# Patient Record
Sex: Male | Born: 1955
Health system: Southern US, Community
[De-identification: ages and names within clinical notes are randomized; demographics above are authoritative.]

## PROBLEM LIST (undated history)

## (undated) DIAGNOSIS — K635 Polyp of colon: Secondary | ICD-10-CM

## (undated) DIAGNOSIS — I10 Essential (primary) hypertension: Secondary | ICD-10-CM

## (undated) DIAGNOSIS — C20 Malignant neoplasm of rectum: Secondary | ICD-10-CM

## (undated) DIAGNOSIS — K219 Gastro-esophageal reflux disease without esophagitis: Secondary | ICD-10-CM

## (undated) DIAGNOSIS — E119 Type 2 diabetes mellitus without complications: Secondary | ICD-10-CM

## (undated) HISTORY — DX: Polyp of colon: K63.5

## (undated) HISTORY — DX: Malignant neoplasm of rectum: C20

## (undated) HISTORY — PX: OTHER SURGICAL HISTORY: SHX169

---

## 2001-10-13 ENCOUNTER — Emergency Department (HOSPITAL_COMMUNITY): Admission: EM | Admit: 2001-10-13 | Discharge: 2001-10-13 | Payer: Self-pay | Admitting: *Deleted

## 2004-11-30 ENCOUNTER — Emergency Department: Payer: Self-pay | Admitting: Emergency Medicine

## 2008-06-13 ENCOUNTER — Emergency Department: Payer: Self-pay | Admitting: Unknown Physician Specialty

## 2009-10-26 ENCOUNTER — Emergency Department: Payer: Self-pay | Admitting: Emergency Medicine

## 2010-03-02 ENCOUNTER — Emergency Department: Payer: Self-pay | Admitting: Emergency Medicine

## 2010-08-25 ENCOUNTER — Emergency Department: Payer: Self-pay | Admitting: Emergency Medicine

## 2011-02-09 ENCOUNTER — Emergency Department: Payer: Self-pay | Admitting: Unknown Physician Specialty

## 2011-10-10 ENCOUNTER — Emergency Department: Payer: Self-pay | Admitting: Emergency Medicine

## 2011-10-10 LAB — CBC
HCT: 46.9 % (ref 40.0–52.0)
MCH: 27.6 pg (ref 26.0–34.0)
MCHC: 31.9 g/dL — ABNORMAL LOW (ref 32.0–36.0)
MCV: 87 fL (ref 80–100)
Platelet: 159 10*3/uL (ref 150–440)
RDW: 14.3 % (ref 11.5–14.5)
WBC: 7 10*3/uL (ref 3.8–10.6)

## 2011-10-10 LAB — BASIC METABOLIC PANEL
Anion Gap: 5 — ABNORMAL LOW (ref 7–16)
Calcium, Total: 9.2 mg/dL (ref 8.5–10.1)
Co2: 35 mmol/L — ABNORMAL HIGH (ref 21–32)
EGFR (African American): >60
EGFR (Non-African Amer.): 55 — ABNORMAL LOW
Glucose: 106 mg/dL — ABNORMAL HIGH (ref 65–99)
Osmolality: 289 (ref 275–301)
Potassium: 4.3 mmol/L (ref 3.5–5.1)

## 2011-10-10 LAB — TROPONIN I: Troponin-I: <0.02 ng/mL

## 2014-09-20 ENCOUNTER — Encounter: Payer: Self-pay | Admitting: *Deleted

## 2014-09-21 ENCOUNTER — Encounter
Admission: RE | Disposition: A | Discharge status: Home / Self Care | Payer: Self-pay | Source: Ambulatory Visit | Attending: Gastroenterology

## 2014-09-21 ENCOUNTER — Ambulatory Visit
Admission: RE | Admit: 2014-09-21 | Discharge: 2014-09-21 | Disposition: A | Discharge status: Home / Self Care | Payer: PRIVATE HEALTH INSURANCE | Source: Ambulatory Visit | Attending: Gastroenterology | Admitting: Gastroenterology

## 2014-09-21 ENCOUNTER — Ambulatory Visit: Payer: PRIVATE HEALTH INSURANCE | Admitting: Anesthesiology

## 2014-09-21 ENCOUNTER — Encounter: Payer: Self-pay | Admitting: *Deleted

## 2014-09-21 DIAGNOSIS — Z1211 Encounter for screening for malignant neoplasm of colon: Secondary | ICD-10-CM | POA: Diagnosis not present

## 2014-09-21 DIAGNOSIS — B9681 Helicobacter pylori [H. pylori] as the cause of diseases classified elsewhere: Secondary | ICD-10-CM | POA: Diagnosis not present

## 2014-09-21 DIAGNOSIS — D123 Benign neoplasm of transverse colon: Secondary | ICD-10-CM | POA: Insufficient documentation

## 2014-09-21 DIAGNOSIS — I10 Essential (primary) hypertension: Secondary | ICD-10-CM | POA: Diagnosis not present

## 2014-09-21 DIAGNOSIS — K219 Gastro-esophageal reflux disease without esophagitis: Secondary | ICD-10-CM | POA: Diagnosis not present

## 2014-09-21 DIAGNOSIS — F17210 Nicotine dependence, cigarettes, uncomplicated: Secondary | ICD-10-CM | POA: Insufficient documentation

## 2014-09-21 DIAGNOSIS — K2950 Unspecified chronic gastritis without bleeding: Secondary | ICD-10-CM | POA: Diagnosis not present

## 2014-09-21 DIAGNOSIS — R1013 Epigastric pain: Secondary | ICD-10-CM | POA: Diagnosis present

## 2014-09-21 HISTORY — PX: ESOPHAGOGASTRODUODENOSCOPY: SHX5428

## 2014-09-21 HISTORY — PX: COLONOSCOPY WITH PROPOFOL: SHX5780

## 2014-09-21 HISTORY — DX: Essential (primary) hypertension: I10

## 2014-09-21 HISTORY — DX: Gastro-esophageal reflux disease without esophagitis: K21.9

## 2014-09-21 SURGERY — COLONOSCOPY WITH PROPOFOL
Anesthesia: General

## 2014-09-21 MED ORDER — PROPOFOL INFUSION 10 MG/ML OPTIME
INTRAVENOUS | Status: DC | PRN
  Administered 2014-09-21: 140 ug/kg/min via INTRAVENOUS

## 2014-09-21 MED ORDER — PROPOFOL 10 MG/ML IV BOLUS
INTRAVENOUS | Status: DC | PRN
  Administered 2014-09-21: 30 mg via INTRAVENOUS
  Administered 2014-09-21: 70 mg via INTRAVENOUS

## 2014-09-21 MED ORDER — MIDAZOLAM HCL 5 MG/5ML IJ SOLN
INTRAMUSCULAR | Status: DC | PRN
  Administered 2014-09-21: 1 mg via INTRAVENOUS

## 2014-09-21 MED ORDER — SODIUM CHLORIDE 0.9 % IV SOLN
INTRAVENOUS | Status: DC
  Administered 2014-09-21: 11:00:00 via INTRAVENOUS
  Administered 2014-09-21: 1000 mL via INTRAVENOUS

## 2014-09-21 MED ORDER — FENTANYL CITRATE (PF) 100 MCG/2ML IJ SOLN
INTRAMUSCULAR | Status: DC | PRN
  Administered 2014-09-21: 50 ug via INTRAVENOUS

## 2014-09-21 MED ORDER — LIDOCAINE HCL (CARDIAC) 20 MG/ML IV SOLN
INTRAVENOUS | Status: DC | PRN
  Administered 2014-09-21: 100 mg via INTRAVENOUS

## 2014-09-21 MED ORDER — SODIUM CHLORIDE 0.9 % IV SOLN: INTRAVENOUS | Status: DC

## 2014-09-21 NOTE — Anesthesia Postprocedure Evaluation (Signed)
  Anesthesia Post-op Note  Patient: Anthony Bell  Procedure(s) Performed: Procedure(s): COLONOSCOPY WITH PROPOFOL (N/A) ESOPHAGOGASTRODUODENOSCOPY (EGD) (N/A)  Anesthesia type:General  Patient location: PACU  Post pain: Pain level controlled  Post assessment: Post-op Vital signs reviewed, Patient's Cardiovascular Status Stable, Respiratory Function Stable, Patent Airway and No signs of Nausea or vomiting  Post vital signs: Reviewed and stable  Last Vitals:  Filed Vitals:   09/21/14 1237  BP: 105/63  Pulse: 59  Temp: 35.6 C  Resp: 13    Level of consciousness: awake, alert  and patient cooperative  Complications: No apparent anesthesia complications

## 2014-09-21 NOTE — Op Note (Signed)
[facility] Gastroenterology Patient Name: Anthony Bell Procedure Date: 09/21/2014 11:21 AM MRN: [id number] Account #: [id number] Date of Birth: Oct 26, 1955 Admit Type: Outpatient Age: 59 Room: [facility] ENDO ROOM 3 Gender: Male Note Status: Finalized Procedure:         Colonoscopy Indications:       Screening for colorectal malignant neoplasm, This is the                     patient's first colonoscopy, Incidental - Rectal bleeding Providers:         Lollie Sails, MD Referring MD:      Mikeal Hawthorne. Brynda Greathouse, MD (Referring MD) Medicines:         Monitored Anesthesia Care Complications:     No immediate complications. Procedure:         Pre-Anesthesia Assessment:                    - ASA Grade Assessment: II - A patient with mild systemic                     disease.                    After obtaining informed consent, the colonoscope was                     passed under direct vision. Throughout the procedure, the                     patient's blood pressure, pulse, and oxygen saturations                     were monitored continuously. The Colonoscope was                     introduced through the anus and advanced to the the cecum,                     identified by appendiceal orifice and ileocecal valve. The                     colonoscopy was performed with moderate difficulty due to                     a tortuous colon. Successful completion of the procedure                     was aided by changing the patient to a prone position and                     using manual pressure. The patient tolerated the procedure                     well. The quality of the bowel preparation was good except                     the ascending colon was fair. Findings:      A polypoid partially obstructing medium-sized mass was found in the       rectum. The mass was non-circumferential. The mass measured three cm in       length. Biopsies were taken with a cold forceps for  histology.      A 2 mm polyp was found at the hepatic flexure. The polyp  was flat. The       polyp was removed with a cold biopsy forceps. Resection and retrieval       were complete.      Non-bleeding internal hemorrhoids were found during retroflexion. The       hemorrhoids were small.      The exam was otherwise without abnormality. Impression:        - Rule out malignancy, partially obstructing tumor in the                     rectum. Biopsied. Biopsied.                    - One 2 mm polyp at the hepatic flexure. Resected and                     retrieved.                    - Non-bleeding internal hemorrhoids. Recommendation:    - Await pathology results. Procedure Code(s): --- Professional ---                    [phone], Colonoscopy, flexible; with biopsy, single or                     multiple Diagnosis Code(s): --- Professional ---                    V76.51, Special screening for malignant neoplasms of colon                    239.0, Neoplasm of unspecified nature of digestive system                    211.3, Benign neoplasm of colon                    455.0, Internal hemorrhoids without mention of complication CPT copyright 2014 American Medical Association. All rights reserved. The codes documented in this report are preliminary and upon coder review may  be revised to meet current compliance requirements. Lollie Sails, MD 09/21/2014 12:34:00 PM This report has been signed electronically. Number of Addenda: 0 Note Initiated On: 09/21/2014 11:21 AM Scope Withdrawal Time: 0 hours 16 minutes 11 seconds  Total Procedure Duration: 0 hours 35 minutes 39 seconds       [facility]

## 2014-09-21 NOTE — Transfer of Care (Signed)
Immediate Anesthesia Transfer of Care Note  Patient: Anthony Bell  Procedure(s) Performed: Procedure(s): COLONOSCOPY WITH PROPOFOL (N/A) ESOPHAGOGASTRODUODENOSCOPY (EGD) (N/A)  Patient Location: PACU  Anesthesia Type:General  Level of Consciousness: sedated  Airway & Oxygen Therapy: Patient Spontanous Breathing and Patient connected to nasal cannula oxygen  Post-op Assessment: Report given to RN and Post -op Vital signs reviewed and stable  Post vital signs:   Last Vitals:  Filed Vitals:   09/21/14 0953  BP: 137/69  Pulse: 65  Temp: 36.1 C  Resp: 16    Complications: No apparent anesthesia complications

## 2014-09-21 NOTE — Anesthesia Preprocedure Evaluation (Addendum)
Anesthesia Evaluation  Patient identified by MRN, date of birth, ID band Patient awake    Reviewed: Allergy & Precautions, NPO status , Patient's Chart, lab work & pertinent test results  Airway Mallampati: II       Dental  (+) Poor Dentition   Pulmonary Current Smoker,  breath sounds clear to auscultation  Pulmonary exam normal       Cardiovascular hypertension, Normal cardiovascular exam    Neuro/Psych negative neurological ROS  negative psych ROS   GI/Hepatic Neg liver ROS, GERD-  Medicated and Controlled,  Endo/Other  negative endocrine ROS  Renal/GU negative Renal ROS  negative genitourinary   Musculoskeletal negative musculoskeletal ROS (+)   Abdominal Normal abdominal exam  (+)   Peds negative pediatric ROS (+)  Hematology negative hematology ROS (+)   Anesthesia Other Findings   Reproductive/Obstetrics                            Anesthesia Physical Anesthesia Plan  ASA: II  Anesthesia Plan: General   Post-op Pain Management:    Induction: Intravenous  Airway Management Planned: Nasal Cannula  Additional Equipment:   Intra-op Plan:   Post-operative Plan:   Informed Consent: I have reviewed the patients History and Physical, chart, labs and discussed the procedure including the risks, benefits and alternatives for the proposed anesthesia with the patient or authorized representative who has indicated his/her understanding and acceptance.   Dental advisory given  Plan Discussed with: CRNA and Surgeon  Anesthesia Plan Comments:         Anesthesia Quick Evaluation

## 2014-09-21 NOTE — Op Note (Signed)
[facility] Gastroenterology Patient Name: Anthony Bell Procedure Date: 09/21/2014 11:21 AM MRN: [id number] Account #: [id number] Date of Birth: August 24, 1955 Admit Type: Outpatient Age: 59 Room: [facility] ENDO ROOM 3 Gender: Male Note Status: Finalized Procedure:         Upper GI endoscopy Indications:       Epigastric abdominal pain Providers:         Lollie Sails, MD Referring MD:      Mikeal Hawthorne. Brynda Greathouse, MD (Referring MD) Medicines:         Monitored Anesthesia Care Complications:     No immediate complications. Procedure:         Pre-Anesthesia Assessment:                    - ASA Grade Assessment: II - A patient with mild systemic                     disease.                    After obtaining informed consent, the endoscope was passed                     under direct vision. Throughout the procedure, the                     patient's blood pressure, pulse, and oxygen saturations                     were monitored continuously. The Olympus GIF-160 endoscope                     (S#. [id number]) was introduced through the mouth, and                     advanced to the third part of duodenum. The upper GI                     endoscopy was accomplished without difficulty. The patient                     tolerated the procedure well. Findings:      The Z-line was variable. Biopsies were taken with a cold forceps for       histology.      Diffuse moderate inflammation characterized by congestion (edema) and       erythema was found in the gastric body. Biopsies were taken with a cold       forceps for histology. Biopsies were taken with a cold forceps for       Helicobacter pylori testing.      Scattered moderate inflammation characterized by congestion (edema),       erosions and erythema was found at the incisura, in the gastric antrum       and in the prepyloric region of the stomach. Biopsies were taken with a       cold forceps for histology. Biopsies were taken  with a cold forceps for       Helicobacter pylori testing.      The cardia and gastric fundus were otherwise normal on retroflexion.      The examined duodenum was normal.      Atypical pigmentation in the opening of the larynx, covering one       arytenoid. Impression:        -  Z-line variable. Biopsied.                    - Gastritis. Biopsied.                    - Gastritis. Biopsied.                    - Normal examined duodenum. Recommendation:    - Await pathology results.                    - Use Protonix (pantoprazole) 40 mg PO daily.                    - Return to GI clinic in 6 weeks.                    - Refer to an ENT specialist at appointment to be                     scheduled. Procedure Code(s): --- Professional ---                    [phone], Esophagogastroduodenoscopy, flexible, transoral;                     with biopsy, single or multiple Diagnosis Code(s): --- Professional ---                    530.89, Other specified disorders of esophagus                    535.50, Unspecified gastritis and gastroduodenitis,                     without mention of hemorrhage                    789.06, Abdominal pain, epigastric CPT copyright 2014 American Medical Association. All rights reserved. The codes documented in this report are preliminary and upon coder review may  be revised to meet current compliance requirements. Lollie Sails, MD 09/21/2014 11:48:32 AM This report has been signed electronically. Number of Addenda: 0 Note Initiated On: 09/21/2014 11:21 AM      [facility]

## 2014-09-21 NOTE — Anesthesia Procedure Notes (Signed)
Date/Time: 09/21/2014 11:40 AM Performed by: Allean Found Pre-anesthesia Checklist: Patient identified, Emergency Drugs available, Suction available, Patient being monitored and Timeout performed Oxygen Delivery Method: Nasal cannula

## 2014-09-21 NOTE — H&P (Signed)
Outpatient short stay form Pre-procedure 09/21/2014 10:33 AM Lollie Sails MD  Primary Physician: Dr. Nicky Pugh  Reason for visit:  EGD and colonoscopy  History of present illness:  Patient is a 59 year old male who is presenting today for a screening colonoscopy and an EGD in regards to epigastric pain. He states he was started on some omeprazole which she has been taking and this seems to be of some benefit. There is no nausea or vomiting. He has seen some rectal bleeding intermittently. He has never had a colonoscopy before. He tolerated his prep well. He does not take any blood thinner or aspirin.    Current facility-administered medications:  .  0.9 %  sodium chloride infusion, , Intravenous, Continuous, Lollie Sails, MD, Last Rate: 50 mL/hr at 09/21/14 1012, 1,000 mL at 09/21/14 1012 .  0.9 %  sodium chloride infusion, , Intravenous, Continuous, Lollie Sails, MD  Prescriptions prior to admission  Medication Sig Dispense Refill Last Dose  . lisinopril-hydrochlorothiazide (PRINZIDE,ZESTORETIC) 10-12.5 MG per tablet Take 1 tablet by mouth daily.     . sucralfate (CARAFATE) 1 G tablet Take 1 g by mouth 4 (four) times daily -  with meals and at bedtime.        No Known Allergies   Past Medical History  Diagnosis Date  . Hypertension   . GERD (gastroesophageal reflux disease)     Review of systems:      Physical Exam    Heart and lungs: Regular rate and rhythm without rub or gallop lungs are bilaterally clear    HEENT: Norm cephalic atraumatic eyes are anicteric    Other:     Pertinant exam for procedure: Slight protuberant bowel sounds are positive normoactive nontender nondistended area    Planned proceedures: EGD and colonoscopy with indicated procedures I have discussed the risks benefits and complications of procedures to include not limited to bleeding, infection, perforation and the risk of sedation and the patient wishes to proceed.    Lollie Sails, MD Gastroenterology 09/21/2014  10:33 AM

## 2014-09-22 ENCOUNTER — Encounter: Payer: Self-pay | Admitting: Gastroenterology

## 2014-09-22 LAB — SURGICAL PATHOLOGY

## 2014-10-18 ENCOUNTER — Telehealth: Payer: Self-pay

## 2014-10-18 NOTE — Telephone Encounter (Signed)
  Oncology Nurse Navigator Documentation  Referral date to RadOnc/MedOnc: 10/18/14 (10/18/14 1400) Navigator Encounter Type: Telephone (10/18/14 1400)               Spoke with Anthony Bell on the Phone. EUS scheduled for 7/28. Instructed on prep as to be ordered by Dr Durenda Age office. Provided number to call to find out arrival time. Denies any anticoagulant use. Instructed to take any heart or blood pressure medication as well as any seizure or parkinsons medication at 0600 with a sip of water. Instructed on no food after midnight. Can have clear liquids in the am with nothing 5 hours before procedure, only if afternoon procedure otherwise nothing to eat or drink after midnight. Instructed must have a driver for this procedure.These instructions were also mailed to home address after verification. Readback performed

## 2014-10-19 NOTE — Progress Notes (Signed)
  Oncology Nurse Navigator Documentation                    Per Dr Mont Dutton, rectal polyp is to large for EUS. Patient will likely need ESD procedure performed at [facility]. EUS to be cancelled at [facility] for 7/28. Endo notified. Pt notified that EUS cancelled and that [facility] will be contacting him to arrange appt for procedure there. Rhonda at Dr Durenda Age office notified.

## 2014-10-21 ENCOUNTER — Encounter: Admission: RE | Payer: Self-pay | Source: Ambulatory Visit

## 2014-10-21 ENCOUNTER — Ambulatory Visit
Admission: RE | Admit: 2014-10-21 | Payer: PRIVATE HEALTH INSURANCE | Source: Ambulatory Visit | Admitting: Internal Medicine

## 2014-10-21 SURGERY — ULTRASOUND, LOWER GI TRACT, ENDOSCOPIC
Anesthesia: General

## 2014-11-08 DIAGNOSIS — D128 Benign neoplasm of rectum: Secondary | ICD-10-CM | POA: Insufficient documentation

## 2014-12-07 DIAGNOSIS — K219 Gastro-esophageal reflux disease without esophagitis: Secondary | ICD-10-CM | POA: Insufficient documentation

## 2014-12-07 DIAGNOSIS — Z720 Tobacco use: Secondary | ICD-10-CM | POA: Insufficient documentation

## 2014-12-07 DIAGNOSIS — C20 Malignant neoplasm of rectum: Secondary | ICD-10-CM | POA: Insufficient documentation

## 2014-12-07 DIAGNOSIS — I10 Essential (primary) hypertension: Secondary | ICD-10-CM | POA: Insufficient documentation

## 2014-12-07 DIAGNOSIS — Z85048 Personal history of other malignant neoplasm of rectum, rectosigmoid junction, and anus: Secondary | ICD-10-CM | POA: Insufficient documentation

## 2015-06-01 ENCOUNTER — Emergency Department: Payer: PRIVATE HEALTH INSURANCE

## 2015-06-01 ENCOUNTER — Emergency Department
Admission: EM | Admit: 2015-06-01 | Discharge: 2015-06-01 | Disposition: A | Discharge status: Home / Self Care | Payer: PRIVATE HEALTH INSURANCE | Attending: Emergency Medicine | Admitting: Emergency Medicine

## 2015-06-01 ENCOUNTER — Encounter: Payer: Self-pay | Admitting: Emergency Medicine

## 2015-06-01 DIAGNOSIS — R55 Syncope and collapse: Secondary | ICD-10-CM

## 2015-06-01 DIAGNOSIS — I10 Essential (primary) hypertension: Secondary | ICD-10-CM | POA: Diagnosis not present

## 2015-06-01 DIAGNOSIS — F17210 Nicotine dependence, cigarettes, uncomplicated: Secondary | ICD-10-CM | POA: Insufficient documentation

## 2015-06-01 DIAGNOSIS — Z79899 Other long term (current) drug therapy: Secondary | ICD-10-CM | POA: Diagnosis not present

## 2015-06-01 LAB — BASIC METABOLIC PANEL
ANION GAP: 6 (ref 5–15)
BUN: 18 mg/dL (ref 6–20)
CO2: 33 mmol/L — AB (ref 22–32)
Calcium: 8.7 mg/dL — ABNORMAL LOW (ref 8.9–10.3)
Chloride: 101 mmol/L (ref 101–111)
Creatinine, Ser: 1.49 mg/dL — ABNORMAL HIGH (ref 0.61–1.24)
GFR calc Af Amer: 58 mL/min — ABNORMAL LOW (ref >60)
GFR calc non Af Amer: 50 mL/min — ABNORMAL LOW (ref >60)
GLUCOSE: 111 mg/dL — AB (ref 65–99)
POTASSIUM: 3.9 mmol/L (ref 3.5–5.1)
Sodium: 140 mmol/L (ref 135–145)

## 2015-06-01 LAB — CBC
HCT: 45.7 % (ref 40.0–52.0)
HEMOGLOBIN: 15.2 g/dL (ref 13.0–18.0)
MCH: 27.2 pg (ref 26.0–34.0)
MCHC: 33.2 g/dL (ref 32.0–36.0)
MCV: 81.7 fL (ref 80.0–100.0)
Platelets: 142 10*3/uL — ABNORMAL LOW (ref 150–440)
RBC: 5.59 MIL/uL (ref 4.40–5.90)
RDW: 15.5 % — ABNORMAL HIGH (ref 11.5–14.5)
WBC: 3.7 10*3/uL — ABNORMAL LOW (ref 3.8–10.6)

## 2015-06-01 LAB — URINALYSIS COMPLETE WITH MICROSCOPIC (ARMC ONLY)
Bacteria, UA: NONE SEEN
Bilirubin Urine: NEGATIVE
Glucose, UA: NEGATIVE mg/dL
Ketones, ur: NEGATIVE mg/dL
Leukocytes, UA: NEGATIVE
Nitrite: NEGATIVE
Protein, ur: NEGATIVE mg/dL
Specific Gravity, Urine: 1.02 (ref 1.005–1.030)
WBC, UA: NONE SEEN WBC/hpf (ref 0–5)
pH: 6 (ref 5.0–8.0)

## 2015-06-01 LAB — HEPATIC FUNCTION PANEL
ALT: 26 U/L (ref 17–63)
AST: 30 U/L (ref 15–41)
Albumin: 3.6 g/dL (ref 3.5–5.0)
Alkaline Phosphatase: 55 U/L (ref 38–126)
Bilirubin, Direct: 0.1 mg/dL (ref 0.1–0.5)
Indirect Bilirubin: 0.1 mg/dL — ABNORMAL LOW (ref 0.3–0.9)
Total Bilirubin: 0.2 mg/dL — ABNORMAL LOW (ref 0.3–1.2)
Total Protein: 7.2 g/dL (ref 6.5–8.1)

## 2015-06-01 LAB — CK: CK TOTAL: 316 U/L (ref 49–397)

## 2015-06-01 LAB — TROPONIN I
Troponin I: <0.03 ng/mL
Troponin I: <0.03 ng/mL

## 2015-06-01 NOTE — Discharge Instructions (Signed)
Syncope °Syncope is a medical term for fainting or passing out. This means you lose consciousness and drop to the ground. People are generally unconscious for less than 5 minutes. You may have some muscle twitches for up to 15 seconds before waking up and returning to normal. Syncope occurs more often in older adults, but it can happen to anyone. While most causes of syncope are not dangerous, syncope can be a sign of a serious medical problem. It is important to seek medical care.  °CAUSES  °Syncope is caused by a sudden drop in blood flow to the brain. The specific cause is often not determined. Factors that can bring on syncope include: °· Taking medicines that lower blood pressure. °· Sudden changes in posture, such as standing up quickly. °· Taking more medicine than prescribed. °· Standing in one place for too long. °· Seizure disorders. °· Dehydration and excessive exposure to heat. °· Low blood sugar (hypoglycemia). °· Straining to have a bowel movement. °· Heart disease, irregular heartbeat, or other circulatory problems. °· Fear, emotional distress, seeing blood, or severe pain. °SYMPTOMS  °Right before fainting, you may: °· Feel dizzy or light-headed. °· Feel nauseous. °· See all white or all black in your field of vision. °· Have cold, clammy skin. °DIAGNOSIS  °Your health care provider will ask about your symptoms, perform a physical exam, and perform an electrocardiogram (ECG) to record the electrical activity of your heart. Your health care provider may also perform other heart or blood tests to determine the cause of your syncope which may include: °· Transthoracic echocardiogram (TTE). During echocardiography, sound waves are used to evaluate how blood flows through your heart. °· Transesophageal echocardiogram (TEE). °· Cardiac monitoring. This allows your health care provider to monitor your heart rate and rhythm in real time. °· Holter monitor. This is a portable device that records your  heartbeat and can help diagnose heart arrhythmias. It allows your health care provider to track your heart activity for several days, if needed. °· Stress tests by exercise or by giving medicine that makes the heart beat faster. °TREATMENT  °In most cases, no treatment is needed. Depending on the cause of your syncope, your health care provider may recommend changing or stopping some of your medicines. °HOME CARE INSTRUCTIONS °· Have someone stay with you until you feel stable. °· Do not drive, use machinery, or play sports until your health care provider says it is okay. °· Keep all follow-up appointments as directed by your health care provider. °· Lie down right away if you start feeling like you might faint. Breathe deeply and steadily. Wait until all the symptoms have passed. °· Drink enough fluids to keep your urine clear or pale yellow. °· If you are taking blood pressure or heart medicine, get up slowly and take several minutes to sit and then stand. This can reduce dizziness. °SEEK IMMEDIATE MEDICAL CARE IF:  °· You have a severe headache. °· You have unusual pain in the chest, abdomen, or back. °· You are bleeding from your mouth or rectum, or you have black or tarry stool. °· You have an irregular or very fast heartbeat. °· You have pain with breathing. °· You have repeated fainting or seizure-like jerking during an episode. °· You faint when sitting or lying down. °· You have confusion. °· You have trouble walking. °· You have severe weakness. °· You have vision problems. °If you fainted, call your local emergency services (911 in [place] Do not drive   yourself to the hospital.    This information is not intended to replace advice given to you by your health care provider. Make sure you discuss any questions you have with your health care provider.   Document Released: 03/12/2005 Document Revised: 07/27/2014 Document Reviewed: 05/11/2011 Elsevier Interactive Patient Education [employer].   Please rest and take it easy today. Call Dr. Argie Ramming and the cardiologist tomorrow. He should be up to get you in the office within a day or 2 for further evaluation. Please return here if you feel worse at all including fever nausea vomiting or lightheadedness or if you pass out again.

## 2015-06-01 NOTE — ED Notes (Signed)
Spoke with Kristian Covey, supervisor, for clarity. Anthony Bell stated that there is no need to file for workers compensation; pt was not injured at work only became dizzy.

## 2015-06-01 NOTE — ED Provider Notes (Signed)
[facility] Emergency Department Provider Note  ____________________________________________  Time seen: Approximately 10:34 AM  I have reviewed the triage vital signs and the nursing notes.   HISTORY  Chief Complaint Loss of Consciousness    HPI Anthony Bell is a 60 y.o. male  patient has a history of surgery for rectal cancer and is getting checked every 3 months. He reports beginning feeling bad and having low back pain in both hips hurting a little over a week ago. Then he had some chest pain and the cold with a little bit of coughing and coughed up blood once. Then he developed a headache the top of his head . Today patient was at work started to feel hot and sweaty. His head on the table and woke up on the floor. He reports he is feeling okay now except his hips and legs are hurting and achy. Moving and does not make them worse. He has no abdominal pain and no low back pain and dysuria just his hips and legs ache. He has not been running a fever. He is not coughing anymore.  Past Medical History  Diagnosis Date  . Hypertension   . GERD (gastroesophageal reflux disease)     There are no active problems to display for this patient.   Past Surgical History  Procedure Laterality Date  . Lymphnode removed    . Colonoscopy with propofol N/A 09/21/2014    Procedure: COLONOSCOPY WITH PROPOFOL;  Surgeon: Lollie Sails, MD;  Location: [facility] ENDOSCOPY;  Service: Endoscopy;  Laterality: N/A;  . Esophagogastroduodenoscopy N/A 09/21/2014    Procedure: ESOPHAGOGASTRODUODENOSCOPY (EGD);  Surgeon: Lollie Sails, MD;  Location: [facility] ENDOSCOPY;  Service: Endoscopy;  Laterality: N/A;    Current Outpatient Rx  Name  Route  Sig  Dispense  Refill  . esomeprazole (NEXIUM) 20 MG capsule   Oral   Take 20 mg by mouth daily.          Marland Kitchen lisinopril-hydrochlorothiazide (PRINZIDE,ZESTORETIC) 10-12.5 MG per tablet   Oral   Take 1 tablet by mouth daily.         .  naproxen sodium (ANAPROX) 220 MG tablet   Oral   Take 220-440 mg by mouth 3 (three) times daily as needed (for pain).            Allergies Review of patient's allergies indicates no known allergies.  No family history on file.  Social History Social History  Substance Use Topics  . Smoking status: Current Every Day Smoker -- 0.50 packs/day    Types: Cigarettes  . Smokeless tobacco: Never Used  . Alcohol Use: No    Review of Systems Constitutional: No fever/chills Eyes: No visual changes. ENT: No sore throat. Cardiovascular: Denies chest pain. Respiratory: Denies shortness of breath. Gastrointestinal: No abdominal pain.  No nausea, no vomiting.  No diarrhea.  No constipation. Genitourinary: Negative for dysuria. Musculoskeletal: Negative for back pain. Skin: Negative for rash. Neurological: Negative for headaches, focal weakness or numbness.  10-point ROS otherwise negative.  ____________________________________________   PHYSICAL EXAM:  VITAL SIGNS: ED Triage Vitals  Enc Vitals Group     BP 06/01/15 0955 128/66 mmHg     Pulse Rate 06/01/15 0955 78     Resp 06/01/15 0955 18     Temp 06/01/15 0955 98.6 F (37 C)     Temp Source 06/01/15 0955 Oral     SpO2 06/01/15 0955 99 %     Weight 06/01/15 0955 195 lb (88.451 kg)  Height 06/01/15 0955 5\' 8"  (1.727 m)     Head Cir --      Peak Flow --      Pain Score 06/01/15 0955 8     Pain Loc --      Pain Edu? --      Excl. in [place]? --     Constitutional: Alert and oriented. Well appearing and in no acute distress. Eyes: Conjunctivae are normal. PERRL. EOMI. Head: Atraumatic. Nose: No congestion/rhinnorhea. Mouth/Throat: Mucous membranes are moist.  Oropharynx non-erythematous. Neck: No stridor.  Cardiovascular: Normal rate, regular rhythm. Grossly normal heart sounds.  Good peripheral circulation. Respiratory: Normal respiratory effort.  No retractions. Lungs CTAB. Gastrointestinal: Soft and nontender. No  distention. No abdominal bruits. No CVA tenderness. Musculoskeletal: No lower extremity tenderness nor edema.  No joint effusions. Neurologic:  Normal speech and language. No gross focal neurologic deficits are appreciated. No gait instability. Skin:  Skin is warm, dry and intact. No rash noted. Psychiatric: Mood and affect are normal. Speech and behavior are normal.  ____________________________________________   LABS (all labs ordered are listed, but only abnormal results are displayed)  Labs Reviewed  BASIC METABOLIC PANEL - Abnormal; Notable for the following:    CO2 33 (*)    Glucose, Bld 111 (*)    Creatinine, Ser 1.49 (*)    Calcium 8.7 (*)    GFR calc non Af Amer 50 (*)    GFR calc Af Amer 58 (*)    All other components within normal limits  CBC - Abnormal; Notable for the following:    WBC 3.7 (*)    RDW 15.5 (*)    Platelets 142 (*)    All other components within normal limits  URINALYSIS COMPLETEWITH MICROSCOPIC ([facility] ONLY) - Abnormal; Notable for the following:    Color, Urine YELLOW (*)    APPearance CLEAR (*)    Hgb urine dipstick 1+ (*)    Squamous Epithelial / LPF 0-5 (*)    All other components within normal limits  HEPATIC FUNCTION PANEL - Abnormal; Notable for the following:    Total Bilirubin 0.2 (*)    Indirect Bilirubin 0.1 (*)    All other components within normal limits  TROPONIN I  CK  TROPONIN I   ____________________________________________  [facility] read and interpreted by me shows normal sinus rhythm rate of 72 normal axis patient has some T-wave inversion in lead 3 slight ST-T irregularity in lead 2 and some T-wave inversion also in lead 456. These are present in previous EKGs. ____________________________________________  RADIOLOGY  Chest x-ray read by radiology as negative Pelvis x-ray read by radiology as only mild arthritis. CT of the head read as no acute disease by  radiology  ____________________________________________   PROCEDURES    ____________________________________________   INITIAL IMPRESSION / ASSESSMENT AND PLAN / ED COURSE  Pertinent labs & imaging results that were available during my care of the patient were reviewed by me and considered in my medical decision making (see chart for details).   ____________________________________________   FINAL CLINICAL IMPRESSION(S) / ED DIAGNOSES  Final diagnoses:  Syncope, unspecified syncope type      Nena Polio, MD 06/03/15 0040

## 2015-06-01 NOTE — ED Notes (Signed)
Pt was at work this am, states he was at his desk, put his head down because he started feeling hot, next thing he remembers was waking up on the floor. States pain to top of head, however, that began Saturday. Denies hitting his head that he knows of. Also states his legs have been hurting him bilaterally for a few weeks now. Here via EMS. They stated that the pt said he did not pass out when they arrived.

## 2016-08-18 ENCOUNTER — Encounter: Payer: Self-pay | Admitting: Emergency Medicine

## 2016-08-18 ENCOUNTER — Emergency Department
Admission: EM | Admit: 2016-08-18 | Discharge: 2016-08-18 | Disposition: A | Discharge status: Home / Self Care | Payer: No Typology Code available for payment source | Attending: Emergency Medicine | Admitting: Emergency Medicine

## 2016-08-18 DIAGNOSIS — Z79899 Other long term (current) drug therapy: Secondary | ICD-10-CM | POA: Diagnosis not present

## 2016-08-18 DIAGNOSIS — R739 Hyperglycemia, unspecified: Secondary | ICD-10-CM | POA: Diagnosis present

## 2016-08-18 DIAGNOSIS — I10 Essential (primary) hypertension: Secondary | ICD-10-CM | POA: Diagnosis not present

## 2016-08-18 DIAGNOSIS — F17210 Nicotine dependence, cigarettes, uncomplicated: Secondary | ICD-10-CM | POA: Insufficient documentation

## 2016-08-18 DIAGNOSIS — E119 Type 2 diabetes mellitus without complications: Secondary | ICD-10-CM | POA: Diagnosis not present

## 2016-08-18 DIAGNOSIS — E860 Dehydration: Secondary | ICD-10-CM | POA: Diagnosis not present

## 2016-08-18 LAB — URINALYSIS, COMPLETE (UACMP) WITH MICROSCOPIC
BACTERIA UA: NONE SEEN
BILIRUBIN URINE: NEGATIVE
Glucose, UA: >=500 mg/dL — AB
Ketones, ur: NEGATIVE mg/dL
Leukocytes, UA: NEGATIVE
NITRITE: NEGATIVE
PH: 6 (ref 5.0–8.0)
Protein, ur: NEGATIVE mg/dL
SPECIFIC GRAVITY, URINE: 1.025 (ref 1.005–1.030)
Squamous Epithelial / LPF: NONE SEEN

## 2016-08-18 LAB — CBC
HEMATOCRIT: 55.4 % — AB (ref 40.0–52.0)
Hemoglobin: 18.5 g/dL — ABNORMAL HIGH (ref 13.0–18.0)
MCH: 26.9 pg (ref 26.0–34.0)
MCHC: 33.4 g/dL (ref 32.0–36.0)
MCV: 80.7 fL (ref 80.0–100.0)
Platelets: 158 10*3/uL (ref 150–440)
RBC: 6.87 MIL/uL — ABNORMAL HIGH (ref 4.40–5.90)
RDW: 14.5 % (ref 11.5–14.5)
WBC: 8.7 10*3/uL (ref 3.8–10.6)

## 2016-08-18 LAB — BASIC METABOLIC PANEL
Anion gap: 12 (ref 5–15)
BUN: 18 mg/dL (ref 6–20)
CALCIUM: 10 mg/dL (ref 8.9–10.3)
CO2: 26 mmol/L (ref 22–32)
Chloride: 90 mmol/L — ABNORMAL LOW (ref 101–111)
Creatinine, Ser: 1.46 mg/dL — ABNORMAL HIGH (ref 0.61–1.24)
GFR calc Af Amer: 59 mL/min — ABNORMAL LOW (ref >60)
GFR, EST NON AFRICAN AMERICAN: 51 mL/min — AB (ref >60)
GLUCOSE: 563 mg/dL — AB (ref 65–99)
Potassium: 3.8 mmol/L (ref 3.5–5.1)
Sodium: 128 mmol/L — ABNORMAL LOW (ref 135–145)

## 2016-08-18 LAB — GLUCOSE, CAPILLARY
GLUCOSE-CAPILLARY: 576 mg/dL — AB (ref 65–99)
GLUCOSE-CAPILLARY: 431 mg/dL — AB (ref 65–99)

## 2016-08-18 LAB — BETA-HYDROXYBUTYRIC ACID: BETA-HYDROXYBUTYRIC ACID: 0.32 mmol/L — AB (ref 0.05–0.27)

## 2016-08-18 MED ORDER — METFORMIN HCL 500 MG PO TABS
500.0000 mg | ORAL_TABLET | Freq: Once | ORAL | Status: AC
  Administered 2016-08-18: 500 mg via ORAL
  Filled 2016-08-18: qty 1

## 2016-08-18 MED ORDER — SODIUM CHLORIDE 0.9 % IV BOLUS (SEPSIS)
1000.0000 mL | Freq: Once | INTRAVENOUS | Status: AC
  Administered 2016-08-18: 1000 mL via INTRAVENOUS

## 2016-08-18 MED ORDER — METFORMIN HCL 500 MG PO TABS: 500.0000 mg | ORAL_TABLET | Freq: Two times a day (BID) | ORAL | 0 refills | Status: DC

## 2016-08-18 NOTE — ED Triage Notes (Signed)
Pt ambulatory to triage with steady gait. Pt reports feeling dizzy with headache this afternoon, checked his blood sugar and reading at home was 554. Pt denies HX of DM. Pt taken to [facility].

## 2016-08-18 NOTE — ED Provider Notes (Signed)
[facility] Emergency Department Provider Note  ____________________________________________   First MD Initiated Contact with Patient 08/18/16 2030     (approximate)  I have reviewed the triage vital signs and the nursing notes.   HISTORY  Chief Complaint Hyperglycemia   HPI Robby Bulkley is a 61 y.o. male who self presents to the emergency department for hyperglycemia. He says that for the last 2 or 3 weeks he has had polyuria and polydipsia and is felt generally unwell. Today he called his mother who has diabetes mellitus because he wanted his blood sugar to be checked and when he got over there he noted it was 543 came to the emergency department. He has a remote past medical history of colon polyps, currently has hypertension and dyslipidemia. He last had blood drawn by a physician one year ago. His symptoms are progressive, constant, nothing seems to make them better or worse. He is getting up to urinate roughly 10 times at night now.   Past Medical History:  Diagnosis Date  . GERD (gastroesophageal reflux disease)   . Hypertension     There are no active problems to display for this patient.   Past Surgical History:  Procedure Laterality Date  . COLONOSCOPY WITH PROPOFOL N/A 09/21/2014   Procedure: COLONOSCOPY WITH PROPOFOL;  Surgeon: Lollie Sails, MD;  Location: [facility] ENDOSCOPY;  Service: Endoscopy;  Laterality: N/A;  . ESOPHAGOGASTRODUODENOSCOPY N/A 09/21/2014   Procedure: ESOPHAGOGASTRODUODENOSCOPY (EGD);  Surgeon: Lollie Sails, MD;  Location: [facility] ENDOSCOPY;  Service: Endoscopy;  Laterality: N/A;  . lymphnode removed      Prior to Admission medications   Medication Sig Start Date End Date Taking? Authorizing Provider  esomeprazole (NEXIUM) 20 MG capsule Take 20 mg by mouth daily.     [provider]  lisinopril-hydrochlorothiazide (PRINZIDE,ZESTORETIC) 10-12.5 MG per tablet Take 1 tablet by mouth daily.    [provider]  naproxen sodium (ANAPROX) 220 MG tablet Take 220-440 mg by mouth 3 (three) times daily as needed (for pain).     [provider]    Allergies Patient has no known allergies.  History reviewed. No pertinent family history.  Social History Social History  Substance Use Topics  . Smoking status: Current Every Day Smoker    Packs/day: 0.50    Types: Cigarettes  . Smokeless tobacco: Never Used  . Alcohol use No    Review of Systems Constitutional: No fever/chills Eyes: No visual changes. ENT: No sore throat. Cardiovascular: Denies chest pain. Respiratory: Denies shortness of breath. Gastrointestinal: No abdominal pain.  No nausea, no vomiting.  No diarrhea.  No constipation. Genitourinary: Negative for dysuria. Musculoskeletal: Negative for back pain. Skin: Negative for rash. Neurological: Negative for headaches, focal weakness or numbness.   ____________________________________________   PHYSICAL EXAM:  VITAL SIGNS: ED Triage Vitals [08/18/16 2024]  Enc Vitals Group     BP (!) 192/90     Pulse Rate 90     Resp 16     Temp 98.4 F (36.9 C)     Temp Source Oral     SpO2 99 %     Weight 195 lb (88.5 kg)     Height      Head Circumference      Peak Flow      Pain Score      Pain Loc      Pain Edu?      Excl. in [place]?     Constitutional: Alert and oriented x  4 well appearing nontoxic no diaphoresis speaks in full, clear sentences Eyes: PERRL EOMI. Head: Atraumatic. Nose: No congestion/rhinnorhea. Mouth/Throat: No trismus Neck: No stridor.   Cardiovascular: Normal rate, regular rhythm. Grossly normal heart sounds.  Good peripheral circulation. Respiratory: Normal respiratory effort.  No retractions. Lungs CTAB and moving good air Gastrointestinal: Soft nondistended nontender Musculoskeletal: No lower extremity edema   Neurologic:  Normal speech and language. No gross focal neurologic deficits are appreciated. Skin:  Skin is warm, dry  and intact. No rash noted. Psychiatric: Mood and affect are normal. Speech and behavior are normal.    ____________________________________________   DIFFERENTIAL  Diabetes mellitus, hyperglycemia, HHS, DKA, dehydration ____________________________________________   LABS (all labs ordered are listed, but only abnormal results are displayed)  Labs Reviewed  GLUCOSE, CAPILLARY - Abnormal; Notable for the following:       Result Value   Glucose-Capillary >600 (*)    All other components within normal limits  GLUCOSE, CAPILLARY - Abnormal; Notable for the following:    Glucose-Capillary 576 (*)    All other components within normal limits  BASIC METABOLIC PANEL  CBC  URINALYSIS, COMPLETE (UACMP) WITH MICROSCOPIC  BLOOD GAS, VENOUS  BETA-HYDROXYBUTYRIC ACID  CBG MONITORING, ED    Elevated blood glucose with normal anion gap no ketones no protein in his urine all suggestive of uncomplicated hyperglycemia __________________________________________  EKG   ____________________________________________  RADIOLOGY   ____________________________________________   PROCEDURES  Procedure(s) performed: no  Procedures  Critical Care performed: no  Observation: no ____________________________________________   INITIAL IMPRESSION / ASSESSMENT AND PLAN / ED COURSE  Pertinent labs & imaging results that were available during my care of the patient were reviewed by me and considered in my medical decision making (see chart for details).  The patient arrives hemodynamically stable and very well-appearing. He has no ketones on his breath. He has no clue small breaths. He likely has new onset diabetes mellitus type 2. I will give him 2 L of fluid to begin as well as check broad labs including a VBG and a beta hydroxybutyrate.     The patient has no signs of diabetic ketoacidosis and no signs of hyper osmolar state. He is well-appearing and feels improved after 2 L of fluid. I  had a lengthy discussion with the patient and his wife at bedside that he likely has a diagnosis of diabetes mellitus type 2 and he said that he has follow-up with his primary care physician this coming Tuesday. I will add on a hemoglobin A1c today so his primary care physician has axis to that and begin him on low-dose metformin. Lexapro modifications discussed. He is discharged home in improved condition. ____________________________________________   FINAL CLINICAL IMPRESSION(S) / ED DIAGNOSES  Final diagnoses:  Dehydration  Type 2 diabetes mellitus without complication, without long-term current use of insulin (HCC)      NEW MEDICATIONS STARTED DURING THIS VISIT:  New Prescriptions   No medications on file     Note:  This document was prepared using Dragon voice recognition software and may include unintentional dictation errors.     Darel Hong, MD 08/19/16 1426

## 2016-08-18 NOTE — Discharge Instructions (Signed)
Please follow-up with your primary care physician on Tuesday as scheduled. Please take your metformin twice a day. It is normal free to feel nauseated or maybe even a little bit of stomach pain for the first few days while you are taking metformin. This will go away. Return to the emergency department for any concerns.  It was a pleasure to take care of you today, and thank you for coming to our emergency department.  If you have any questions or concerns before leaving please ask the nurse to grab me and I'm more than happy to go through your aftercare instructions again.  If you were prescribed any opioid pain medication today such as Norco, Vicodin, Percocet, morphine, hydrocodone, or oxycodone please make sure you do not drive when you are taking this medication as it can alter your ability to drive safely.  If you have any concerns once you are home that you are not improving or are in fact getting worse before you can make it to your follow-up appointment, please do not hesitate to call 911 and come back for further evaluation.  Darel Hong MD  Results for orders placed or performed during the hospital encounter of 24/58/09  Basic metabolic panel  Result Value Ref Range   Sodium 128 (L) 135 - 145 mmol/L   Potassium 3.8 3.5 - 5.1 mmol/L   Chloride 90 (L) 101 - 111 mmol/L   CO2 26 22 - 32 mmol/L   Glucose, Bld 563 (HH) 65 - 99 mg/dL   BUN 18 6 - 20 mg/dL   Creatinine, Ser 1.46 (H) 0.61 - 1.24 mg/dL   Calcium 10.0 8.9 - 10.3 mg/dL   GFR calc non Af Amer 51 (L) >60 mL/min   GFR calc Af Amer 59 (L) >60 mL/min   Anion gap 12 5 - 15  CBC  Result Value Ref Range   WBC 8.7 3.8 - 10.6 K/uL   RBC 6.87 (H) 4.40 - 5.90 MIL/uL   Hemoglobin 18.5 (H) 13.0 - 18.0 g/dL   HCT 55.4 (H) 40.0 - 52.0 %   MCV 80.7 80.0 - 100.0 fL   MCH 26.9 26.0 - 34.0 pg   MCHC 33.4 32.0 - 36.0 g/dL   RDW 14.5 11.5 - 14.5 %   Platelets 158 150 - 440 K/uL  Urinalysis, Complete w Microscopic  Result Value Ref  Range   Color, Urine STRAW (A) YELLOW   APPearance CLEAR (A) CLEAR   Specific Gravity, Urine 1.025 1.005 - 1.030   pH 6.0 5.0 - 8.0   Glucose, UA >=500 (A) NEGATIVE mg/dL   Hgb urine dipstick SMALL (A) NEGATIVE   Bilirubin Urine NEGATIVE NEGATIVE   Ketones, ur NEGATIVE NEGATIVE mg/dL   Protein, ur NEGATIVE NEGATIVE mg/dL   Nitrite NEGATIVE NEGATIVE   Leukocytes, UA NEGATIVE NEGATIVE   RBC / HPF 0-5 0 - 5 RBC/hpf   WBC, UA 0-5 0 - 5 WBC/hpf   Bacteria, UA NONE SEEN NONE SEEN   Squamous Epithelial / LPF NONE SEEN NONE SEEN  Glucose, capillary  Result Value Ref Range   Glucose-Capillary >600 (HH) 65 - 99 mg/dL  Glucose, capillary  Result Value Ref Range   Glucose-Capillary 576 (HH) 65 - 99 mg/dL   Comment 1 Repeat Test   Blood gas, venous  Result Value Ref Range   pH, Ven 7.42 7.250 - 7.430   pCO2, Ven 46 44.0 - 60.0 mmHg   pO2, Ven PENDING 32.0 - 45.0 mmHg   Bicarbonate 29.8 (  H) 20.0 - 28.0 mmol/L   Acid-Base Excess 4.5 (H) 0.0 - 2.0 mmol/L   O2 Saturation PENDING %   Patient temperature 37.0    Collection site LINE    Sample type VENOUS   Beta-hydroxybutyric acid  Result Value Ref Range   Beta-Hydroxybutyric Acid 0.32 (H) 0.05 - 0.27 mmol/L  Glucose, capillary  Result Value Ref Range   Glucose-Capillary 431 (H) 65 - 99 mg/dL

## 2016-08-20 LAB — BLOOD GAS, VENOUS
Acid-Base Excess: 4.5 mmol/L — ABNORMAL HIGH (ref 0.0–2.0)
Bicarbonate: 29.8 mmol/L — ABNORMAL HIGH (ref 20.0–28.0)
PCO2 VEN: 46 mmHg (ref 44.0–60.0)
PH VEN: 7.42 (ref 7.250–7.430)
Patient temperature: 37

## 2016-08-21 LAB — HEMOGLOBIN A1C
HEMOGLOBIN A1C: 13.2 % — AB (ref 4.8–5.6)
MEAN PLASMA GLUCOSE: 332 mg/dL

## 2016-08-21 LAB — GLUCOSE, CAPILLARY: Glucose-Capillary: >600 mg/dL (ref 65–99)

## 2016-08-22 ENCOUNTER — Telehealth: Payer: Self-pay | Admitting: Emergency Medicine

## 2016-08-22 NOTE — Telephone Encounter (Signed)
Called patient to ask about follow up plans and inform of a1c.  He says he did see dr Brynda Greathouse yesterday in follow up.

## 2017-03-23 IMAGING — CT CT HEAD W/O CM
1 series · 16 of 30 positions shown, 20 images · non-contrast
Comparison: None.

CLINICAL DATA: Syncope at work today.  Headache for days.

EXAM:
CT HEAD WITHOUT CONTRAST
TECHNIQUE: Contiguous axial images were obtained from the base of the skull
through the vertex without intravenous contrast.

[Series 2: head wo · axial · 0.42mm/px · z∈[-29,+102]mm · 16 of 30 slices shown, 20 images]
[im 2/30  brain]
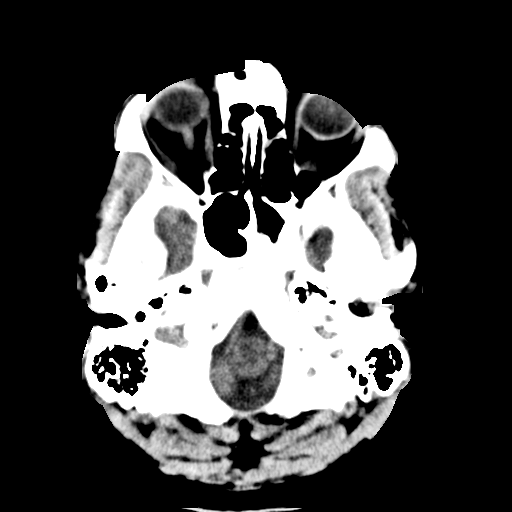
[im 2/30  bone]
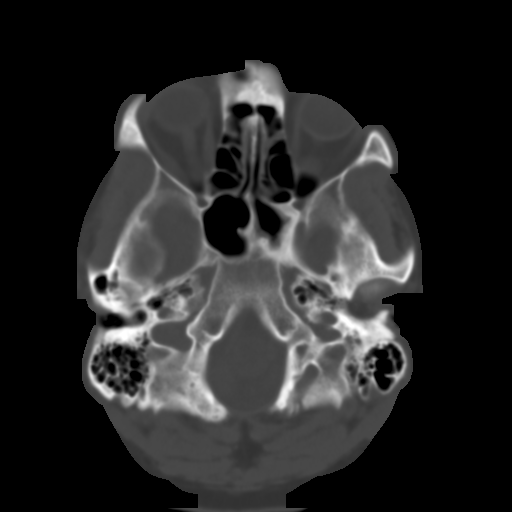
[im 4/30  brain]
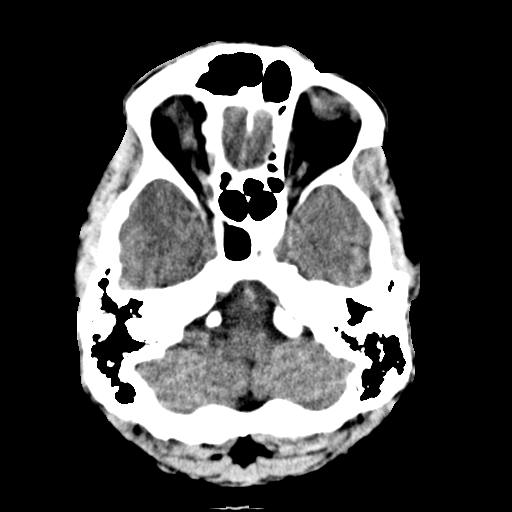
[im 6/30  brain]
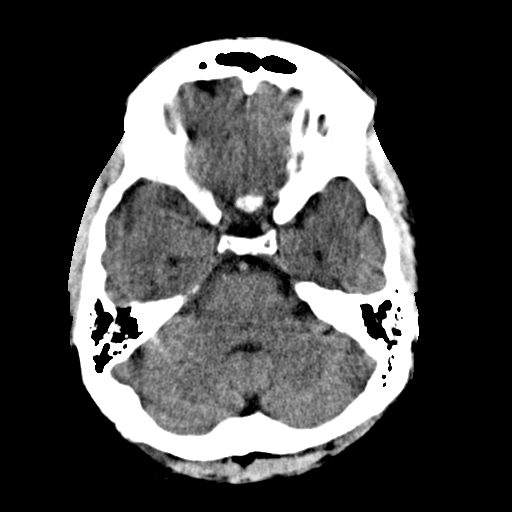
[im 8/30  brain]
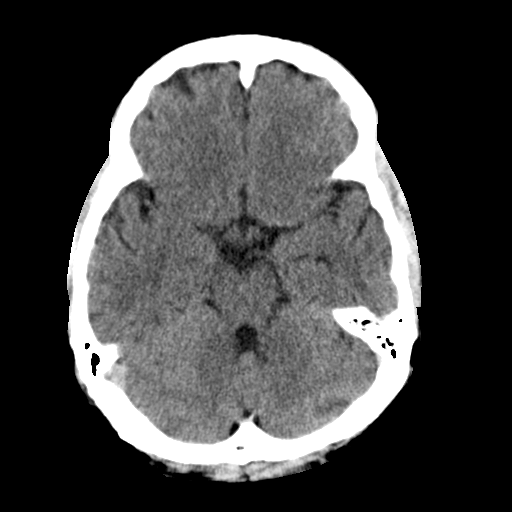
[im 9/30  brain]
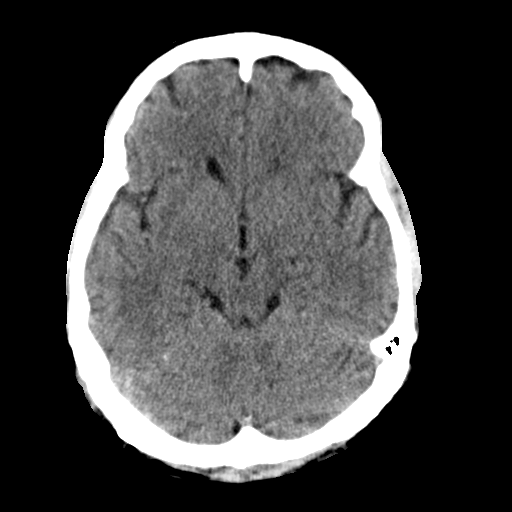
[im 9/30  bone]
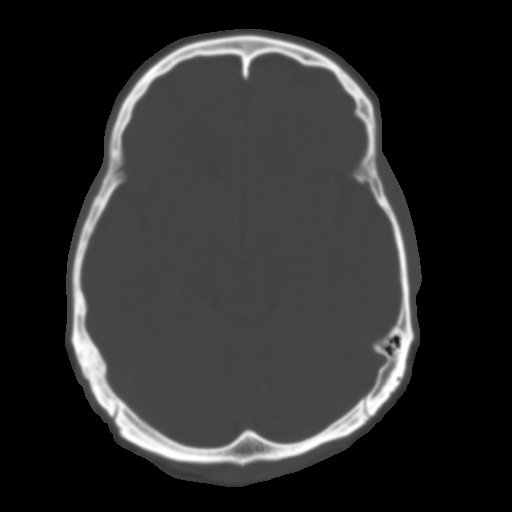
[im 11/30  brain]
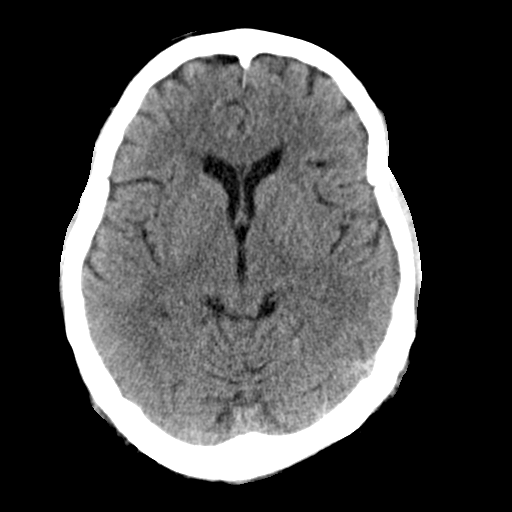
[im 13/30  brain]
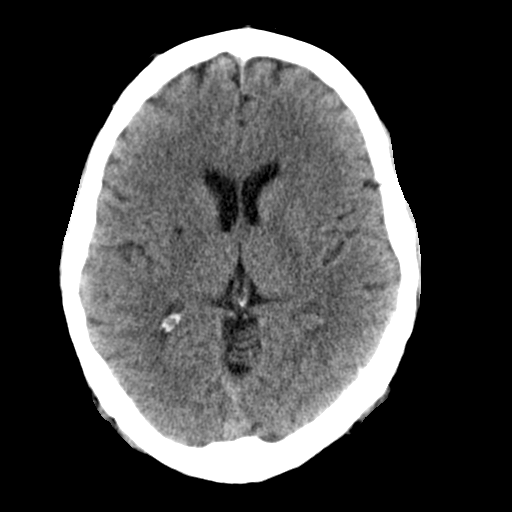
[im 15/30  brain]
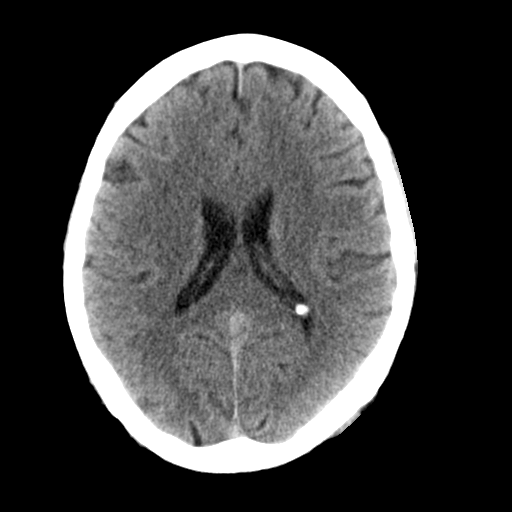
[im 16/30  brain]
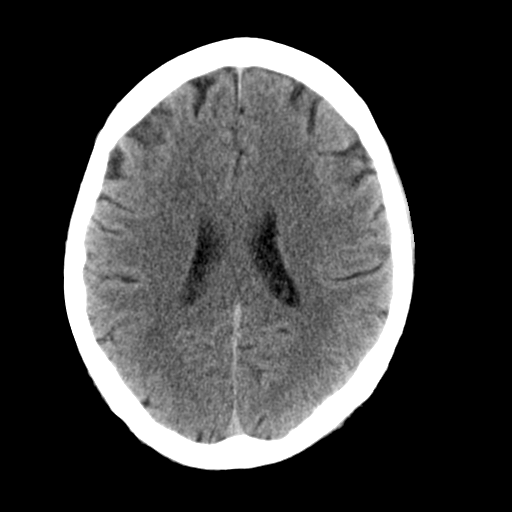
[im 16/30  bone]
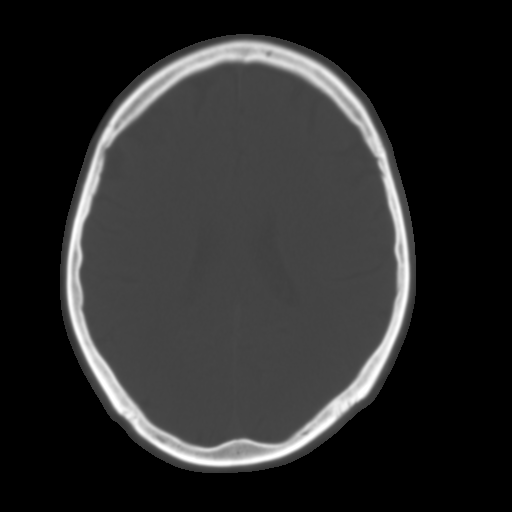
[im 18/30  brain]
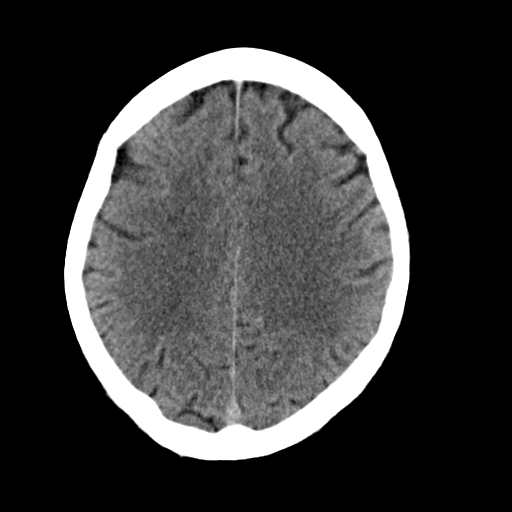
[im 20/30  brain]
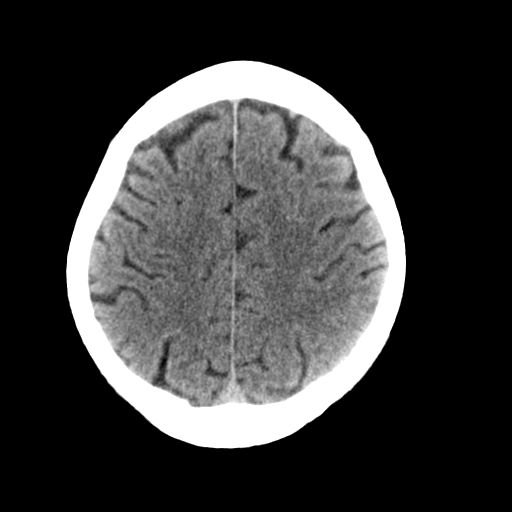
[im 22/30  brain]
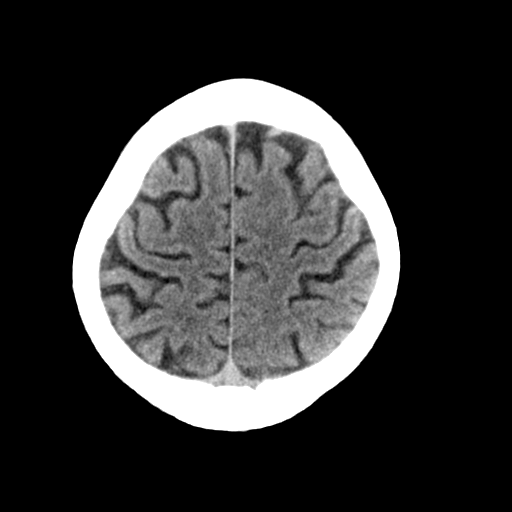
[im 23/30  brain]
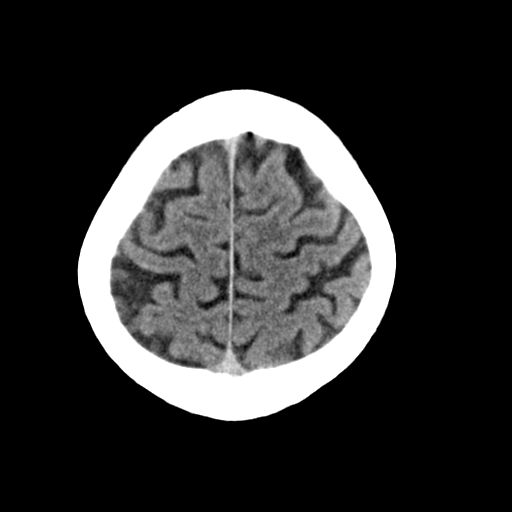
[im 23/30  bone]
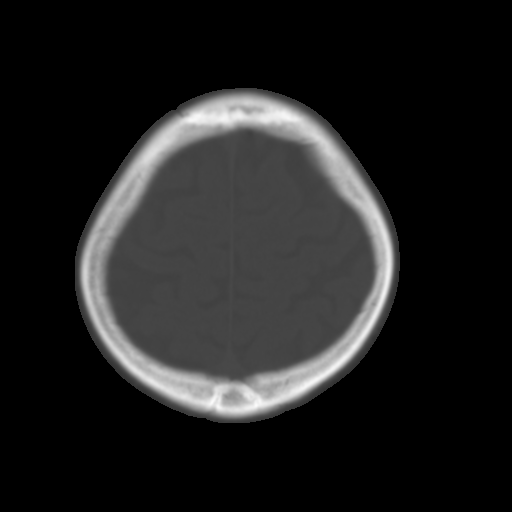
[im 25/30  brain]
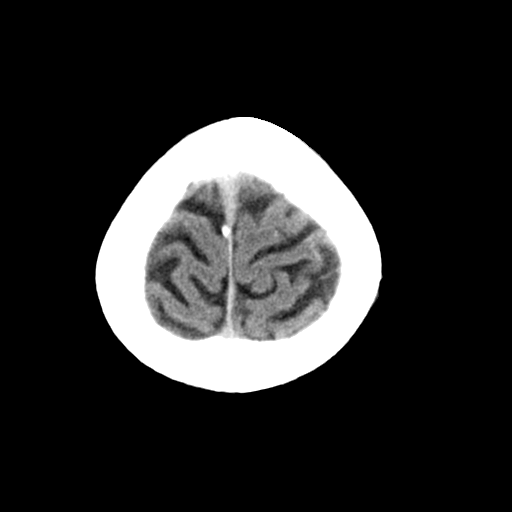
[im 27/30  brain]
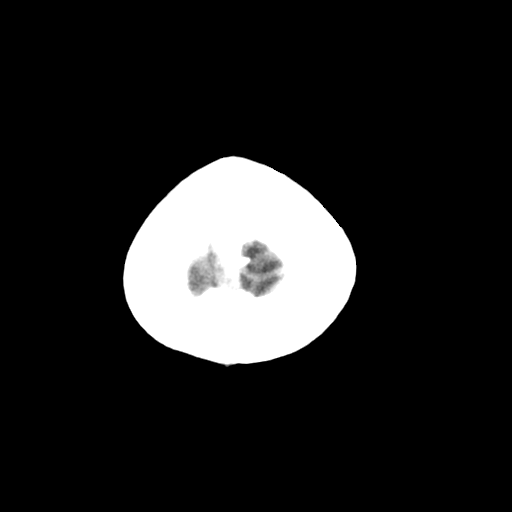
[im 29/30  brain]
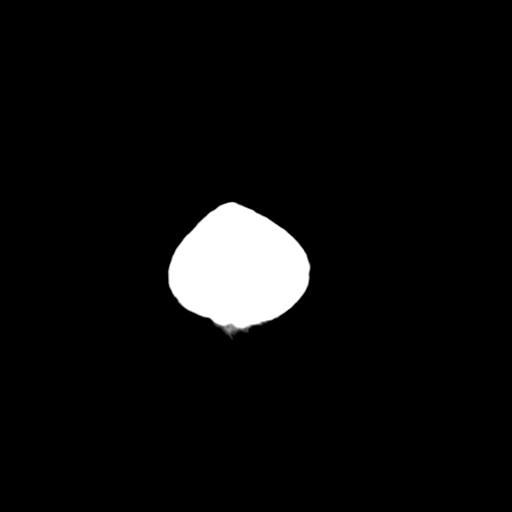

[16 of 30 positions shown; findings below may reference images not displayed]

FINDINGS: No acute intracranial abnormality. Specifically, no hemorrhage,
hydrocephalus, mass lesion, acute infarction, or significant
intracranial injury. No acute calvarial abnormality. Visualized
paranasal sinuses and mastoids clear. Orbital soft tissues
unremarkable.
IMPRESSION: No acute intracranial abnormality.

## 2017-03-23 IMAGING — DX DG CHEST 1V PORT
1 series · 1 of 1 positions shown · non-contrast
Comparison: 10/10/2011

CLINICAL DATA: Syncope

EXAM:
PORTABLE CHEST 1 VIEW

[chest ap]
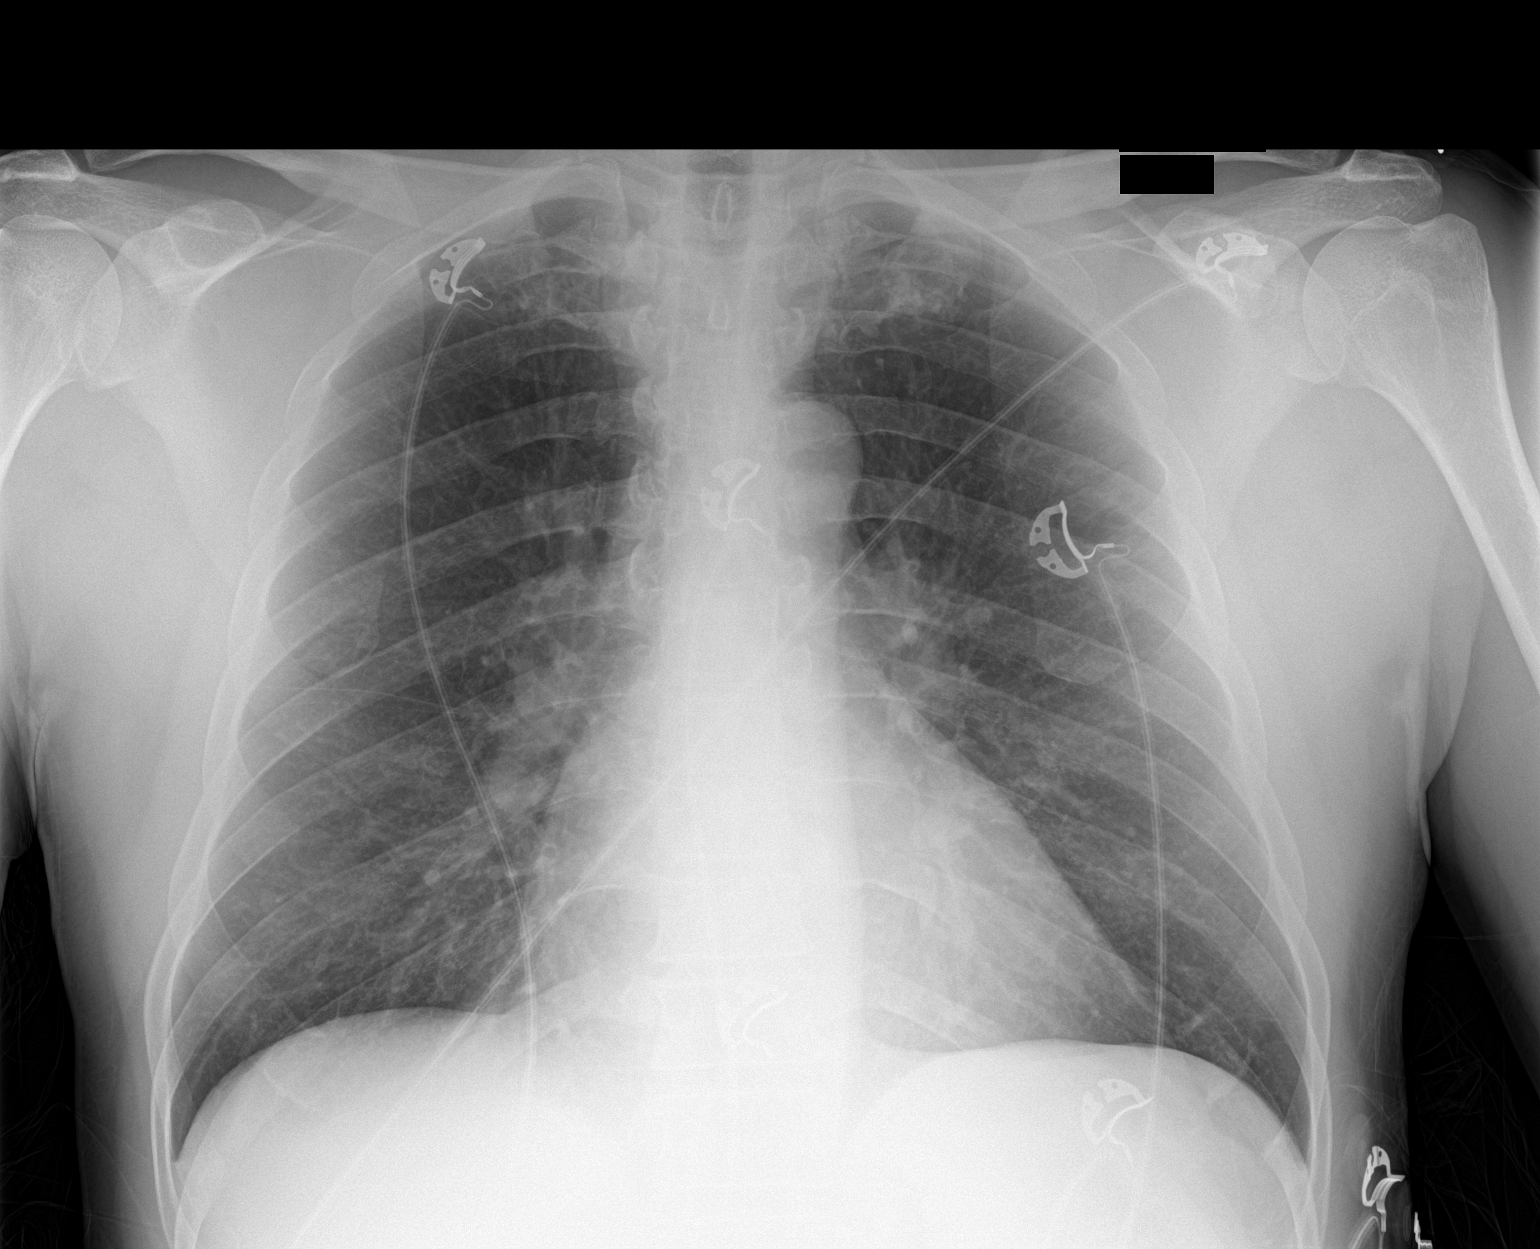

[1 of 1 positions shown; findings below may reference images not displayed]

FINDINGS: The heart size and mediastinal contours are within normal limits.
Both lungs are clear. The visualized skeletal structures are
unremarkable.
IMPRESSION: No active disease.

## 2017-04-23 ENCOUNTER — Ambulatory Visit (INDEPENDENT_AMBULATORY_CARE_PROVIDER_SITE_OTHER): Payer: No Typology Code available for payment source | Admitting: Nurse Practitioner

## 2017-04-23 ENCOUNTER — Other Ambulatory Visit: Payer: Self-pay

## 2017-04-23 ENCOUNTER — Encounter: Payer: Self-pay | Admitting: Nurse Practitioner

## 2017-04-23 VITALS — BP 150/67 | HR 69 | Temp 98.2°F | Ht 68.0 in | Wt 202.4 lb

## 2017-04-23 DIAGNOSIS — E119 Type 2 diabetes mellitus without complications: Secondary | ICD-10-CM | POA: Diagnosis not present

## 2017-04-23 DIAGNOSIS — I10 Essential (primary) hypertension: Secondary | ICD-10-CM

## 2017-04-23 LAB — POCT GLYCOSYLATED HEMOGLOBIN (HGB A1C): Hemoglobin A1C: 7.2

## 2017-04-23 MED ORDER — SITAGLIPTIN PHOSPHATE 100 MG PO TABS: 100.0000 mg | ORAL_TABLET | Freq: Every day | ORAL | 3 refills | Status: DC

## 2017-04-23 MED ORDER — HYDROCHLOROTHIAZIDE 25 MG PO TABS: 25.0000 mg | ORAL_TABLET | Freq: Every day | ORAL | 1 refills | Status: DC

## 2017-04-23 MED ORDER — METFORMIN HCL 500 MG PO TABS: 500.0000 mg | ORAL_TABLET | Freq: Two times a day (BID) | ORAL | 0 refills | Status: DC

## 2017-04-23 MED ORDER — LISINOPRIL 10 MG PO TABS: 10.0000 mg | ORAL_TABLET | Freq: Every day | ORAL | 1 refills | Status: DC

## 2017-04-23 NOTE — Progress Notes (Signed)
Subjective:    Patient ID: Anthony Bell, male    DOB: September 10, 1955, 62 y.o.   MRN: [id number]  Anthony Bell is a 62 y.o. male presenting on 04/23/2017 for Establish Care (diabetes, hypertension)   HPI Establish Care New Provider Pt last seen by PCP Dr Brynda Greathouse about 1 year ago.  Obtain records from Dr. Brynda Greathouse.   Diabetes Pt presents today for follow up of Type 2 diabetes mellitus. He is not checking CBG at home - Current diabetic medications include: metformin and glimepiride - He is not currently symptomatic.  - He denies polydipsia, polyphagia, polyuria, headaches, diaphoresis, shakiness, chills, pain, numbness or tingling in extremities and changes in vision.   - Clinical course has been unchanged. - He  reports no regular exercise routine. - His diet is low in salt, moderate in fat, and moderate in carbohydrates. - Weight trend: fluctuating a bit  PREVENTION: Eye exam current (within one year): no Foot exam current (within one year): no  Lipid/ASCVD risk reduction - on statin: no Kidney protection - on ace or arb: no Recent Labs    08/18/16 2035 04/23/17 1636  HGBA1C 13.2* 7.2%    Hypertension - He is not checking BP at home or outside of clinic.    - Current medications: hydrochlorothiazide 25 mg once daily, tolerating well without side effects - He is not currently symptomatic. - Pt denies headache, lightheadedness, dizziness, changes in vision, chest tightness/pressure, palpitations, leg swelling, sudden loss of speech or loss of consciousness.   Past Medical History:  Diagnosis Date  . Colon polyps   . GERD (gastroesophageal reflux disease)   . Hypertension   . Rectal cancer ([place])    [facility]   Past Surgical History:  Procedure Laterality Date  . COLONOSCOPY WITH PROPOFOL N/A 09/21/2014   Procedure: COLONOSCOPY WITH PROPOFOL;  Surgeon: Lollie Sails, MD;  Location: [facility] ENDOSCOPY;  Service: Endoscopy;  Laterality: N/A;  . ESOPHAGOGASTRODUODENOSCOPY N/A  09/21/2014   Procedure: ESOPHAGOGASTRODUODENOSCOPY (EGD);  Surgeon: Lollie Sails, MD;  Location: [facility] ENDOSCOPY;  Service: Endoscopy;  Laterality: N/A;  . lymphnode removed     Social History   Socioeconomic History  . Marital status: Married    Spouse name: Not on file  . Number of children: Not on file  . Years of education: Not on file  . Highest education level: Not on file  Social Needs  . Financial resource strain: Not on file  . Food insecurity - worry: Not on file  . Food insecurity - inability: Not on file  . Transportation needs - medical: Not on file  . Transportation needs - non-medical: Not on file  Occupational History  . Not on file  Tobacco Use  . Smoking status: Current Every Day Smoker    Packs/day: 1.00    Years: 40.00    Pack years: 40.00    Types: Cigarettes  . Smokeless tobacco: Never Used  Substance and Sexual Activity  . Alcohol use: No  . Drug use: No  . Sexual activity: Not on file  Other Topics Concern  . Not on file  Social History Narrative  . Not on file   Family History  Problem Relation Age of Onset  . Diabetes Mother   . Cancer Father   . Diabetes Father   . Diabetes Sister   . Heart attack Neg Hx   . Stroke Neg Hx    Current Outpatient Medications on File Prior to Visit  Medication Sig  . aspirin EC  81 MG tablet Take 81 mg by mouth.    No current facility-administered medications on file prior to visit.     Review of Systems  Constitutional: Negative.   HENT: Negative.   Eyes: Negative.   Respiratory: Negative.   Cardiovascular: Negative.   Gastrointestinal: Negative.   Endocrine: Negative.   Genitourinary: Negative.   Musculoskeletal: Positive for arthralgias.  Skin: Negative.   Allergic/Immunologic: Negative.   Neurological: Negative.   Hematological: Negative.   Psychiatric/Behavioral: Negative.    Per HPI unless specifically indicated above     Objective:    BP (!) 150/67 (BP Location: Right Arm,  Patient Position: Sitting, Cuff Size: Normal)   Pulse 69   Temp 98.2 F (36.8 C) (Oral)   Ht 5\' 8"  (1.727 m)   Wt 202 lb 6.4 oz (91.8 kg)   BMI 30.77 kg/m   Wt Readings from Last 3 Encounters:  04/23/17 202 lb 6.4 oz (91.8 kg)  08/18/16 195 lb (88.5 kg)  06/01/15 195 lb (88.5 kg)    Physical Exam  General - overweight, well-appearing, NAD HEENT - Normocephalic, atraumatic Neck - supple, non-tender, no LAD, no thyromegaly, no carotid bruit Heart - RRR, no murmurs heard Lungs - Clear throughout all lobes, no wheezing, crackles, or rhonchi. Normal work of breathing. Extremeties - non-tender, no edema, cap refill < 2 seconds, peripheral pulses intact +2 bilaterally Skin - warm, dry Neuro - awake, alert, oriented x3, normal gait Psych - Normal mood and affect, normal behavior   Results for orders placed or performed in visit on 04/23/17  POCT glycosylated hemoglobin (Hb A1C)  Result Value Ref Range   Hemoglobin A1C 7.2%       Assessment & Plan:   Problem List Items Addressed This Visit    None    Visit Diagnoses    Type 2 diabetes mellitus without complication, without long-term current use of insulin ([place])    -  Primary ControlledDM with A1c 7.2%.  Would like to be improved to goal, but is acceptable at < 8%. Goal A1c < 7.0%. - No known complications or hypoglycemia. - Complications - none  Plan:  1. Continue current therapy - metformin.  - STOP glimepiride - Start sitagliptin 100 mg once daily. 2. Encourage improved lifestyle: - low carb/low glycemic diet(handout provided) - Increase physical activity to 30 minutes most days of the week.  Explained that increased physical activity increases body's use of sugar for energy. 3. Check fasting am CBG.  Bring log to next visit for review 4. Continue ASA - START ACEi  Lisinopril 10 mg once daily ARB,  - Consider statin per guidelines.  Pt declines starting today.  Will assess lipid panel fasting. 5. DM Foot exam due, but  deferred today. Advised to schedule DM ophtho exam, send record 6. Follow-up 3 months.   Relevant Medications   aspirin EC 81 MG tablet   sitaGLIPtin (JANUVIA) 100 MG tablet   lisinopril (PRINIVIL,ZESTRIL) 10 MG tablet   metFORMIN (GLUCOPHAGE) 500 MG tablet   Other Relevant Orders   POCT glycosylated hemoglobin (Hb A1C) (Completed)   Essential hypertension     Uncontrolled hypertension.  BP above goal of < 130/80.  Pt is not currently working on lifestyle modifications.  Taking medications tolerating well without side effects. No currently identified complications.  Plan: 1. Continue taking HCTZ 25 mg once daily. - START lisinopril 10 mg once daily.   2. Obtain labs CMP, Lipid in 2 weeks.  BMP today.  3. Encouraged  heart healthy diet and increasing exercise to 30 minutes most days of the week. 4. Check BP 1-2 x per week at home, keep log, and bring to clinic at next appointment. 5. Follow up 3  months.     Relevant Medications   aspirin EC 81 MG tablet   lisinopril (PRINIVIL,ZESTRIL) 10 MG tablet   hydrochlorothiazide (HYDRODIURIL) 25 MG tablet      Meds ordered this encounter  Medications  . DISCONTD: metFORMIN (GLUCOPHAGE) 500 MG tablet    Sig: Take 1 tablet (500 mg total) by mouth 2 (two) times daily with a meal.    Dispense:  60 tablet    Refill:  0    Order Specific Question:   Supervising Provider    Answer:   [name] [[id number]]  . sitaGLIPtin (JANUVIA) 100 MG tablet    Sig: Take 1 tablet (100 mg total) by mouth daily.    Dispense:  30 tablet    Refill:  3    Order Specific Question:   Supervising Provider    Answer:   [name] [[id number]]  . lisinopril (PRINIVIL,ZESTRIL) 10 MG tablet    Sig: Take 1 tablet (10 mg total) by mouth daily.    Dispense:  90 tablet    Refill:  1    Order Specific Question:   Supervising Provider    Answer:   [name] [[id number]]  . hydrochlorothiazide (HYDRODIURIL) 25 MG tablet    Sig: Take 1 tablet  (25 mg total) by mouth daily.    Dispense:  90 tablet    Refill:  1    Order Specific Question:   Supervising Provider    Answer:   [name] [[id number]]  . metFORMIN (GLUCOPHAGE) 500 MG tablet    Sig: Take 1 tablet (500 mg total) by mouth 2 (two) times daily with a meal.    Dispense:  60 tablet    Refill:  0    Order Specific Question:   Supervising Provider    Answer:   [name] [[id number]]      Follow up plan: Return in about 3 months (around 07/22/2017) for Blood pressure, diabetes.  [name], DNP, AGPCNP-BC Adult Gerontology Primary Care Nurse Practitioner [facility] 05/19/2017, 3:00 PM

## 2017-04-23 NOTE — Patient Instructions (Addendum)
Anthony Bell, Thank you for coming in to clinic today.  1. You will be due for labs in 2 weeks after starting lisinopril. You will be due for Anthony Bell.  This means you should eat no food or drink after midnight.  Drink only water or coffee without cream/sugar on the morning of your lab visit. - Please go ahead and schedule a "Lab Only" visit in the morning at the clinic for lab draw in 2 weeks. - Your results will be available about 2-3 days after blood draw.  If you have set up a MyChart account, you can can log in to MyChart online to view your results and a brief explanation. Also, we can discuss your results together at your next office visit if you would like.   2. Your provider would like to you have your annual eye exam. Please contact your current eye doctor or here are some good options for you to contact.   [facility]   Address: [address] [place], [place] [address] Phone: [phone]  Website: [email or web address] [address], [place], [place] [address] Phone: [phone] [email or web address]  [facility]  Address: [place], [place], [place] [address] Phone: [phone]   [facility] [address] [place], [place] [address] Phone: [phone]  [facility] Address: [place], [place], [place] [address]  Phone: [phone]   Please schedule a follow-up appointment with [name], AGNP. Return in about 3 months (around 07/22/2017) for Blood pressure, diabetes.  If you have any other questions or concerns, please feel free to call the clinic or send a message through [place]. You may also schedule an earlier appointment if necessary.  You will receive a survey after today's visit either digitally by e-mail or paper by [employer]. Your experiences and feedback matter to [place].  Please respond so we know how we are doing as we provide care for you.   [name], DNP, AGNP-BC Adult  Gerontology Nurse Practitioner [place]

## 2017-05-19 ENCOUNTER — Encounter: Payer: Self-pay | Admitting: Nurse Practitioner

## 2017-05-21 ENCOUNTER — Other Ambulatory Visit: Payer: Self-pay | Admitting: Nurse Practitioner

## 2017-05-21 DIAGNOSIS — E119 Type 2 diabetes mellitus without complications: Secondary | ICD-10-CM

## 2017-07-17 ENCOUNTER — Other Ambulatory Visit: Payer: Self-pay | Admitting: Nurse Practitioner

## 2017-07-17 DIAGNOSIS — E119 Type 2 diabetes mellitus without complications: Secondary | ICD-10-CM

## 2017-07-25 ENCOUNTER — Ambulatory Visit: Payer: No Typology Code available for payment source | Admitting: Nurse Practitioner

## 2017-08-02 ENCOUNTER — Other Ambulatory Visit: Payer: Self-pay

## 2017-08-02 ENCOUNTER — Encounter: Payer: Self-pay | Admitting: Nurse Practitioner

## 2017-08-02 ENCOUNTER — Ambulatory Visit (INDEPENDENT_AMBULATORY_CARE_PROVIDER_SITE_OTHER): Payer: No Typology Code available for payment source | Admitting: Nurse Practitioner

## 2017-08-02 VITALS — BP 155/61 | HR 71 | Temp 97.7°F | Ht 68.0 in | Wt 205.2 lb

## 2017-08-02 DIAGNOSIS — E119 Type 2 diabetes mellitus without complications: Secondary | ICD-10-CM

## 2017-08-02 DIAGNOSIS — Z23 Encounter for immunization: Secondary | ICD-10-CM

## 2017-08-02 DIAGNOSIS — Z8701 Personal history of pneumonia (recurrent): Secondary | ICD-10-CM | POA: Diagnosis not present

## 2017-08-02 DIAGNOSIS — Z9189 Other specified personal risk factors, not elsewhere classified: Secondary | ICD-10-CM

## 2017-08-02 DIAGNOSIS — I10 Essential (primary) hypertension: Secondary | ICD-10-CM | POA: Diagnosis not present

## 2017-08-02 LAB — POCT GLYCOSYLATED HEMOGLOBIN (HGB A1C): Hemoglobin A1C: 7.4

## 2017-08-02 MED ORDER — METFORMIN HCL 500 MG PO TABS: 500.0000 mg | ORAL_TABLET | Freq: Two times a day (BID) | ORAL | 1 refills | Status: DC

## 2017-08-02 MED ORDER — HYDROCHLOROTHIAZIDE 25 MG PO TABS: 25.0000 mg | ORAL_TABLET | Freq: Every day | ORAL | 1 refills | Status: DC

## 2017-08-02 MED ORDER — SITAGLIPTIN PHOSPHATE 100 MG PO TABS: 100.0000 mg | ORAL_TABLET | Freq: Every day | ORAL | 1 refills | Status: DC

## 2017-08-02 MED ORDER — LISINOPRIL 10 MG PO TABS: 10.0000 mg | ORAL_TABLET | Freq: Every day | ORAL | 1 refills | Status: DC

## 2017-08-02 NOTE — Progress Notes (Signed)
Anthony Bell  

## 2017-08-02 NOTE — Progress Notes (Signed)
Subjective:    Patient ID: Anthony Bell, male    DOB: Mar 06, 1956, 62 y.o.   MRN: [id number]  Anthony Bell is a 62 y.o. male presenting on 08/02/2017 for Hypertension   HPI Hypertension - He is checking BP at home or outside of clinic.   Readings occasionally 130, but usually 150-160 with work checks - Current medications: lisinopril and hydrochlorothiazide, tolerating well without side effects except fatigue with dose increase from last visit.  Pt has returned to prior BP med doses. - He is not currently symptomatic. - Pt denies headache, lightheadedness, dizziness, changes in vision, chest tightness/pressure, palpitations, leg swelling, sudden loss of speech or loss of consciousness. - He  reports no regular exercise routine outside of work. - His diet is moderate in salt, moderate in fat, and moderate in carbohydrates.    Diabetes Pt presents today for follow up of Type 2 diabetes mellitus. He is not checking CBG at home - Current diabetic medications include: metformin and Januvia - He is not currently symptomatic.  - He denies polydipsia, polyphagia, polyuria, headaches, diaphoresis, shakiness, chills, pain, numbness or tingling in extremities and changes in vision.   - Clinical course has been stable. - Weight trend: increasing steadily  PREVENTION: Eye exam current (within one year): no Foot exam current (within one year): no  Lipid/ASCVD risk reduction - on statin: no - pt declines Kidney protection - on ace or arb: yes  Recent Labs    04/23/17 1636 08/02/17 1630  HGBA1C 7.2% 7.4    Social History   Tobacco Use  . Smoking status: Current Every Day Smoker    Packs/day: 1.00    Years: 40.00    Pack years: 40.00    Types: Cigarettes  . Smokeless tobacco: Never Used  Substance Use Topics  . Alcohol use: No  . Drug use: No    Review of Systems Per HPI unless specifically indicated above     Objective:    BP (!) 155/61 (BP Location: Right Arm, Patient  Position: Sitting, Cuff Size: Normal)   Pulse 71   Temp 97.7 F (36.5 C) (Oral)   Ht 5\' 8"  (1.727 m)   Wt 205 lb 3.2 oz (93.1 kg)   BMI 31.20 kg/m   Wt Readings from Last 3 Encounters:  08/02/17 205 lb 3.2 oz (93.1 kg)  04/23/17 202 lb 6.4 oz (91.8 kg)  08/18/16 195 lb (88.5 kg)    Physical Exam  Constitutional: He is oriented to person, place, and time. He appears well-developed and well-nourished. No distress.  HENT:  Head: Normocephalic and atraumatic.  Neck: Normal range of motion. Neck supple. Carotid bruit is not present.  Cardiovascular: Normal rate, regular rhythm, S1 normal, S2 normal, normal heart sounds and intact distal pulses.  Pulmonary/Chest: Effort normal and breath sounds normal. No respiratory distress.  Musculoskeletal: He exhibits no edema (pedal).  Neurological: He is alert and oriented to person, place, and time.  Skin: Skin is warm and dry.  Psychiatric: He has a normal mood and affect. His behavior is normal.  Vitals reviewed.    Diabetic Foot Exam - Simple   Simple Foot Form Diabetic Foot exam was performed with the following findings:  Yes 08/02/2017  9:40 AM  Visual Inspection No deformities, no ulcerations, no other skin breakdown bilaterally:  Yes Sensation Testing Intact to touch and monofilament testing bilaterally:  Yes Pulse Check Posterior Tibialis and Dorsalis pulse intact bilaterally:  Yes Comments      Results for orders  placed or performed in visit on 08/02/17  POCT glycosylated hemoglobin (Hb A1C)  Result Value Ref Range   Hemoglobin A1C 7.4       Assessment & Plan:   Problem List Items Addressed This Visit      Cardiovascular and Mediastinum   Essential hypertension    Uncontrolled hypertension.  BP goal < 130/80.  Pt is continuing to work on lifestyle modifications.  Taking medications tolerating well without side effects. No currently identified complications.  Last kidney function WNL.  Due again for recheck.  Plan: 1.  Continue taking lisinopril and HCTZ.  Advised pt to continue with dose increase as recommended.  Is likely to feel fatigued with a normal BP for several weeks.  Encouraged that this will improve slowly 2. Obtain labs  3. Encouraged heart healthy diet and increasing exercise to 30 minutes most days of the week. 4. Check BP 1-2 x per week at home, keep log, and bring to clinic at next appointment. 5. Follow up 3 months.        Relevant Medications   hydrochlorothiazide (HYDRODIURIL) 25 MG tablet   lisinopril (PRINIVIL,ZESTRIL) 10 MG tablet     Endocrine   Type 2 diabetes mellitus without complication, without long-term current use of insulin (H[facility]) - Primary    Improving control and is near goal for T2DM with A1c 7.4% stable from 7.2% 3 months ago.  Goal A1c < 7.0%. - No known complications or hypoglycemia.  Plan:  1. Continue current therapy: metformin and Januvia. 2. Encourage improved lifestyle: - low carb/low glycemic diet handout provided - Increase physical activity to 30 minutes most days of the week.  Explained that increased physical activity increases body's use of sugar for energy. 3. Check fasting am CBG.  Bring log to next visit for review 4. Continue ACEi 5. DM Foot exam done today with normal findings.   and Advised to schedule DM ophtho exam, send record. 6. Follow-up 3 months.       Relevant Medications   sitaGLIPtin (JANUVIA) 100 MG tablet   metFORMIN (GLUCOPHAGE) 500 MG tablet   lisinopril (PRINIVIL,ZESTRIL) 10 MG tablet   Other Relevant Orders   POCT glycosylated hemoglobin (Hb A1C) (Completed)   COMPLETE METABOLIC PANEL WITH GFR   Lipid panel   Pneumococcal polysaccharide vaccine 23-valent greater than or equal to 2yo subcutaneous/IM (Completed)    Other Visit Diagnoses    History of pneumonia as indication for 23-polyvalent pneumococcal polysaccharide vaccine       Relevant Orders   Pneumococcal polysaccharide vaccine 23-valent greater than or equal to 2yo  subcutaneous/IM (Completed)      Vaccine indicated for adult with T2DM.  Pt agrees to receive today.  Meds ordered this encounter  Medications  . sitaGLIPtin (JANUVIA) 100 MG tablet    Sig: Take 1 tablet (100 mg total) by mouth daily.    Dispense:  90 tablet    Refill:  1    Order Specific Question:   Supervising Provider    Answer:   [name] [[id number]]  . metFORMIN (GLUCOPHAGE) 500 MG tablet    Sig: Take 1 tablet (500 mg total) by mouth 2 (two) times daily with a meal.    Dispense:  180 tablet    Refill:  1    Order Specific Question:   Supervising Provider    Answer:   [name] [[id number]]  . hydrochlorothiazide (HYDRODIURIL) 25 MG tablet    Sig: Take 1 tablet (25 mg total) by  mouth daily.    Dispense:  90 tablet    Refill:  1    Order Specific Question:   Supervising Provider    Answer:   [name] [[id number]]  . lisinopril (PRINIVIL,ZESTRIL) 10 MG tablet    Sig: Take 1 tablet (10 mg total) by mouth daily.    Dispense:  90 tablet    Refill:  1    Order Specific Question:   Supervising Provider    Answer:   [name] [[id number]]    Follow up plan: Return in about 3 months (around 11/02/2017) for diabetes, hypertension.  [name], DNP, AGPCNP-BC Adult Gerontology Primary Care Nurse Practitioner [facility] 08/23/2017, 1:50 PM

## 2017-08-02 NOTE — Patient Instructions (Addendum)
Anthony Bell,   Thank you for coming in to clinic today.  1. Advice on Protecting Your Feet  1. Daily foot inspections - Look for any breaks in the skin or areas of irritation such as blisters or red areas. Report to primary doctor or foot nurse immediately if any problems occur.  - If you have vision problems and cannot see your feet well or it is hard for you to reach your feet, ask a family member to inspect your feet daily.   2. Daily foot hygiene  - Wash feet daily, but do not soak your feet in hot water. If you do have callus, you may use warm water epsom salt foot soak and use moisturizer after. - Dry well especially between toes; Pat dry do not rub.  - If your skin is dry use a lotion to moisturize but never between the toes.  - If your skin is wet from perspiration, use an antifungal foot powder daily.   3. Shoes and Socks  - Wear a clean pair of socks daily.  - Make sure your shoes fit well and that you are measured and properly fit each time you purchase shoes. Your shoe size may change.  - Powder your shoes with a small amount of an antifungal foot powder daily, as the shoe is the only article of clothing that is not laundered.  - Wear appropriate shoes and socks for the weather. It is especially important to protect your feet from the cold; however, don't forget about sunscreen to the tops of your feet in the hot sun.  - Wear well fitting shoes rather than slippers or flip-flops when walking or standing for long period of time.  - When at home make sure you always have protective foot wear on your feet.    Take both blood pressure medications with full tablets.  You will feel tired for the first 2-4 weeks on these.  Continue working to improve diet.  Eat at least 2 meals or 1 meal and 2 snacks daily.  2. You will be due for FASTING BLOOD WORK.  This means you should eat no food or drink after midnight.  Drink only water or coffee without cream/sugar on the morning of your  lab visit.  - Have your labs drawn at [facility] in the next 7 days.  - Your results will be available about 2-3 days after blood draw.  If you have set up a MyChart account, you can can log in to MyChart online to view your results and a brief explanation. Also, we can discuss your results together at your next office visit if you would like.     Your provider would like to you have your annual eye exam. Please contact your current eye doctor or here are some good options for you to contact.   Please choose an ophthalmologist for your diabetic eye exam.  [facility]   Address: [address] [place], [place] [address] Phone: [phone]  Website: [email or web address] [address], [place], [place] [address] Phone: [phone] [email or web address]  [facility]  Address: [place], [place], [place] [address] Phone: [phone]   [facility] [address] [place], [place] [address] Phone: [phone]  [facility] Address: [place], [place], [place] [address]  Phone: [phone]   Please schedule a follow-up appointment with [name], AGNP. Return in about 3 months (around 11/02/2017) for diabetes,  hypertension.  If you have any other questions or concerns, please feel free to call the clinic or send a message through [place]. You may also schedule an earlier appointment if necessary.  You will receive a survey after today's visit either digitally by e-mail or paper by [employer]. Your experiences and feedback matter to [place].  Please respond so we know how we are doing as we provide care for you.   [name], DNP, AGNP-BC Adult Gerontology Nurse Practitioner [place]

## 2017-08-23 ENCOUNTER — Encounter: Payer: Self-pay | Admitting: Nurse Practitioner

## 2017-08-23 DIAGNOSIS — E119 Type 2 diabetes mellitus without complications: Secondary | ICD-10-CM | POA: Insufficient documentation

## 2017-08-23 NOTE — Assessment & Plan Note (Signed)
Uncontrolled hypertension.  BP goal < 130/80.  Pt is continuing to work on lifestyle modifications.  Taking medications tolerating well without side effects. No currently identified complications.  Last kidney function WNL.  Due again for recheck.  Plan: 1. Continue taking lisinopril and HCTZ.  Advised pt to continue with dose increase as recommended.  Is likely to feel fatigued with a normal BP for several weeks.  Encouraged that this will improve slowly 2. Obtain labs  3. Encouraged heart healthy diet and increasing exercise to 30 minutes most days of the week. 4. Check BP 1-2 x per week at home, keep log, and bring to clinic at next appointment. 5. Follow up 3 months.

## 2017-08-23 NOTE — Assessment & Plan Note (Signed)
Improving control and is near goal for T2DM with A1c 7.4% stable from 7.2% 3 months ago.  Goal A1c < 7.0%. - No known complications or hypoglycemia.  Plan:  1. Continue current therapy: metformin and Januvia. 2. Encourage improved lifestyle: - low carb/low glycemic diet handout provided - Increase physical activity to 30 minutes most days of the week.  Explained that increased physical activity increases body's use of sugar for energy. 3. Check fasting am CBG.  Bring log to next visit for review 4. Continue ACEi 5. DM Foot exam done today with normal findings.   and Advised to schedule DM ophtho exam, send record. 6. Follow-up 3 months.

## 2017-11-04 ENCOUNTER — Ambulatory Visit: Payer: No Typology Code available for payment source

## 2018-02-02 ENCOUNTER — Other Ambulatory Visit: Payer: Self-pay

## 2018-02-02 ENCOUNTER — Emergency Department
Admission: EM | Admit: 2018-02-02 | Discharge: 2018-02-02 | Disposition: A | Discharge status: Home / Self Care | Payer: No Typology Code available for payment source | Attending: Emergency Medicine | Admitting: Emergency Medicine

## 2018-02-02 DIAGNOSIS — Z85048 Personal history of other malignant neoplasm of rectum, rectosigmoid junction, and anus: Secondary | ICD-10-CM | POA: Diagnosis not present

## 2018-02-02 DIAGNOSIS — R739 Hyperglycemia, unspecified: Secondary | ICD-10-CM

## 2018-02-02 DIAGNOSIS — F17210 Nicotine dependence, cigarettes, uncomplicated: Secondary | ICD-10-CM | POA: Diagnosis not present

## 2018-02-02 DIAGNOSIS — Z79899 Other long term (current) drug therapy: Secondary | ICD-10-CM | POA: Diagnosis not present

## 2018-02-02 DIAGNOSIS — E1165 Type 2 diabetes mellitus with hyperglycemia: Secondary | ICD-10-CM | POA: Insufficient documentation

## 2018-02-02 DIAGNOSIS — I10 Essential (primary) hypertension: Secondary | ICD-10-CM | POA: Insufficient documentation

## 2018-02-02 DIAGNOSIS — Z7982 Long term (current) use of aspirin: Secondary | ICD-10-CM | POA: Diagnosis not present

## 2018-02-02 DIAGNOSIS — E119 Type 2 diabetes mellitus without complications: Secondary | ICD-10-CM

## 2018-02-02 HISTORY — DX: Type 2 diabetes mellitus without complications: E11.9

## 2018-02-02 LAB — URINALYSIS, COMPLETE (UACMP) WITH MICROSCOPIC
BILIRUBIN URINE: NEGATIVE
Bacteria, UA: NONE SEEN
KETONES UR: NEGATIVE mg/dL
Leukocytes, UA: NEGATIVE
Nitrite: NEGATIVE
PROTEIN: NEGATIVE mg/dL
SPECIFIC GRAVITY, URINE: 1.029 (ref 1.005–1.030)
Squamous Epithelial / LPF: NONE SEEN (ref 0–5)
pH: 5 (ref 5.0–8.0)

## 2018-02-02 LAB — GLUCOSE, CAPILLARY
Glucose-Capillary: 433 mg/dL — ABNORMAL HIGH (ref 70–99)
Glucose-Capillary: 532 mg/dL (ref 70–99)
Glucose-Capillary: 382 mg/dL — ABNORMAL HIGH (ref 70–99)

## 2018-02-02 LAB — CBC
HCT: 52.3 % — ABNORMAL HIGH (ref 39.0–52.0)
Hemoglobin: 17.5 g/dL — ABNORMAL HIGH (ref 13.0–17.0)
MCH: 27.6 pg (ref 26.0–34.0)
MCHC: 33.5 g/dL (ref 30.0–36.0)
MCV: 82.6 fL (ref 80.0–100.0)
Platelets: 196 10*3/uL (ref 150–400)
RBC: 6.33 MIL/uL — ABNORMAL HIGH (ref 4.22–5.81)
RDW: 13.8 % (ref 11.5–15.5)
WBC: 8 10*3/uL (ref 4.0–10.5)
nRBC: 0 % (ref 0.0–0.2)

## 2018-02-02 LAB — BASIC METABOLIC PANEL
ANION GAP: 13 (ref 5–15)
BUN: 26 mg/dL — ABNORMAL HIGH (ref 6–20)
CALCIUM: 9.5 mg/dL (ref 8.9–10.3)
CO2: 28 mmol/L (ref 22–32)
CREATININE: 1.55 mg/dL — AB (ref 0.61–1.24)
Chloride: 89 mmol/L — ABNORMAL LOW (ref 98–111)
GFR calc Af Amer: 54 mL/min — ABNORMAL LOW (ref >60)
GFR, EST NON AFRICAN AMERICAN: 47 mL/min — AB (ref >60)
GLUCOSE: 767 mg/dL — AB (ref 70–99)
Potassium: 4.5 mmol/L (ref 3.5–5.1)
Sodium: 130 mmol/L — ABNORMAL LOW (ref 135–145)

## 2018-02-02 MED ORDER — METFORMIN HCL 500 MG PO TABS: 1000.0000 mg | ORAL_TABLET | Freq: Two times a day (BID) | ORAL | 1 refills | Status: DC

## 2018-02-02 MED ORDER — INSULIN ASPART 100 UNIT/ML ~~LOC~~ SOLN
10.0000 [IU] | Freq: Once | SUBCUTANEOUS | Status: AC
  Administered 2018-02-02: 10 [IU] via SUBCUTANEOUS
  Filled 2018-02-02: qty 1

## 2018-02-02 MED ORDER — SODIUM CHLORIDE 0.9 % IV SOLN
Freq: Once | INTRAVENOUS | Status: AC
  Administered 2018-02-02: 20:00:00 via INTRAVENOUS

## 2018-02-02 MED ORDER — SODIUM CHLORIDE 0.9 % IV BOLUS
500.0000 mL | Freq: Once | INTRAVENOUS | Status: AC
  Administered 2018-02-02: 500 mL via INTRAVENOUS

## 2018-02-02 MED ORDER — SODIUM CHLORIDE 0.9 % IV BOLUS
1000.0000 mL | Freq: Once | INTRAVENOUS | Status: AC
  Administered 2018-02-02: 1000 mL via INTRAVENOUS

## 2018-02-02 NOTE — ED Provider Notes (Signed)
[facility] Emergency Department Provider Note   ____________________________________________    I have reviewed the triage vital signs and the nursing notes.   HISTORY  Chief Complaint Hyperglycemia   HPI Anthony Bell is a 62 y.o. male who presents with polyuria, polydipsia over the last several days.  He also reports blurry vision.  Patient has a history of diabetes and is on metformin, he reports he has borderline diabetes ".  Does not check his sugar at home.  Denies cough fever chills nausea vomiting.  No chest pain.  Has not taken anything for this.  Reports compliance with his medications  Past Medical History:  Diagnosis Date  . Colon polyps   . Diabetes mellitus without complication ([place])   . GERD (gastroesophageal reflux disease)   . Hypertension   . Rectal cancer [facility])    [facility]    Patient Active Problem List   Diagnosis Date Noted  . Type 2 diabetes mellitus without complication, without long-term current use of insulin ([place]) 08/23/2017  . Gastroesophageal reflux disease 12/07/2014  . Essential hypertension 12/07/2014  . Rectal cancer ([place]) 12/07/2014  . Tobacco abuse 12/07/2014  . Tubular adenoma polyp of rectum 11/08/2014    Past Surgical History:  Procedure Laterality Date  . COLONOSCOPY WITH PROPOFOL N/A 09/21/2014   Procedure: COLONOSCOPY WITH PROPOFOL;  Surgeon: Lollie Sails, MD;  Location: [facility] ENDOSCOPY;  Service: Endoscopy;  Laterality: N/A;  . ESOPHAGOGASTRODUODENOSCOPY N/A 09/21/2014   Procedure: ESOPHAGOGASTRODUODENOSCOPY (EGD);  Surgeon: Lollie Sails, MD;  Location: [facility] ENDOSCOPY;  Service: Endoscopy;  Laterality: N/A;  . lymphnode removed      Prior to Admission medications   Medication Sig Start Date End Date Taking? Authorizing Provider  aspirin EC 81 MG tablet Take 81 mg by mouth.    Yes [provider]  hydrochlorothiazide (HYDRODIURIL) 25 MG tablet Take 1 tablet (25 mg total) by mouth  daily. 08/02/17  Yes Mikey College, NP  lisinopril (PRINIVIL,ZESTRIL) 10 MG tablet Take 1 tablet (10 mg total) by mouth daily. 08/02/17  Yes Mikey College, NP  metFORMIN (GLUCOPHAGE) 500 MG tablet Take 2 tablets (1,000 mg total) by mouth 2 (two) times daily with a meal. 02/02/18 02/02/19  Lavonia Drafts, MD  sitaGLIPtin (JANUVIA) 100 MG tablet Take 1 tablet (100 mg total) by mouth daily. Patient not taking: Reported on 02/02/2018 08/02/17   Mikey College, NP     Allergies Patient has no known allergies.  Family History  Problem Relation Age of Onset  . Diabetes Mother   . Cancer Father   . Diabetes Father   . Diabetes Sister   . Heart attack Neg Hx   . Stroke Neg Hx     Social History Social History   Tobacco Use  . Smoking status: Current Every Day Smoker    Packs/day: 1.00    Years: 40.00    Pack years: 40.00    Types: Cigarettes  . Smokeless tobacco: Never Used  Substance Use Topics  . Alcohol use: No  . Drug use: No    Review of Systems  Constitutional: No fever/chills Eyes: Blurry vision ENT: Polydipsia Cardiovascular: Denies chest pain. Respiratory: Denies shortness of breath. Gastrointestinal: No abdominal pain.  No nausea, no vomiting.   Genitourinary: Polyuria Musculoskeletal: Negative for back pain. Skin: Negative for rash. Neurological: Negative for headaches or weakness   ____________________________________________   PHYSICAL EXAM:  VITAL SIGNS: ED Triage Vitals  Enc Vitals Group  BP 02/02/18 1835 (!) 175/76     Pulse Rate 02/02/18 1835 86     Resp 02/02/18 1835 16     Temp 02/02/18 1835 97.9 F (36.6 C)     Temp Source 02/02/18 1835 Oral     SpO2 02/02/18 1835 94 %     Weight 02/02/18 1836 91.6 kg (202 lb)     Height 02/02/18 1836 1.702 m (5\' 7" )     Head Circumference --      Peak Flow --      Pain Score 02/02/18 1836 0     Pain Loc --      Pain Edu? --      Excl. in [place]? --     Constitutional: Alert and  oriented. No acute distress, sitting in chair on the side of the room Eyes: Conjunctivae are normal.   Nose: No congestion/rhinnorhea. Mouth/Throat: Mucous membranes are moist.    Cardiovascular: Normal rate, regular rhythm. Grossly normal heart sounds.  Good peripheral circulation. Respiratory: Normal respiratory effort.  No retractions. Lungs CTAB. Gastrointestinal: Soft and nontender. No distention.  No CVA tenderness. Genitourinary: deferred Musculoskeletal: No lower extremity tenderness nor edema.  Warm and well perfused Neurologic:  Normal speech and language. No gross focal neurologic deficits are appreciated.  Skin:  Skin is warm, dry and intact. No rash noted. Psychiatric: Mood and affect are normal. Speech and behavior are normal.  ____________________________________________   LABS (all labs ordered are listed, but only abnormal results are displayed)  Labs Reviewed  GLUCOSE, CAPILLARY - Abnormal; Notable for the following components:      Result Value   Glucose-Capillary >600 (*)    All other components within normal limits  BASIC METABOLIC PANEL - Abnormal; Notable for the following components:   Sodium 130 (*)    Chloride 89 (*)    Glucose, Bld 767 (*)    BUN 26 (*)    Creatinine, Ser 1.55 (*)    GFR calc non Af Amer 47 (*)    GFR calc Af Amer 54 (*)    All other components within normal limits  CBC - Abnormal; Notable for the following components:   RBC 6.33 (*)    Hemoglobin 17.5 (*)    HCT 52.3 (*)    All other components within normal limits  URINALYSIS, COMPLETE (UACMP) WITH MICROSCOPIC - Abnormal; Notable for the following components:   Color, Urine COLORLESS (*)    APPearance CLEAR (*)    Glucose, UA >=500 (*)    Hgb urine dipstick SMALL (*)    All other components within normal limits  GLUCOSE, CAPILLARY - Abnormal; Notable for the following components:   Glucose-Capillary 433 (*)    All other components within normal limits  GLUCOSE, CAPILLARY -  Abnormal; Notable for the following components:   Glucose-Capillary 532 (*)    All other components within normal limits  GLUCOSE, CAPILLARY - Abnormal; Notable for the following components:   Glucose-Capillary 382 (*)    All other components within normal limits  CBG MONITORING, ED  CBG MONITORING, ED   ____________________________________________  EKG  None ____________________________________________  RADIOLOGY   ____________________________________________   PROCEDURES  Procedure(s) performed: No  Procedures   Critical Care performed: No ____________________________________________   INITIAL IMPRESSION / ASSESSMENT AND PLAN / ED COURSE  Pertinent labs & imaging results that were available during my care of the patient were reviewed by me and considered in my medical decision making (see chart for details).  Patient overall  well-appearing in no acute distress, vital signs are unremarkable.  Glucometer reads high.  We will give IV fluids, insulin 10 units subcu while we await labs.  Lab work chemistries glucose of 767.  Anion gap is normal.  Patient's glucose improved to 433 however then increased to 532, he denies eating anything.  Additional insulin given.  Additional fluids given.  ----------------------------------------- 11:09 PM on 02/02/2018 -----------------------------------------  Glucose is improved to 300, patient feels well and is anxious to go home.  Will increase his metformin dose, close follow-up with PCP.    ____________________________________________   FINAL CLINICAL IMPRESSION(S) / ED DIAGNOSES  Final diagnoses:  Hyperglycemia        Note:  This document was prepared using Dragon voice recognition software and may include unintentional dictation errors.    Lavonia Drafts, MD 02/02/18 [phone]

## 2018-02-02 NOTE — ED Notes (Signed)
Patient resting quietly at this time.

## 2018-02-02 NOTE — ED Triage Notes (Addendum)
States "I can't hold my water (states "peeing constantly") and my throat feels dry." states flu shot last week and symptoms began after. States blurred vision. Pt states "borderline DM". Wife states pt takes metformin, is not on insulin. Doesn't check CBG at home.   A&O, ambulatory. Speech clear. Moving all extremities, no weakness noted. CBG "HI"

## 2018-02-02 NOTE — ED Triage Notes (Signed)
First Nurse Note:  C/O 5 day history of dry mouth and increased urination.  Patient is a diabetic and has been taking glucophage as directed.  Patient is AAOx3.  Skin warm and dry.  MAE equally and strong.  Gait steady.  Posture upright and relaxed.

## 2018-02-02 NOTE — ED Notes (Signed)
Patient reports symptoms for the past 5 days with increased urination and thirst.  Patient states that he has been taking his Metformin as prescribed.

## 2018-02-24 ENCOUNTER — Other Ambulatory Visit: Payer: Self-pay | Admitting: Nurse Practitioner

## 2018-02-24 DIAGNOSIS — I10 Essential (primary) hypertension: Secondary | ICD-10-CM

## 2018-08-03 ENCOUNTER — Other Ambulatory Visit: Payer: Self-pay | Admitting: Nurse Practitioner

## 2018-08-03 DIAGNOSIS — I10 Essential (primary) hypertension: Secondary | ICD-10-CM

## 2018-08-04 NOTE — Telephone Encounter (Signed)
Agree. Please notify to schedule apt. For refill HTN HCTZ  Nobie Putnam, DO [facility] 08/04/2018, 11:34 PM

## 2018-08-07 ENCOUNTER — Other Ambulatory Visit: Payer: Self-pay | Admitting: Nurse Practitioner

## 2018-08-07 DIAGNOSIS — I10 Essential (primary) hypertension: Secondary | ICD-10-CM

## 2019-01-10 ENCOUNTER — Other Ambulatory Visit: Payer: Self-pay | Admitting: Family Medicine

## 2019-01-10 DIAGNOSIS — I10 Essential (primary) hypertension: Secondary | ICD-10-CM

## 2019-01-15 ENCOUNTER — Encounter: Payer: Self-pay | Admitting: Nurse Practitioner

## 2019-01-15 ENCOUNTER — Ambulatory Visit (INDEPENDENT_AMBULATORY_CARE_PROVIDER_SITE_OTHER): Payer: No Typology Code available for payment source | Admitting: Nurse Practitioner

## 2019-01-15 ENCOUNTER — Other Ambulatory Visit: Payer: Self-pay

## 2019-01-15 VITALS — BP 178/76 | HR 69 | Temp 98.5°F | Resp 18 | Ht 67.0 in | Wt 204.2 lb

## 2019-01-15 DIAGNOSIS — C20 Malignant neoplasm of rectum: Secondary | ICD-10-CM

## 2019-01-15 DIAGNOSIS — I10 Essential (primary) hypertension: Secondary | ICD-10-CM | POA: Diagnosis not present

## 2019-01-15 DIAGNOSIS — E1165 Type 2 diabetes mellitus with hyperglycemia: Secondary | ICD-10-CM

## 2019-01-15 MED ORDER — JARDIANCE 10 MG PO TABS: 10.0000 mg | ORAL_TABLET | Freq: Every day | ORAL | 1 refills | Status: DC

## 2019-01-15 MED ORDER — LISINOPRIL 20 MG PO TABS: 20.0000 mg | ORAL_TABLET | Freq: Every day | ORAL | 1 refills | Status: DC

## 2019-01-15 MED ORDER — METFORMIN HCL 500 MG PO TABS: 1000.0000 mg | ORAL_TABLET | Freq: Two times a day (BID) | ORAL | 1 refills | Status: DC

## 2019-01-15 MED ORDER — HYDROCHLOROTHIAZIDE 25 MG PO TABS: 25.0000 mg | ORAL_TABLET | Freq: Every day | ORAL | 1 refills | Status: DC

## 2019-01-15 NOTE — Patient Instructions (Addendum)
Anthony Bell,   Thank you for coming in to clinic today.  1. INCREASE lisinopril to 20 mg once daily  2. START Jardiance 10 mg once daily (for blood sugar, but is also a fluid pill and will help blood pressure some).  3. Continue metformin and hydrochlorothiazide without changes.  Please schedule a follow-up appointment. Return in about 4 months (around 05/18/2019) for diabetes, hypertension.  If you have any other questions or concerns, please feel free to call the clinic or send a message through [place]. You may also schedule an earlier appointment if necessary.  You will receive a survey after today's visit either digitally by e-mail or paper by [employer]. Your experiences and feedback matter to [place].  Please respond so we know how we are doing as we provide care for you.  [name], DNP, AGNP-BC Adult Gerontology Nurse Practitioner [facility], [facility]    Managing Your Hypertension Hypertension is commonly called high blood pressure. This is when the force of your blood pressing against the walls of your arteries is too strong. Arteries are blood vessels that carry blood from your heart throughout your body. Hypertension forces the heart to work harder to pump blood, and may cause the arteries to become narrow or stiff. Having untreated or uncontrolled hypertension can cause heart attack, stroke, kidney disease, and other problems. What are blood pressure readings? A blood pressure reading consists of a higher number over a lower number. Ideally, your blood pressure should be below 120/80. The first ("top") number is called the systolic pressure. It is a measure of the pressure in your arteries as your heart beats. The second ("bottom") number is called the diastolic pressure. It is a measure of the pressure in your arteries as the heart relaxes. What does my blood pressure reading mean? Blood pressure is classified into four stages. Based on your blood pressure  reading, your health care provider may use the following stages to determine what type of treatment you need, if any. Systolic pressure and diastolic pressure are measured in a unit called mm Hg. Normal  Systolic pressure: below 123456.  Diastolic pressure: below 80. Elevated  Systolic pressure: [id number].  Diastolic pressure: below 80. Hypertension stage 1  Systolic pressure: 0000000.  Diastolic pressure: [id number]. Hypertension stage 2  Systolic pressure: [id number] or above.  Diastolic pressure: 90 or above. What health risks are associated with hypertension? Managing your hypertension is an important responsibility. Uncontrolled hypertension can lead to:  A heart attack.  A stroke.  A weakened blood vessel (aneurysm).  Heart failure.  Kidney damage.  Eye damage.  Metabolic syndrome.  Memory and concentration problems. What changes can I make to manage my hypertension? Hypertension can be managed by making lifestyle changes and possibly by taking medicines. Your health care provider will help you make a plan to bring your blood pressure within a normal range. Eating and drinking   Eat a diet that is high in fiber and potassium, and low in salt (sodium), added sugar, and fat. An example eating plan is called the DASH (Dietary Approaches to Stop Hypertension) diet. To eat this way: ? Eat plenty of fresh fruits and vegetables. Try to fill half of your plate at each meal with fruits and vegetables. ? Eat whole grains, such as whole wheat pasta, brown rice, or whole grain bread. Fill about one quarter of your plate with whole grains. ? Eat low-fat diary products. ? Avoid fatty cuts of meat, processed or cured meats, and  poultry with skin. Fill about one quarter of your plate with lean proteins such as fish, chicken without skin, beans, eggs, and tofu. ? Avoid premade and processed foods. These tend to be higher in sodium, added sugar, and fat.  Reduce your daily sodium intake. Most  people with hypertension should eat less than 1,500 mg of sodium a day.  Limit alcohol intake to no more than 1 drink a day for nonpregnant women and 2 drinks a day for men. One drink equals 12 oz of beer, 5 oz of wine, or 1 oz of hard liquor. Lifestyle  Work with your health care provider to maintain a healthy body weight, or to lose weight. Ask what an ideal weight is for you.  Get at least 30 minutes of exercise that causes your heart to beat faster (aerobic exercise) most days of the week. Activities may include walking, swimming, or biking.  Include exercise to strengthen your muscles (resistance exercise), such as weight lifting, as part of your weekly exercise routine. Try to do these types of exercises for 30 minutes at least 3 days a week.  Do not use any products that contain nicotine or tobacco, such as cigarettes and e-cigarettes. If you need help quitting, ask your health care provider.  Control any long-term (chronic) conditions you have, such as high cholesterol or diabetes. Monitoring  Monitor your blood pressure at home as told by your health care provider. Your personal target blood pressure may vary depending on your medical conditions, your age, and other factors.  Have your blood pressure checked regularly, as often as told by your health care provider. Working with your health care provider  Review all the medicines you take with your health care provider because there may be side effects or interactions.  Talk with your health care provider about your diet, exercise habits, and other lifestyle factors that may be contributing to hypertension.  Visit your health care provider regularly. Your health care provider can help you create and adjust your plan for managing hypertension. Will I need medicine to control my blood pressure? Your health care provider may prescribe medicine if lifestyle changes are not enough to get your blood pressure under control, and  if:  Your systolic blood pressure is 130 or higher.  Your diastolic blood pressure is 80 or higher. Take medicines only as told by your health care provider. Follow the directions carefully. Blood pressure medicines must be taken as prescribed. The medicine does not work as well when you skip doses. Skipping doses also puts you at risk for problems. Contact a health care provider if:  You think you are having a reaction to medicines you have taken.  You have repeated (recurrent) headaches.  You feel dizzy.  You have swelling in your ankles.  You have trouble with your vision. Get help right away if:  You develop a severe headache or confusion.  You have unusual weakness or numbness, or you feel faint.  You have severe pain in your chest or abdomen.  You vomit repeatedly.  You have trouble breathing. Summary  Hypertension is when the force of blood pumping through your arteries is too strong. If this condition is not controlled, it may put you at risk for serious complications.  Your personal target blood pressure may vary depending on your medical conditions, your age, and other factors. For most people, a normal blood pressure is less than 120/80.  Hypertension is managed by lifestyle changes, medicines, or both. Lifestyle changes include  weight loss, eating a healthy, low-sodium diet, exercising more, and limiting alcohol. This information is not intended to replace advice given to you by your health care provider. Make sure you discuss any questions you have with your health care provider. Document Released: 12/05/2011 Document Revised: 07/04/2018 Document Reviewed: 02/08/2016 Elsevier Patient Education  2020 [employer].

## 2019-01-15 NOTE — Progress Notes (Signed)
Subjective:    Patient ID: Anthony Bell, male    DOB: [birth year], 63 y.o.   MRN: [id number]  Anthony Bell is a 63 y.o. male presenting on 01/15/2019 for Hypertension and Diabetes   HPI Diabetes Pt presents today for follow up of Type 2 diabetes mellitus. He is not checking CBG at home - Current diabetic medications include: metformin - He is not currently symptomatic.  - He denies polydipsia, polyphagia, polyuria, headaches, diaphoresis, shakiness, chills, pain, numbness or tingling in extremities and changes in vision.   - Clinical course has been unchanged. - He  reports no regular exercise routine. - His diet is moderate in salt, moderate in fat, and moderate in carbohydrates. - Weight trend: stable  PREVENTION: Eye exam current (within one year): no Foot exam current (within one year): no Lipid/ASCVD risk reduction - on statin: no - patient refuses Kidney protection - on ace or arb: lisinopril Recent Labs    01/16/19 0000  HGBA1C 7.4*   Rectal Cancer: patient reports good scan at last follow-up visit at [facility].  Repeat scan planned for 1 year.  Social History   Tobacco Use  . Smoking status: Current Every Day Smoker    Packs/day: 1.00    Years: 40.00    Pack years: 40.00    Types: Cigarettes  . Smokeless tobacco: Never Used  Substance Use Topics  . Alcohol use: No  . Drug use: No    Review of Systems Per HPI unless specifically indicated above     Objective:    BP (!) 185/74 (BP Location: Right Arm, Patient Position: Sitting, Cuff Size: Normal)   Pulse 69   Temp 98.5 F (36.9 C) (Oral)   Resp 18   Ht 5\' 7"  (1.702 m)   Wt 204 lb 3.2 oz (92.6 kg)   SpO2 99%   BMI 31.98 kg/m   Wt Readings from Last 3 Encounters:  01/15/19 204 lb 3.2 oz (92.6 kg)  02/02/18 202 lb (91.6 kg)  08/02/17 205 lb 3.2 oz (93.1 kg)    Physical Exam Vitals signs reviewed.  Constitutional:      General: He is not in acute distress.    Appearance: Normal  appearance. He is well-developed. He is obese.  HENT:     Head: Normocephalic and atraumatic.  Neck:     Musculoskeletal: Normal range of motion and neck supple.     Vascular: No carotid bruit.  Cardiovascular:     Rate and Rhythm: Normal rate and regular rhythm.     Pulses: Normal pulses.     Heart sounds: Murmur (2/6 holosystolic) present. No friction rub. No gallop.   Pulmonary:     Effort: Pulmonary effort is normal.     Breath sounds: Normal breath sounds.  Skin:    General: Skin is warm and dry.     Capillary Refill: Capillary refill takes less than 2 seconds.  Neurological:     General: No focal deficit present.     Mental Status: He is alert and oriented to person, place, and time. Mental status is at baseline.  Psychiatric:        Mood and Affect: Mood normal.        Behavior: Behavior normal.        Thought Content: Thought content normal.        Judgment: Judgment normal.    Results for orders placed or performed during the hospital encounter of 02/02/18  Glucose, capillary  Result Value Ref  Range   Glucose-Capillary >600 (HH) 70 - 99 mg/dL  Basic metabolic panel  Result Value Ref Range   Sodium 130 (L) 135 - 145 mmol/L   Potassium 4.5 3.5 - 5.1 mmol/L   Chloride 89 (L) 98 - 111 mmol/L   CO2 28 22 - 32 mmol/L   Glucose, Bld 767 (HH) 70 - 99 mg/dL   BUN 26 (H) 6 - 20 mg/dL   Creatinine, Ser 1.55 (H) 0.61 - 1.24 mg/dL   Calcium 9.5 8.9 - 10.3 mg/dL   GFR calc non Af Amer 47 (L) >60 mL/min   GFR calc Af Amer 54 (L) >60 mL/min   Anion gap 13 5 - 15  CBC  Result Value Ref Range   WBC 8.0 4.0 - 10.5 K/uL   RBC 6.33 (H) 4.22 - 5.81 MIL/uL   Hemoglobin 17.5 (H) 13.0 - 17.0 g/dL   HCT 52.3 (H) 39.0 - 52.0 %   MCV 82.6 80.0 - 100.0 fL   MCH 27.6 26.0 - 34.0 pg   MCHC 33.5 30.0 - 36.0 g/dL   RDW 13.8 11.5 - 15.5 %   Platelets 196 150 - 400 K/uL   nRBC 0.0 0.0 - 0.2 %  Urinalysis, Complete w Microscopic  Result Value Ref Range   Color, Urine COLORLESS (A)  YELLOW   APPearance CLEAR (A) CLEAR   Specific Gravity, Urine 1.029 1.005 - 1.030   pH 5.0 5.0 - 8.0   Glucose, UA >=500 (A) NEGATIVE mg/dL   Hgb urine dipstick SMALL (A) NEGATIVE   Bilirubin Urine NEGATIVE NEGATIVE   Ketones, ur NEGATIVE NEGATIVE mg/dL   Protein, ur NEGATIVE NEGATIVE mg/dL   Nitrite NEGATIVE NEGATIVE   Leukocytes, UA NEGATIVE NEGATIVE   RBC / HPF 0-5 0 - 5 RBC/hpf   WBC, UA 0-5 0 - 5 WBC/hpf   Bacteria, UA NONE SEEN NONE SEEN   Squamous Epithelial / LPF NONE SEEN 0 - 5  Glucose, capillary  Result Value Ref Range   Glucose-Capillary 433 (H) 70 - 99 mg/dL  Glucose, capillary  Result Value Ref Range   Glucose-Capillary 532 (HH) 70 - 99 mg/dL   Comment 1 Document in Chart    Comment 2 Call MD NNP PA CNM   Glucose, capillary  Result Value Ref Range   Glucose-Capillary 382 (H) 70 - 99 mg/dL      Assessment & Plan:   Problem List Items Addressed This Visit      Cardiovascular and Mediastinum   Essential hypertension Uncontrolled hypertension, improved some on recheck today.  BP goal < 130/80.  Pt is not currently working on lifestyle modifications.  Taking medications tolerating well without side effects.   Plan: 1. Continue taking hydrochlorothiazide 25 mg once daily - INCREASE lisinopril to 20 mg once daily - Likely will need 3rd agent, but patient did not want to start today 2. Obtain labs  3. Encouraged heart healthy diet and increasing exercise to 30 minutes most days of the week. 4. Check BP 1-2 x per week at home, keep log, and bring to clinic at next appointment. 5. Follow up 3 months.     Relevant Medications   lisinopril (ZESTRIL) 20 MG tablet   hydrochlorothiazide (HYDRODIURIL) 25 MG tablet     Digestive   Rectal cancer ([place]) Continues following with [facility]. Repeat surveillance scan planned in 1 year.     Endocrine   Type 2 diabetes mellitus with hyperglycemia, without long-term current use of insulin ([place]) -  Primary  Uncontrolled, but near goal DM with A1c > 7% stable from last check and goal A1c < 7.0%. - Complications - hyperglycemia.  Plan:  1. Continue current therapy: metformin 1000 mg bid  - START Jardiance 10 mg daily.  Copay card provided 2. Encourage improved lifestyle: - low carb/low glycemic diet reinforced prior education - Increase physical activity to 30 minutes most days of the week.  Explained that increased physical activity increases body's use of sugar for energy. 3. Check fasting am CBG and bring log to next visit for review 4. Continue ASA and ACEi.  Discussed adding statin - patient declines today 5. Advised to schedule DM ophtho exam, send record. 6. Follow-up 3 months     Relevant Medications   empagliflozin (JARDIANCE) 10 MG TABS tablet   lisinopril (ZESTRIL) 20 MG tablet   metFORMIN (GLUCOPHAGE) 500 MG tablet   Other Relevant Orders   POCT HgB A1C (Completed)      Meds ordered this encounter  Medications  . empagliflozin (JARDIANCE) 10 MG TABS tablet    Sig: Take 10 mg by mouth daily before breakfast.    Dispense:  90 tablet    Refill:  1    Order Specific Question:   Supervising Provider    Answer:   [name] [[id number]]  . lisinopril (ZESTRIL) 20 MG tablet    Sig: Take 1 tablet (20 mg total) by mouth daily.    Dispense:  90 tablet    Refill:  1    Order Specific Question:   Supervising Provider    Answer:   [name] [[id number]]  . hydrochlorothiazide (HYDRODIURIL) 25 MG tablet    Sig: Take 1 tablet (25 mg total) by mouth daily.    Dispense:  90 tablet    Refill:  1    Order Specific Question:   Supervising Provider    Answer:   [name] [[id number]]  . metFORMIN (GLUCOPHAGE) 500 MG tablet    Sig: Take 2 tablets (1,000 mg total) by mouth 2 (two) times daily with a meal.    Dispense:  180 tablet    Refill:  1    Order Specific Question:   Supervising Provider    Answer:   [name] [[id number]]    Follow up plan:  Return in about 4 months (around 05/18/2019) for diabetes, hypertension.  [name], DNP, AGPCNP-BC Adult Gerontology Primary Care Nurse Practitioner [facility] 01/15/2019, 4:10 PM

## 2019-01-16 ENCOUNTER — Encounter: Payer: Self-pay | Admitting: Nurse Practitioner

## 2019-01-16 LAB — POCT GLYCOSYLATED HEMOGLOBIN (HGB A1C): Hemoglobin A1C: 7.4 % — AB (ref 4.0–5.6)

## 2019-04-09 ENCOUNTER — Other Ambulatory Visit: Payer: Self-pay | Admitting: Nurse Practitioner

## 2019-04-09 DIAGNOSIS — E1165 Type 2 diabetes mellitus with hyperglycemia: Secondary | ICD-10-CM

## 2019-06-18 ENCOUNTER — Ambulatory Visit: Payer: No Typology Code available for payment source | Attending: Internal Medicine

## 2019-06-18 DIAGNOSIS — Z23 Encounter for immunization: Secondary | ICD-10-CM

## 2019-06-18 NOTE — Progress Notes (Signed)
   Covid-19 Vaccination Clinic  Name:  Anthony Bell    MRN: [id number] DOB: [birth year]  06/18/2019  [name] was observed post Covid-19 immunization for 15 minutes without incident. He was provided with Vaccine Information Sheet and instruction to access the V-Safe system.   [name] was instructed to call 911 with any severe reactions post vaccine: Marland Kitchen Difficulty breathing  . Swelling of face and throat  . A fast heartbeat  . A bad rash all over body  . Dizziness and weakness   Immunizations Administered    Name Date Dose VIS Date Route   Pfizer COVID-19 Vaccine 06/18/2019  8:41 AM 0.3 mL 03/06/2019 Intramuscular   Manufacturer: [employer]   Lot: [id number]   [place]: [id number]

## 2019-07-05 ENCOUNTER — Other Ambulatory Visit: Payer: Self-pay | Admitting: Family Medicine

## 2019-07-05 DIAGNOSIS — E1165 Type 2 diabetes mellitus with hyperglycemia: Secondary | ICD-10-CM

## 2019-07-07 ENCOUNTER — Other Ambulatory Visit: Payer: Self-pay | Admitting: Family Medicine

## 2019-07-07 ENCOUNTER — Other Ambulatory Visit: Payer: Self-pay | Admitting: Nurse Practitioner

## 2019-07-07 DIAGNOSIS — E1165 Type 2 diabetes mellitus with hyperglycemia: Secondary | ICD-10-CM

## 2019-07-07 DIAGNOSIS — I10 Essential (primary) hypertension: Secondary | ICD-10-CM

## 2019-07-08 NOTE — Telephone Encounter (Signed)
Requested medication (s) are due for refill today -yes  Requested medication (s) are on the active medication list -yes  Future visit scheduled -no  Last refill: 04/23/19  Notes to clinic: Attempted to call patient on both phone lines: temp unavailable and mailbox full- unable to leave message. Patient needs appointment- request forwarded for PCP review  Requested Prescriptions  Pending Prescriptions Disp Refills   hydrochlorothiazide (HYDRODIURIL) 25 MG tablet [Pharmacy Med Name: HYDROCHLOROTHIAZIDE 25 MG TAB] 90 tablet 1    Sig: TAKE 1 TABLET BY MOUTH EVERY DAY      Cardiovascular: Diuretics - Thiazide Failed - 07/07/2019  4:25 PM      Failed - Ca in normal range and within 360 days    Calcium  Date Value Ref Range Status  02/02/2018 9.5 8.9 - 10.3 mg/dL Final   Calcium, Total  Date Value Ref Range Status  10/10/2011 9.2 8.5 - 10.1 mg/dL Final          Failed - Cr in normal range and within 360 days    Creatinine  Date Value Ref Range Status  10/10/2011 1.43 (H) 0.60 - 1.30 mg/dL Final   Creatinine, Ser  Date Value Ref Range Status  02/02/2018 1.55 (H) 0.61 - 1.24 mg/dL Final          Failed - K in normal range and within 360 days    Potassium  Date Value Ref Range Status  02/02/2018 4.5 3.5 - 5.1 mmol/L Final    Comment:    HEMOLYSIS AT THIS LEVEL MAY AFFECT RESULT  10/10/2011 4.3 3.5 - 5.1 mmol/L Final          Failed - Na in normal range and within 360 days    Sodium  Date Value Ref Range Status  02/02/2018 130 (L) 135 - 145 mmol/L Final  10/10/2011 144 136 - 145 mmol/L Final          Failed - Last BP in normal range    BP Readings from Last 1 Encounters:  01/15/19 (!) 178/76          Passed - Valid encounter within last 6 months    Recent Outpatient Visits           5 months ago Type 2 diabetes mellitus with hyperglycemia, without long-term current use of insulin ([place])   [facility] Mikey [employer], NP   1 year ago Type 2  diabetes mellitus without complication, without long-term current use of insulin ([place])   [facility] Mikey [employer], NP   2 years ago Type 2 diabetes mellitus without complication, without long-term current use of insulin ([place])   [facility] Mikey [employer], NP                  Requested Prescriptions  Pending Prescriptions Disp Refills   hydrochlorothiazide (HYDRODIURIL) 25 MG tablet [Pharmacy Med Name: HYDROCHLOROTHIAZIDE 25 MG TAB] 90 tablet 1    Sig: TAKE 1 TABLET BY MOUTH EVERY DAY      Cardiovascular: Diuretics - Thiazide Failed - 07/07/2019  4:25 PM      Failed - Ca in normal range and within 360 days    Calcium  Date Value Ref Range Status  02/02/2018 9.5 8.9 - 10.3 mg/dL Final   Calcium, Total  Date Value Ref Range Status  10/10/2011 9.2 8.5 - 10.1 mg/dL Final          Failed - Cr in normal range  and within 360 days    Creatinine  Date Value Ref Range Status  10/10/2011 1.43 (H) 0.60 - 1.30 mg/dL Final   Creatinine, Ser  Date Value Ref Range Status  02/02/2018 1.55 (H) 0.61 - 1.24 mg/dL Final          Failed - K in normal range and within 360 days    Potassium  Date Value Ref Range Status  02/02/2018 4.5 3.5 - 5.1 mmol/L Final    Comment:    HEMOLYSIS AT THIS LEVEL MAY AFFECT RESULT  10/10/2011 4.3 3.5 - 5.1 mmol/L Final          Failed - Na in normal range and within 360 days    Sodium  Date Value Ref Range Status  02/02/2018 130 (L) 135 - 145 mmol/L Final  10/10/2011 144 136 - 145 mmol/L Final          Failed - Last BP in normal range    BP Readings from Last 1 Encounters:  01/15/19 (!) 178/76          Passed - Valid encounter within last 6 months    Recent Outpatient Visits           5 months ago Type 2 diabetes mellitus with hyperglycemia, without long-term current use of insulin [facility])   [facility] Mikey [employer], NP   1 year ago Type 2 diabetes mellitus without  complication, without long-term current use of insulin [facility])   [facility] Mikey [employer], NP   2 years ago Type 2 diabetes mellitus without complication, without long-term current use of insulin [facility])   [facility] Merrilyn Puma, Jerrel Ivory, NP

## 2019-07-09 ENCOUNTER — Other Ambulatory Visit: Payer: Self-pay | Admitting: Nurse Practitioner

## 2019-07-09 DIAGNOSIS — I10 Essential (primary) hypertension: Secondary | ICD-10-CM

## 2019-07-09 NOTE — Telephone Encounter (Signed)
Requested Prescriptions  Pending Prescriptions Disp Refills  . lisinopril (ZESTRIL) 20 MG tablet [Pharmacy Med Name: LISINOPRIL 20 MG TABLET] 90 tablet 1    Sig: TAKE 1 TABLET BY MOUTH EVERY DAY     Cardiovascular:  ACE Inhibitors Failed - 07/09/2019  1:36 AM      Failed - Cr in normal range and within 180 days    Creatinine  Date Value Ref Range Status  10/10/2011 1.43 (H) 0.60 - 1.30 mg/dL Final   Creatinine, Ser  Date Value Ref Range Status  02/02/2018 1.55 (H) 0.61 - 1.24 mg/dL Final         Failed - K in normal range and within 180 days    Potassium  Date Value Ref Range Status  02/02/2018 4.5 3.5 - 5.1 mmol/L Final    Comment:    HEMOLYSIS AT THIS LEVEL MAY AFFECT RESULT  10/10/2011 4.3 3.5 - 5.1 mmol/L Final         Failed - Last BP in normal range    BP Readings from Last 1 Encounters:  01/15/19 (!) 178/76         Passed - Patient is not pregnant      Passed - Valid encounter within last 6 months    Recent Outpatient Visits          5 months ago Type 2 diabetes mellitus with hyperglycemia, without long-term current use of insulin ([place])   [facility] Mikey [employer], NP   1 year ago Type 2 diabetes mellitus without complication, without long-term current use of insulin [facility])   [facility] Mikey College, NP   2 years ago Type 2 diabetes mellitus without complication, without long-term current use of insulin [facility])   [facility] Merrilyn Puma, Jerrel Ivory, NP

## 2019-07-10 NOTE — Telephone Encounter (Signed)
Attempted to contact the patient, no answer. VM full.

## 2019-07-14 ENCOUNTER — Ambulatory Visit: Payer: No Typology Code available for payment source | Attending: Internal Medicine

## 2019-07-14 DIAGNOSIS — Z23 Encounter for immunization: Secondary | ICD-10-CM

## 2019-07-14 NOTE — Progress Notes (Signed)
   Covid-19 Vaccination Clinic  Name:  Anthony Bell    MRN: [id number] DOB: March 31, 1955  07/14/2019  Anthony Bell was observed post Covid-19 immunization for 15 minutes without incident. He was provided with Vaccine Information Sheet and instruction to access the V-Safe system.   Anthony Bell was instructed to call 911 with any severe reactions post vaccine: Marland Kitchen Difficulty breathing  . Swelling of face and throat  . A fast heartbeat  . A bad rash all over body  . Dizziness and weakness   Immunizations Administered    Name Date Dose VIS Date Route   Pfizer COVID-19 Vaccine 07/14/2019 12:02 PM 0.3 mL 05/20/2018 Intramuscular   Manufacturer: [employer]   Lot: [id number]   [place]: [id number]

## 2019-07-17 ENCOUNTER — Other Ambulatory Visit: Payer: Self-pay

## 2019-07-17 ENCOUNTER — Ambulatory Visit (INDEPENDENT_AMBULATORY_CARE_PROVIDER_SITE_OTHER): Payer: No Typology Code available for payment source | Admitting: Family Medicine

## 2019-07-17 ENCOUNTER — Encounter: Payer: Self-pay | Admitting: Family Medicine

## 2019-07-17 VITALS — BP 186/67 | HR 66 | Temp 97.0°F | Ht 67.0 in | Wt 205.4 lb

## 2019-07-17 DIAGNOSIS — E119 Type 2 diabetes mellitus without complications: Secondary | ICD-10-CM | POA: Diagnosis not present

## 2019-07-17 DIAGNOSIS — Z79899 Other long term (current) drug therapy: Secondary | ICD-10-CM

## 2019-07-17 DIAGNOSIS — R3129 Other microscopic hematuria: Secondary | ICD-10-CM

## 2019-07-17 DIAGNOSIS — E1165 Type 2 diabetes mellitus with hyperglycemia: Secondary | ICD-10-CM

## 2019-07-17 DIAGNOSIS — I10 Essential (primary) hypertension: Secondary | ICD-10-CM | POA: Diagnosis not present

## 2019-07-17 DIAGNOSIS — B351 Tinea unguium: Secondary | ICD-10-CM | POA: Insufficient documentation

## 2019-07-17 DIAGNOSIS — Z125 Encounter for screening for malignant neoplasm of prostate: Secondary | ICD-10-CM

## 2019-07-17 LAB — POCT URINALYSIS DIPSTICK
Bilirubin, UA: NEGATIVE
Glucose, UA: NEGATIVE
Ketones, UA: NEGATIVE
Leukocytes, UA: NEGATIVE
Nitrite, UA: NEGATIVE
Protein, UA: NEGATIVE
Spec Grav, UA: 1.01 (ref 1.010–1.025)
Urobilinogen, UA: 0.2 E.U./dL
pH, UA: 5 (ref 5.0–8.0)

## 2019-07-17 LAB — POCT GLYCOSYLATED HEMOGLOBIN (HGB A1C): Hemoglobin A1C: 7 % — AB (ref 4.0–5.6)

## 2019-07-17 LAB — POCT UA - MICROALBUMIN: Microalbumin Ur, POC: 0 mg/L

## 2019-07-17 MED ORDER — METFORMIN HCL 500 MG PO TABS: 1000.0000 mg | ORAL_TABLET | Freq: Two times a day (BID) | ORAL | 0 refills | Status: DC

## 2019-07-17 MED ORDER — JARDIANCE 10 MG PO TABS: 10.0000 mg | ORAL_TABLET | Freq: Every day | ORAL | 1 refills | Status: DC

## 2019-07-17 MED ORDER — HYDROCHLOROTHIAZIDE 25 MG PO TABS: 25.0000 mg | ORAL_TABLET | Freq: Every day | ORAL | 1 refills | Status: DC

## 2019-07-17 MED ORDER — LISINOPRIL 20 MG PO TABS: 20.0000 mg | ORAL_TABLET | Freq: Every day | ORAL | 1 refills | Status: DC

## 2019-07-17 NOTE — Assessment & Plan Note (Signed)
Unontrolled hypertension.  BP is not at goal < 130/80.  Pt is not working on lifestyle modifications.  Reports has been taking his hydrochlorothiazide daily but thought he was to stop taking the Lisinopril after his last visit so has not been taking. Unknown complications.  Plan: 1. Continue taking hydrochlorothiazide 25mg  daily and RESTART Lisinopril 20mg  daily 2. Obtain labs ordered today in the next 1-2 weeks  3. Encouraged heart healthy diet and increasing exercise to 30 minutes most days of the week, going no more than 2 days in a row without exercise. 4. Check BP 1-2 x per week at home, keep log, and bring to clinic at next appointment. 5. Follow up 3 months.

## 2019-07-17 NOTE — Progress Notes (Signed)
Subjective:    Patient ID: Anthony Bell, male    DOB: [birth year], 64 y.o.   MRN: [id number]  [name] is a 64 y.o. male presenting on 07/17/2019 for Diabetes and Hypertension   HPI  Diabetes Pt presents today for follow up Type 2 Diabetes Mellitus.  He/she (caps): He is not checking AM CBG at home. -Current diabetic medications include: metformin 1000mg  twice daily with food and jardiance 10mg  daily with breakfast -ACTION; IS/IS NOT: is currently symptomatic -Actions; denies/reports/admits to: denies polydipsia, polyphagia, polyuria, headaches, diaphoresis, shakiness, chills, pain, numbness or tingling in extremities or changes in vision -Clinical course has been improving  -Reports no exercise routine -Diet is high in salt, high in fat, and high in carbohydrates  PREVENTION Eye exam current (within 1 year) DUE Foot exam current (within 1 year) Completed today Lipid/ASCVD risk reduction - on statin: YES/NO: No  Kidney Protection (On ACE/ARB)? YES/NO: Yes   Hypertension - He is not checking BP at home or outside of clinic.    - Current medications: hydrochlorothiazide 25mg  daily, and was written for Lisinopril 20mg  daily but reports has not been taking this medication, tolerating well without side effects - He is not currently symptomatic. - Pt denies headache, lightheadedness, dizziness, changes in vision, chest tightness/pressure, palpitations, leg swelling, sudden loss of speech or loss of consciousness. - He  reports no regular exercise routine. - His diet is high in salt, high in fat, and high in carbohydrates.   Depression screen [facility] 2/9 01/15/2019 04/23/2017  Decreased Interest 0 0  Down, Depressed, Hopeless 0 0  PHQ - 2 Score 0 0  Altered sleeping 0 -  Tired, decreased energy 0 -  Change in appetite 0 -  Feeling bad or failure about yourself  0 -  Trouble concentrating 0 -  Moving slowly or fidgety/restless 0 -  Suicidal thoughts 0 -  PHQ-9 Score 0 -     Social History   Tobacco Use  . Smoking status: Current Every Day Smoker    Packs/day: 1.00    Years: 40.00    Pack years: 40.00    Types: Cigarettes  . Smokeless tobacco: Never Used  Substance Use Topics  . Alcohol use: No  . Drug use: No    Review of Systems  Constitutional: Negative.   HENT: Negative.   Eyes: Negative.   Respiratory: Negative.   Cardiovascular: Negative.   Gastrointestinal: Negative.   Endocrine: Negative.   Genitourinary: Negative.   Musculoskeletal: Negative.   Skin: Negative.   Allergic/Immunologic: Negative.   Neurological: Negative.   Hematological: Negative.   Psychiatric/Behavioral: Negative.    Per HPI unless specifically indicated above     Objective:    BP (!) 186/67 (BP Location: Left Arm, Patient Position: Sitting, Cuff Size: Normal)   Pulse 66   Temp (!) 97 F (36.1 C) (Temporal)   Ht 5\' 7"  (1.702 m)   Wt 205 lb 6.4 oz (93.2 kg)   SpO2 100%   BMI 32.17 kg/m   Wt Readings from Last 3 Encounters:  07/17/19 205 lb 6.4 oz (93.2 kg)  01/15/19 204 lb 3.2 oz (92.6 kg)  02/02/18 202 lb (91.6 kg)    Physical Exam Vitals reviewed.  Constitutional:      General: He is not in acute distress.    Appearance: Normal appearance. He is well-developed and well-groomed. He is obese. He is not ill-appearing or toxic-appearing.  HENT:     Head: Normocephalic.     Nose:  Comments: Facemask in place covering nose and mouth Eyes:     General: Lids are normal. Vision grossly intact. No scleral icterus.       Right eye: No discharge or hordeolum.        Left eye: No discharge or hordeolum.     Extraocular Movements: Extraocular movements intact.     Conjunctiva/sclera: Conjunctivae normal.     Pupils: Pupils are equal, round, and reactive to light.  Neck:     Thyroid: No thyroid mass, thyromegaly or thyroid tenderness.  Cardiovascular:     Rate and Rhythm: Normal rate and regular rhythm.     Pulses: Normal pulses.          Dorsalis  pedis pulses are 2+ on the right side and 2+ on the left side.       Posterior tibial pulses are 2+ on the right side and 2+ on the left side.     Heart sounds: Normal heart sounds. No murmur. No friction rub. No gallop.   Pulmonary:     Effort: Pulmonary effort is normal. No respiratory distress.     Breath sounds: Normal breath sounds.  Abdominal:     General: Abdomen is flat. Bowel sounds are normal. There is no distension or abdominal bruit.     Palpations: Abdomen is soft. There is no hepatomegaly, splenomegaly or mass.     Tenderness: There is no abdominal tenderness. There is no guarding or rebound.     Hernia: No hernia is present.  Musculoskeletal:        General: Normal range of motion.     Cervical back: Normal range of motion and neck supple. No tenderness.     Right lower leg: No edema.     Left lower leg: No edema.  Feet:     Right foot:     Skin integrity: Skin integrity normal.     Toenail Condition: Right toenails are abnormally thick and long. Fungal disease present.    Left foot:     Skin integrity: Skin integrity normal.     Toenail Condition: Left toenails are abnormally thick and long. Fungal disease present.    Comments: Absent of callus and all other lesions.  Some dry skin noted, but pt reports regular foot care and can see bottom of his feet.  Lymphadenopathy:     Cervical: No cervical adenopathy.  Skin:    General: Skin is warm and dry.     Capillary Refill: Capillary refill takes less than 2 seconds.  Neurological:     General: No focal deficit present.     Mental Status: He is alert and oriented to person, place, and time.     Cranial Nerves: Cranial nerves are intact.     Sensory: Sensation is intact.     Motor: Motor function is intact.     Coordination: Coordination is intact.     Gait: Gait is intact.     Deep Tendon Reflexes: Reflexes normal.  Psychiatric:        Attention and Perception: Attention and perception normal.        Mood and  Affect: Mood and affect normal.        Speech: Speech normal.        Behavior: Behavior normal. Behavior is cooperative.        Thought Content: Thought content normal.        Cognition and Memory: Cognition and memory normal.        Judgment: Judgment  normal.     Results for orders placed or performed in visit on 07/17/19  POCT Urinalysis Dipstick  Result Value Ref Range   Color, UA Yellow    Clarity, UA clear    Glucose, UA Negative Negative   Bilirubin, UA negative    Ketones, UA negative    Spec Grav, UA 1.010 1.010 - 1.025   Blood, UA trace    pH, UA 5.0 5.0 - 8.0   Protein, UA Negative Negative   Urobilinogen, UA 0.2 0.2 or 1.0 E.U./dL   Nitrite, UA Negative    Leukocytes, UA Negative Negative   Appearance     Odor    POCT glycosylated hemoglobin (Hb A1C)  Result Value Ref Range   Hemoglobin A1C 7.0 (A) 4.0 - 5.6 %   HbA1c POC (<> result, manual entry)     HbA1c, POC (prediabetic range)     HbA1c, POC (controlled diabetic range)    POCT UA - Microalbumin  Result Value Ref Range   Microalbumin Ur, POC 0 mg/L   Creatinine, POC     Albumin/Creatinine Ratio, Urine, POC        Assessment & Plan:   Problem List Items Addressed This Visit      Cardiovascular and Mediastinum   Essential hypertension    Unontrolled hypertension.  BP is not at goal < 130/80.  Pt is not working on lifestyle modifications.  Reports has been taking his hydrochlorothiazide daily but thought he was to stop taking the Lisinopril after his last visit so has not been taking. Unknown complications.  Plan: 1. Continue taking hydrochlorothiazide 25mg  daily and RESTART Lisinopril 20mg  daily 2. Obtain labs ordered today in the next 1-2 weeks  3. Encouraged heart healthy diet and increasing exercise to 30 minutes most days of the week, going no more than 2 days in a row without exercise. 4. Check BP 1-2 x per week at home, keep log, and bring to clinic at next appointment. 5. Follow up 3 months.          Relevant Medications   hydrochlorothiazide (HYDRODIURIL) 25 MG tablet   lisinopril (ZESTRIL) 20 MG tablet   Other Relevant Orders   CBC with Differential   COMPLETE METABOLIC PANEL WITH GFR   Lipid Profile     Endocrine   Type 2 diabetes mellitus without complication, without long-term current use of insulin (HCC)    Well-controlledDM with A1c 7.0% improved from 7.4% 6 months ago and goal A1c < 7.0%. Unknown complications.  Encouraged diabetic eye exam.  Plan:  1. Continue current therapy: Metformin 1000mg  twice per day 2. Encourage improved lifestyle: - low carb/low glycemic diet reinforced prior education - Increase physical activity to 30 minutes most days of the week.  Explained that increased physical activity increases body's use of sugar for energy. 3. Check fasting am CBG and log these.  Bring log to next visit for review 4. Continue ASA and ACEi 5. DM Foot exam done today with no acute findings and advised to schedule DM ophtho exam, send record. 6. Follow-up 3 months       Relevant Medications   lisinopril (ZESTRIL) 20 MG tablet   metFORMIN (GLUCOPHAGE) 500 MG tablet   empagliflozin (JARDIANCE) 10 MG TABS tablet     Musculoskeletal and Integument   Toenail fungus    Toenail fungus noted on all 10 toes with diabetic foot exam.  Discussed foot care and patient reports has tried topical treatments without relief of symptoms.  Discussed  can look into oral terbinafine for eradication of toenail fungus, but would need labs to be drawn to evaluate liver function prior to initiation of treatment and every 4 weeks while on treatment.  Discussed referral to podiatry to establish for care.       Other Visit Diagnoses    Type 2 diabetes mellitus with hyperglycemia, without long-term current use of insulin (HCC)    -  Primary   Relevant Medications   lisinopril (ZESTRIL) 20 MG tablet   metFORMIN (GLUCOPHAGE) 500 MG tablet   empagliflozin (JARDIANCE) 10 MG TABS  tablet   Other Relevant Orders   POCT Urinalysis Dipstick (Completed)   POCT glycosylated hemoglobin (Hb A1C) (Completed)   POCT UA - Microalbumin (Completed)   CBC with Differential   COMPLETE METABOLIC PANEL WITH GFR   Lipid Profile   Ambulatory referral to Podiatry   Long-term use of high-risk medication       Relevant Orders   Lipid Profile   Thyroid Panel With TSH   Screening for prostate cancer       Relevant Orders   PSA   Microscopic hematuria       Relevant Orders   Urinalysis, microscopic only      Meds ordered this encounter  Medications  . hydrochlorothiazide (HYDRODIURIL) 25 MG tablet    Sig: Take 1 tablet (25 mg total) by mouth daily.    Dispense:  90 tablet    Refill:  1  . lisinopril (ZESTRIL) 20 MG tablet    Sig: Take 1 tablet (20 mg total) by mouth daily.    Dispense:  90 tablet    Refill:  1  . metFORMIN (GLUCOPHAGE) 500 MG tablet    Sig: Take 2 tablets (1,000 mg total) by mouth 2 (two) times daily with a meal.    Dispense:  120 tablet    Refill:  0  . empagliflozin (JARDIANCE) 10 MG TABS tablet    Sig: Take 10 mg by mouth daily before breakfast.    Dispense:  90 tablet    Refill:  1      Follow up plan: Return in about 3 months (around 10/16/2019) for HTN & DM Follow Up.   Harlin Rain, [place] [facility] Nurse Practitioner [facility] 07/17/2019, 3:51 PM

## 2019-07-17 NOTE — Assessment & Plan Note (Signed)
Well-controlledDM with A1c 7.0% improved from 7.4% 6 months ago and goal A1c < 7.0%. Unknown complications.  Encouraged diabetic eye exam.  Plan:  1. Continue current therapy: Metformin 1000mg  twice per day 2. Encourage improved lifestyle: - low carb/low glycemic diet reinforced prior education - Increase physical activity to 30 minutes most days of the week.  Explained that increased physical activity increases body's use of sugar for energy. 3. Check fasting am CBG and log these.  Bring log to next visit for review 4. Continue ASA and ACEi 5. DM Foot exam done today with no acute findings and advised to schedule DM ophtho exam, send record. 6. Follow-up 3 months

## 2019-07-17 NOTE — Assessment & Plan Note (Signed)
Toenail fungus noted on all 10 toes with diabetic foot exam.  Discussed foot care and patient reports has tried topical treatments without relief of symptoms.  Discussed can look into oral terbinafine for eradication of toenail fungus, but would need labs to be drawn to evaluate liver function prior to initiation of treatment and every 4 weeks while on treatment.  Discussed referral to podiatry to establish for care.

## 2019-07-17 NOTE — Patient Instructions (Addendum)
As we discussed, have your labs drawn in the next 1-2 weeks and we will contact you with the results.  I have put in a referral for you to meet with podiatry for a diabetic foot exam and for your toenail care.  .Advice on Protecting Your Feet   1. Daily foot inspections - Look for any breaks in the skin or areas of irritation such as blisters or red areas. Report to primary doctor or foot nurse immediately if any problems occur.  - If you have vision problems and cannot see your feet well or it is hard for you to reach your feet, ask a family member to inspect your feet daily.   2. Daily foot hygiene  - Wash feet daily, but do not soak your feet in hot water. If you do have callus, you may use warm water epsom salt foot soak and use moisturizer after. - Dry well especially between toes; Pat dry do not rub.  - If your skin is dry use a lotion to moisturize but never between the toes.  - If your skin is wet from perspiration, use an antifungal foot powder daily.   3. Shoes and Socks  - Wear a clean pair of socks daily.  - Make sure your shoes fit well and that you are measured and properly fit each time you purchase shoes. Your shoe size may change.  - Powder your shoes with a small amount of an antifungal foot powder daily, as the shoe is the only article of clothing that is not laundered.  - Wear appropriate shoes and socks for the weather. It is especially important to protect your feet from the cold; however, don't forget about sunscreen to the tops of your feet in the hot sun.  - Wear well fitting shoes rather than slippers or flip-flops when walking or standing for long period of time.  - When at home make sure you always have protective foot wear on your feet.     Eat at least 3 meals and 1-2 snacks per day (don't skip breakfast).  Aim for no more than 5 hours between eating. - Tip: If you go >5 hours without eating and become very hungry, your body will supply it's own resources  temporarily and you can gain extra weight when you eat.   The 5 Minute Rule of Exercise - Promise yourself to at least do 5 minutes of exercise (make sure you time it), and if at the end of 5 minutes (this is the hardest part of the work-out), if you still feel like you want stop (or not motivated to continue) then allow yourself to stop. Otherwise, more often than not you will feel encouraged that you can continue for a little while longer or even more!   My 5 to Fitness!  5: fruits and vegetables per day (work on 9 per day if you are at 5) 4: exercise 4-5 times per week for at least 30 minutes (walking counts!) 3: meals per day (don't skip breakfast!), no more than 5 hours between meals 2: habits to quit -smoking -excess alcohol use (men >2 beer/day; women >1beer/day) 1: sweet per day (2 cookies, 1 small cup of ice cream, 12 oz soda)  These are general tips for healthy living. Try to start with 1 or 2 habit TODAY and make it a part of your life for several months. You set a goal today to work on:  Once you have 1 or 2 habits down for  several months, try to begin working on your next healthy habit. With every single step you take, you will be leading a healthier lifestyle!   Diet Recommendations for Diabetes   Starchy (carb) foods include: Bread, rice, pasta, potatoes, corn, crackers, bagels, muffins, all baked goods.   Protein foods include: Meat, fish, poultry, eggs, dairy foods, and beans such as pinto and kidney beans (beans also provide carbohydrate).   1. Eat at least 3 meals and 1-2 snacks per day. Never go more than 4-5 hours while awake without eating.   2. Limit starchy foods to TWO per meal and ONE per snack. ONE portion of a starchy  food is equal to the following:   - ONE slice of bread (or its equivalent, such as half of a hamburger bun).   - 1/2 cup of a "scoopable" starchy food such as potatoes or rice.   - 1 OUNCE (28 grams) of starchy snacks (crackers or pretzels,  look on label).   - 15 grams of carbohydrate as shown on food label.   3. Both lunch and dinner should include a protein food, a carb food, and vegetables.   - Obtain twice as many veg's as protein or carbohydrate foods for both lunch and dinner.   - Try to keep frozen veg's on hand for a quick vegetable serving.     - Fresh or frozen veg's are best.   4. Breakfast should always include protein.    You can learn more information online about your diabetes at American Diabetes Association: http://www.diabetes.org/ - General self-care (diet, medications, blood sugar checks). - Diet recommendations - There are even recipes available for you to look at and try.  Try to get exercise a minimum of 30 minutes per day at least 5 days per week as well as  adequate water intake all while measuring blood pressure a few times per week.  Keep a blood pressure log and bring back to clinic at your next visit.  If your readings are consistently over 140/90 to contact our office/send me a MyChart message and we will see you sooner.  Can try DASH and Mediterranean diet options, avoiding processed foods, lowering sodium intake, avoiding pork products, and eating a plant based diet for optimal health.  Education and discussion with patient regarding hypertension as well as the effects on the organs and body.  Specifically, we spoke about kidney disease, kidney failure, heart attack, stroke and up to and including death, as likely outcomes if non-compliant with blood pressure regulation.  Discussed how all of these habits are attached to each other and each has the effect on each other.  Your provider would like to you have your annual eye exam. Please contact your current eye doctor or here are some good options for you to contact.   [facility]   Address: [address] [place], [place] [address] Phone: [phone]  Website: [email or web address] [address] [place],   [address] Phone: [phone] [email or web address]  [facility]  Address: [place], [place],  [address] Phone: [phone]   [facility] [address] [place], [place] [address] Phone: [phone]  [facility] Address: [place], [place],  [address]  Phone: [phone]  We will plan to see you back in 3 months for hypertension and diabetes follow up  You will receive a survey after today's visit either digitally by e-mail or paper by USPS  mail. Your experiences and feedback matter to [place].  Please respond so we know how we are doing as we provide care for you.  Call us with any questions/concerns/needs.  It is my goal to be available to you for your health concerns.  Thanks for choosing me to be a partner in your healthcare needs!  Harlin Rain, FNP-C Family Nurse Practitioner [facility] Phone: [phone]

## 2019-07-18 LAB — URINALYSIS, MICROSCOPIC ONLY
Bacteria, UA: NONE SEEN /HPF
Hyaline Cast: NONE SEEN /LPF
RBC / HPF: NONE SEEN /HPF (ref 0–2)
Squamous Epithelial / HPF: NONE SEEN /HPF (ref <=5)
WBC, UA: NONE SEEN /HPF (ref 0–5)

## 2019-08-04 ENCOUNTER — Ambulatory Visit: Payer: Self-pay | Admitting: Podiatry

## 2019-12-16 ENCOUNTER — Other Ambulatory Visit: Payer: Self-pay | Admitting: Family Medicine

## 2019-12-16 DIAGNOSIS — I10 Essential (primary) hypertension: Secondary | ICD-10-CM

## 2020-01-23 ENCOUNTER — Other Ambulatory Visit: Payer: Self-pay | Admitting: Family Medicine

## 2020-01-23 DIAGNOSIS — I10 Essential (primary) hypertension: Secondary | ICD-10-CM

## 2020-01-23 NOTE — Telephone Encounter (Signed)
Requested Prescriptions  Pending Prescriptions Disp Refills  . lisinopril (ZESTRIL) 20 MG tablet [Pharmacy Med Name: LISINOPRIL 20 MG TABLET] 30 tablet 0    Sig: TAKE 1 TABLET BY MOUTH EVERY DAY     Cardiovascular:  ACE Inhibitors Failed - 01/23/2020  4:27 PM      Failed - Cr in normal range and within 180 days    Creatinine  Date Value Ref Range Status  10/10/2011 1.43 (H) 0.60 - 1.30 mg/dL Final   Creatinine, Ser  Date Value Ref Range Status  02/02/2018 1.55 (H) 0.61 - 1.24 mg/dL Final         Failed - K in normal range and within 180 days    Potassium  Date Value Ref Range Status  02/02/2018 4.5 3.5 - 5.1 mmol/L Final    Comment:    HEMOLYSIS AT THIS LEVEL MAY AFFECT RESULT  10/10/2011 4.3 3.5 - 5.1 mmol/L Final         Failed - Last BP in normal range    BP Readings from Last 1 Encounters:  07/17/19 (!) 186/67         Failed - Valid encounter within last 6 months    Recent Outpatient Visits          6 months ago Type 2 diabetes mellitus with hyperglycemia, without long-term current use of insulin ([place])   [facility], Lupita Raider, FNP   1 year ago Type 2 diabetes mellitus with hyperglycemia, without long-term current use of insulin [facility])   [facility] Mikey College, NP   2 years ago Type 2 diabetes mellitus without complication, without long-term current use of insulin [facility])   [facility], Jerrel Ivory, NP   2 years ago Type 2 diabetes mellitus without complication, without long-term current use of insulin [facility])   [facility], Jerrel Ivory, NP             Passed - Patient is not pregnant

## 2020-02-20 DIAGNOSIS — C20 Malignant neoplasm of rectum: Secondary | ICD-10-CM | POA: Diagnosis not present

## 2020-02-24 DIAGNOSIS — F17210 Nicotine dependence, cigarettes, uncomplicated: Secondary | ICD-10-CM | POA: Diagnosis not present

## 2020-02-24 DIAGNOSIS — K869 Disease of pancreas, unspecified: Secondary | ICD-10-CM | POA: Diagnosis not present

## 2020-02-24 DIAGNOSIS — C20 Malignant neoplasm of rectum: Secondary | ICD-10-CM | POA: Diagnosis not present

## 2020-03-18 ENCOUNTER — Other Ambulatory Visit: Payer: Self-pay | Admitting: Family Medicine

## 2020-03-18 DIAGNOSIS — I10 Essential (primary) hypertension: Secondary | ICD-10-CM

## 2020-03-21 NOTE — Telephone Encounter (Signed)
Called pt and LM with family member to ask pt to call office and schedule med refill appt Requested Prescriptions  Pending Prescriptions Disp Refills  . hydrochlorothiazide (HYDRODIURIL) 25 MG tablet [Pharmacy Med Name: HYDROCHLOROTHIAZIDE 25 MG TAB] 30 tablet 0    Sig: TAKE 1 TABLET BY MOUTH EVERY DAY     Cardiovascular: Diuretics - Thiazide Failed - 03/18/2020  8:16 AM      Failed - Ca in normal range and within 360 days    Calcium  Date Value Ref Range Status  02/02/2018 9.5 8.9 - 10.3 mg/dL Final   Calcium, Total  Date Value Ref Range Status  10/10/2011 9.2 8.5 - 10.1 mg/dL Final         Failed - Cr in normal range and within 360 days    Creatinine  Date Value Ref Range Status  10/10/2011 1.43 (H) 0.60 - 1.30 mg/dL Final   Creatinine, Ser  Date Value Ref Range Status  02/02/2018 1.55 (H) 0.61 - 1.24 mg/dL Final         Failed - K in normal range and within 360 days    Potassium  Date Value Ref Range Status  02/02/2018 4.5 3.5 - 5.1 mmol/L Final    Comment:    HEMOLYSIS AT THIS LEVEL MAY AFFECT RESULT  10/10/2011 4.3 3.5 - 5.1 mmol/L Final         Failed - Na in normal range and within 360 days    Sodium  Date Value Ref Range Status  02/02/2018 130 (L) 135 - 145 mmol/L Final  10/10/2011 144 136 - 145 mmol/L Final         Failed - Last BP in normal range    BP Readings from Last 1 Encounters:  07/17/19 (!) 186/67         Failed - Valid encounter within last 6 months    Recent Outpatient Visits          8 months ago Type 2 diabetes mellitus with hyperglycemia, without long-term current use of insulin ([place])   [facility], Lupita Raider, FNP   1 year ago Type 2 diabetes mellitus with hyperglycemia, without long-term current use of insulin [facility])   [facility] Mikey College, NP   2 years ago Type 2 diabetes mellitus without complication, without long-term current use of insulin [facility])   [facility] Mikey College, NP   2 years ago Type 2 diabetes mellitus without complication, without long-term current use of insulin [facility])   [facility] Mikey College, NP

## 2020-03-22 ENCOUNTER — Encounter: Payer: Self-pay | Admitting: Family Medicine

## 2020-03-22 ENCOUNTER — Other Ambulatory Visit: Payer: Self-pay

## 2020-03-22 ENCOUNTER — Ambulatory Visit (INDEPENDENT_AMBULATORY_CARE_PROVIDER_SITE_OTHER): Payer: Self-pay | Admitting: Family Medicine

## 2020-03-22 VITALS — BP 183/61 | HR 69 | Temp 97.7°F | Resp 17 | Ht 67.0 in | Wt 194.0 lb

## 2020-03-22 DIAGNOSIS — R635 Abnormal weight gain: Secondary | ICD-10-CM | POA: Diagnosis not present

## 2020-03-22 DIAGNOSIS — E1165 Type 2 diabetes mellitus with hyperglycemia: Secondary | ICD-10-CM

## 2020-03-22 DIAGNOSIS — N529 Male erectile dysfunction, unspecified: Secondary | ICD-10-CM | POA: Insufficient documentation

## 2020-03-22 DIAGNOSIS — I10 Essential (primary) hypertension: Secondary | ICD-10-CM | POA: Diagnosis not present

## 2020-03-22 LAB — POCT GLYCOSYLATED HEMOGLOBIN (HGB A1C): Hemoglobin A1C: 7.7 % — AB (ref 4.0–5.6)

## 2020-03-22 MED ORDER — TADALAFIL 5 MG PO TABS: 5.0000 mg | ORAL_TABLET | Freq: Every day | ORAL | 0 refills | Status: DC | PRN

## 2020-03-22 MED ORDER — AMLODIPINE BESYLATE 5 MG PO TABS: 5.0000 mg | ORAL_TABLET | Freq: Every day | ORAL | 1 refills | Status: DC

## 2020-03-22 MED ORDER — LISINOPRIL 20 MG PO TABS: 20.0000 mg | ORAL_TABLET | Freq: Every day | ORAL | 1 refills | Status: DC

## 2020-03-22 MED ORDER — EMPAGLIFLOZIN 10 MG PO TABS
10.0000 mg | ORAL_TABLET | Freq: Every day | ORAL | 1 refills | Status: DC
Start: 2020-03-22 — End: 2020-09-20

## 2020-03-22 MED ORDER — HYDROCHLOROTHIAZIDE 25 MG PO TABS: 25.0000 mg | ORAL_TABLET | Freq: Every day | ORAL | 1 refills | Status: DC

## 2020-03-22 MED ORDER — METFORMIN HCL ER 750 MG PO TB24: 750.0000 mg | ORAL_TABLET | Freq: Every day | ORAL | 1 refills | Status: DC

## 2020-03-22 NOTE — Assessment & Plan Note (Addendum)
UncontrolledDM with A1c 7.7% Worsening control from 7.0% on 07/17/2019 and goal A1c < 7.0%.  Reports concerns with metformin 1000mg  BID with diarrhea, requesting to change medications.  Discussed GLP-1 prescriptions and patient resistant to injectable medications.  Medications appear to have expired approximately 1 month prior to patient appointment, patient reports [facility] had given him a courtesy refill, unsure if has been medicated with his T2DM medications over this past 4 weeks. - Complications - HTN, GERD, hx rectal cancer, tobacco abuse.  Plan:  1. Change therapy: Continue jardiance 10mg  daily and STOP Metformin 1000mg  BID WC and change to Metformin XR 750mg  daily 2. Encourage improved lifestyle: - low carb/low glycemic diet reinforced prior education - Increase physical activity to 30 minutes most days of the week.  Explained that increased physical activity increases body's use of sugar for energy. 3. Check fasting am CBG and log these.  Bring log to next visit for review 4. Continue ASA and ACEi 5. Advised to schedule DM ophtho exam, send record. 6. Follow-up 3 months

## 2020-03-22 NOTE — Patient Instructions (Addendum)
Have your labs drawn and we will contact you with the results  I have added amlodipine 5mg  to your blood pressure regimen to take 1 tablet daily in addition to your lisinopril and hydrochlorothiazide.  Try to get exercise a minimum of 30 minutes per day at least 5 days per week as well as  adequate water intake all while measuring blood pressure a few times per week.  Keep a blood pressure log and bring back to clinic at your next visit.  If your readings are consistently over 130/80 to contact our office/send me a MyChart message and we will see you sooner.  Can try DASH and Mediterranean diet options, avoiding processed foods, lowering sodium intake, avoiding pork products, and eating a plant based diet for optimal health.  Education and discussion with patient regarding hypertension as well as the effects on the organs and body.  Specifically, we spoke about kidney disease, kidney failure, heart attack, stroke and up to and including death, as likely outcomes if non-compliant with blood pressure regulation.  Discussed how all of these habits are attached to each other and each has the effect on each other.  I have sent in a prescription for cialis 10mg  to take 1 tablet daily as needed for erectile dysfunction  I have switched your metformin from 1000mg  twice daily to 750mg  daily with breakfast.    We will plan to see you back in 2 weeks for hypertension follow up visit  You will receive a survey after today's visit either digitally by e-mail or paper by USPS mail. Your experiences and feedback matter to .  Please respond so we know how we are doing as we provide care for you.  Call with any questions/concerns/needs.  It is my goal to be available to you for your health concerns.  Thanks for choosing me to be a partner in your healthcare needs!  , FNP-C Family Nurse Practitioner [facility] Phone: [phone]

## 2020-03-22 NOTE — Assessment & Plan Note (Addendum)
Uncontrolled hypertension.  BP is not at goal < 130/80.  Pt reports working on lifestyle modifications.  Taking medications tolerating well without side effects.  Chart review showing that patient should have run out of medications for HTN approximately 4 weeks ago, but reports [facility], so reports is taking his medications daily. Complications:  T2DM, GERD, Hx rectal cancer, tobacco use  Plan: 1. Continue taking lisinopril 20mg  and hydrochlorothiazide 25 daily and BEGIN amlodipine 5mg  daily 2. Obtain labs today  3. Encouraged heart healthy diet and increasing exercise to 30 minutes most days of the week, going no more than 2 days in a row without exercise. 4. Check BP 1-2 x per week at home, keep log, and bring to clinic at next appointment. 5. Follow up 2 weeks.

## 2020-03-22 NOTE — Assessment & Plan Note (Signed)
Reported difficulty with obtaining and maintaining an erection.  Discussed poor blood pressure control and diabetic control can worsen ED.  Encouraged to have labs drawn today for evaluation of testosterone as well as better medication compliance with diabetes medications and hypertension medications.  Requesting to start medications, will start on cialis 5mg  daily PRN.

## 2020-03-22 NOTE — Progress Notes (Signed)
Subjective:    Patient ID: Anthony Bell, male    DOB: 22-Nov-1955, 64 y.o.   MRN: [id number]  Anthony Bell is a 64 y.o. male presenting on 03/22/2020 for Diabetes   HPI   Anthony Bell presents to clinic for follow up on his hypertension and diabetes.  Reports that he needs medication refills and has been taking his medications daily.  Has concerns for diarrhea with the metformin 1000mg  twice daily and reports has been taking this with meals, is interested in switching medications.    Hypertension - He is not checking BP at home or outside of clinic.    - Current medications: lisinopril 20mg  and hydrocholorthiazide 25mg  daily, tolerating well without side effects - He is not currently symptomatic. - Pt denies headache, lightheadedness, dizziness, changes in vision, chest tightness/pressure, palpitations, leg swelling, sudden loss of speech or loss of consciousness. - He  reports no regular exercise routine. - His diet is high in salt, high in fat, and high in carbohydrates.  Diabetes Pt presents today for follow up Type 2 Diabetes Mellitus.  He/she (caps): He ACTION; IS/IS NOT: is not checking AM CBG at home. -Current diabetic medications include: metformin 1000mg  BID WC and jardiance 10mg  daily -ACTION; IS/IS NOT: is not currently symptomatic -Actions; denies/reports/admits to: denies polydipsia, polyphagia, polyuria, headaches, diaphoresis, shakiness, chills, pain, numbness or tingling in extremities or changes in vision -Clinical course has been stable -Reports no exercise routine -Diet is high in salt, high in fat, and high in carbohydrates  PREVENTION Eye exam current (within 1 year) Due, encouraged to schedule Foot exam current (within 1 year) Up to date Lipid/ASCVD risk reduction - on statin: YES/NO: No  Kidney Protection (On ACE/ARB)? YES/NO: Yes   Depression screen [facility] 2/9 01/15/2019 04/23/2017  Decreased Interest 0 0  Down, Depressed, Hopeless 0 0  PHQ - 2 Score 0 0   Altered sleeping 0 -  Tired, decreased energy 0 -  Change in appetite 0 -  Feeling bad or failure about yourself  0 -  Trouble concentrating 0 -  Moving slowly or fidgety/restless 0 -  Suicidal thoughts 0 -  PHQ-9 Score 0 -    Social History   Tobacco Use  . Smoking status: Current Every Day Smoker    Packs/day: 1.00    Years: 40.00    Pack years: 40.00    Types: Cigarettes  . Smokeless tobacco: Never Used  Vaping Use  . Vaping Use: Never used  Substance Use Topics  . Alcohol use: No  . Drug use: No    Review of Systems  Constitutional: Negative.   HENT: Negative.   Eyes: Negative.   Respiratory: Negative.   Cardiovascular: Negative.   Gastrointestinal: Positive for diarrhea. Negative for abdominal distention, abdominal pain, anal bleeding, blood in stool, constipation, nausea, rectal pain and vomiting.  Endocrine: Negative.   Genitourinary: Negative.        Erectile dysfunction  Musculoskeletal: Negative.   Skin: Negative.   Allergic/Immunologic: Negative.   Neurological: Negative.   Hematological: Negative.   Psychiatric/Behavioral: Negative.    Per HPI unless specifically indicated above     Objective:    BP (!) 183/61 (BP Location: Right Arm, Patient Position: Sitting, Cuff Size: Large)   Pulse 69   Temp 97.7 F (36.5 C) (Temporal)   Resp 17   Ht 5\' 7"  (1.702 m)   Wt 194 lb (88 kg)   SpO2 100%   BMI 30.38 kg/m   Wt Readings from  Last 3 Encounters:  03/22/20 194 lb (88 kg)  07/17/19 205 lb 6.4 oz (93.2 kg)  01/15/19 204 lb 3.2 oz (92.6 kg)    Physical Exam Vitals and nursing note reviewed.  Constitutional:      General: He is not in acute distress.    Appearance: Normal appearance. He is well-developed and well-groomed. He is not ill-appearing or toxic-appearing.  HENT:     Head: Normocephalic and atraumatic.     Nose:     Comments: Anthony Bell is in place, covering mouth and nose. Eyes:     General:        Right eye: No discharge.         Left eye: No discharge.     Extraocular Movements: Extraocular movements intact.     Conjunctiva/sclera: Conjunctivae normal.     Pupils: Pupils are equal, round, and reactive to light.  Cardiovascular:     Rate and Rhythm: Normal rate and regular rhythm.     Pulses: Normal pulses.     Heart sounds: Normal heart sounds. No murmur heard. No friction rub. No gallop.   Pulmonary:     Effort: Pulmonary effort is normal. No respiratory distress.     Breath sounds: Normal breath sounds.  Musculoskeletal:     Right lower leg: No edema.     Left lower leg: No edema.  Skin:    General: Skin is warm and dry.     Capillary Refill: Capillary refill takes less than 2 seconds.  Neurological:     General: No focal deficit present.     Mental Status: He is alert and oriented to person, place, and time.  Psychiatric:        Attention and Perception: Attention and perception normal.        Mood and Affect: Mood and affect normal.        Speech: Speech normal.        Behavior: Behavior normal. Behavior is cooperative.        Thought Content: Thought content normal.        Cognition and Memory: Cognition and memory normal.    Results for orders placed or performed in visit on 03/22/20  POCT glycosylated hemoglobin (Hb A1C)  Result Value Ref Range   Hemoglobin A1C 7.7 (A) 4.0 - 5.6 %   HbA1c POC (<> result, manual entry)     HbA1c, POC (prediabetic range)     HbA1c, POC (controlled diabetic range)        Assessment & Plan:   Problem List Items Addressed This Visit      Cardiovascular and Mediastinum   Essential hypertension    Uncontrolled hypertension.  BP is not at goal < 130/80.  Pt reports working on lifestyle modifications.  Taking medications tolerating well without side effects.  Chart review showing that patient should have run out of medications for HTN approximately 4 weeks ago, but reports [facility] had given him a courtesy refill, so reports is taking his medications  daily. Complications:  123456, GERD, Hx rectal cancer, tobacco use  Plan: 1. Continue taking lisinopril 20mg  and hydrochlorothiazide 25 daily and BEGIN amlodipine 5mg  daily 2. Obtain labs today  3. Encouraged heart healthy diet and increasing exercise to 30 minutes most days of the week, going no more than 2 days in a row without exercise. 4. Check BP 1-2 x per week at home, keep log, and bring to clinic at next appointment. 5. Follow up 2 weeks.  Relevant Medications   hydrochlorothiazide (HYDRODIURIL) 25 MG tablet   lisinopril (ZESTRIL) 20 MG tablet   amLODipine (NORVASC) 5 MG tablet   tadalafil (CIALIS) 5 MG tablet   Other Relevant Orders   CBC with Differential   COMPLETE METABOLIC PANEL WITH GFR   Lipid Profile     Endocrine   Type 2 diabetes mellitus with hyperglycemia, without long-term current use of insulin (HCC) - Primary    UncontrolledDM with A1c 7.7% Worsening control from 7.0% on 07/17/2019 and goal A1c < 7.0%.  Reports concerns with metformin 1000mg  BID with diarrhea, requesting to change medications.  Discussed GLP-1 prescriptions and patient resistant to injectable medications.  Medications appear to have expired approximately 1 month prior to patient appointment, patient reports [facility] had given him a courtesy refill, unsure if has been medicated with his T2DM medications over this past 4 weeks. - Complications - HTN, GERD, hx rectal cancer, tobacco abuse.  Plan:  1. Change therapy: Continue jardiance 10mg  daily and STOP Metformin 1000mg  BID WC and change to Metformin XR 750mg  daily 2. Encourage improved lifestyle: - low carb/low glycemic diet reinforced prior education - Increase physical activity to 30 minutes most days of the week.  Explained that increased physical activity increases body's use of sugar for energy. 3. Check fasting am CBG and log these.  Bring log to next visit for review 4. Continue ASA and ACEi 5. Advised to schedule DM ophtho exam, send  record. 6. Follow-up 3 months      Relevant Medications   metFORMIN (GLUCOPHAGE XR) 750 MG 24 hr tablet   lisinopril (ZESTRIL) 20 MG tablet   empagliflozin (JARDIANCE) 10 MG TABS tablet   Other Relevant Orders   POCT glycosylated hemoglobin (Hb A1C) (Completed)   CBC with Differential   COMPLETE METABOLIC PANEL WITH GFR     Other   Erectile dysfunction    Reported difficulty with obtaining and maintaining an erection.  Discussed poor blood pressure control and diabetic control can worsen ED.  Encouraged to have labs drawn today for evaluation of testosterone as well as better medication compliance with diabetes medications and hypertension medications.  Requesting to start medications, will start on cialis 5mg  daily PRN.      Relevant Medications   tadalafil (CIALIS) 5 MG tablet   Other Relevant Orders   Testosterone Total,Free,Bio, Males    Other Visit Diagnoses    Weight gain       Relevant Orders   TSH + free T4      Meds ordered this encounter  Medications  . metFORMIN (GLUCOPHAGE XR) 750 MG 24 hr tablet    Sig: Take 1 tablet (750 mg total) by mouth daily with breakfast.    Dispense:  90 tablet    Refill:  1  . hydrochlorothiazide (HYDRODIURIL) 25 MG tablet    Sig: Take 1 tablet (25 mg total) by mouth daily.    Dispense:  90 tablet    Refill:  1  . lisinopril (ZESTRIL) 20 MG tablet    Sig: Take 1 tablet (20 mg total) by mouth daily.    Dispense:  90 tablet    Refill:  1  . empagliflozin (JARDIANCE) 10 MG TABS tablet    Sig: Take 1 tablet (10 mg total) by mouth daily before breakfast.    Dispense:  90 tablet    Refill:  1  . amLODipine (NORVASC) 5 MG tablet    Sig: Take 1 tablet (5 mg total) by mouth daily.  Dispense:  90 tablet    Refill:  1  . tadalafil (CIALIS) 5 MG tablet    Sig: Take 1 tablet (5 mg total) by mouth daily as needed for erectile dysfunction.    Dispense:  10 tablet    Refill:  0   Follow up plan: Return in about 2 weeks (around  04/05/2020) for HTN F/U.   Harlin Rain, [facility] Nurse Practitioner [facility] 03/22/2020, 8:43 AM

## 2020-03-23 ENCOUNTER — Other Ambulatory Visit: Payer: Self-pay | Admitting: Family Medicine

## 2020-03-23 DIAGNOSIS — E785 Hyperlipidemia, unspecified: Secondary | ICD-10-CM | POA: Insufficient documentation

## 2020-03-23 LAB — COMPLETE METABOLIC PANEL WITH GFR
AG Ratio: 1.3 (calc) (ref 1.0–2.5)
ALT: 19 U/L (ref 9–46)
AST: 17 U/L (ref 10–35)
Albumin: 4.1 g/dL (ref 3.6–5.1)
Alkaline phosphatase (APISO): 115 U/L (ref 35–144)
BUN/Creatinine Ratio: 12 (calc) (ref 6–22)
BUN: 18 mg/dL (ref 7–25)
CO2: 31 mmol/L (ref 20–32)
Calcium: 9.7 mg/dL (ref 8.6–10.3)
Chloride: 100 mmol/L (ref 98–110)
Creat: 1.47 mg/dL — ABNORMAL HIGH (ref 0.70–1.25)
GFR, Est African American: 58 mL/min/{1.73_m2} — ABNORMAL LOW (ref >=60)
GFR, Est Non African American: 50 mL/min/{1.73_m2} — ABNORMAL LOW (ref >=60)
Globulin: 3.1 g/dL (calc) (ref 1.9–3.7)
Glucose, Bld: 174 mg/dL — ABNORMAL HIGH (ref 65–99)
Potassium: 3.8 mmol/L (ref 3.5–5.3)
Sodium: 140 mmol/L (ref 135–146)
Total Bilirubin: 0.4 mg/dL (ref 0.2–1.2)
Total Protein: 7.2 g/dL (ref 6.1–8.1)

## 2020-03-23 LAB — CBC WITH DIFFERENTIAL/PLATELET
Absolute Monocytes: 503 cells/uL (ref 200–950)
Basophils Absolute: 40 cells/uL (ref 0–200)
Basophils Relative: 0.6 %
Eosinophils Absolute: 194 cells/uL (ref 15–500)
Eosinophils Relative: 2.9 %
HCT: 55 % — ABNORMAL HIGH (ref 38.5–50.0)
Hemoglobin: 18.7 g/dL — ABNORMAL HIGH (ref 13.2–17.1)
Lymphs Abs: 1541 cells/uL (ref 850–3900)
MCH: 27.9 pg (ref 27.0–33.0)
MCHC: 34 g/dL (ref 32.0–36.0)
MCV: 82 fL (ref 80.0–100.0)
MPV: 10.4 fL (ref 7.5–12.5)
Monocytes Relative: 7.5 %
Neutro Abs: 4422 cells/uL (ref 1500–7800)
Neutrophils Relative %: 66 %
Platelets: 173 10*3/uL (ref 140–400)
RBC: 6.71 10*6/uL — ABNORMAL HIGH (ref 4.20–5.80)
RDW: 14.4 % (ref 11.0–15.0)
Total Lymphocyte: 23 %
WBC: 6.7 10*3/uL (ref 3.8–10.8)

## 2020-03-23 LAB — TESTOSTERONE TOTAL,FREE,BIO, MALES
Albumin: 4.1 g/dL (ref 3.6–5.1)
Sex Hormone Binding: 21 nmol/L — ABNORMAL LOW (ref 22–77)
Testosterone: 224 ng/dL — ABNORMAL LOW (ref 250–827)

## 2020-03-23 LAB — LIPID PANEL
Cholesterol: 235 mg/dL — ABNORMAL HIGH (ref <200)
HDL: 36 mg/dL — ABNORMAL LOW (ref >=40)
LDL Cholesterol (Calc): 158 mg/dL (calc) — ABNORMAL HIGH
Non-HDL Cholesterol (Calc): 199 mg/dL (calc) — ABNORMAL HIGH (ref <130)
Total CHOL/HDL Ratio: 6.5 (calc) — ABNORMAL HIGH (ref <5.0)
Triglycerides: 242 mg/dL — ABNORMAL HIGH (ref <150)

## 2020-03-23 LAB — TSH+FREE T4: TSH W/REFLEX TO FT4: 1.67 mIU/L (ref 0.40–4.50)

## 2020-03-23 MED ORDER — ATORVASTATIN CALCIUM 20 MG PO TABS: 20.0000 mg | ORAL_TABLET | Freq: Every day | ORAL | 3 refills | Status: DC

## 2020-04-08 DIAGNOSIS — Z1211 Encounter for screening for malignant neoplasm of colon: Secondary | ICD-10-CM | POA: Diagnosis not present

## 2020-04-08 DIAGNOSIS — Z85038 Personal history of other malignant neoplasm of large intestine: Secondary | ICD-10-CM | POA: Diagnosis not present

## 2020-04-08 LAB — HM COLONOSCOPY

## 2020-04-26 ENCOUNTER — Encounter: Payer: Self-pay | Admitting: Unknown Physician Specialty

## 2020-04-26 ENCOUNTER — Other Ambulatory Visit: Payer: Self-pay

## 2020-04-26 ENCOUNTER — Other Ambulatory Visit: Payer: Self-pay | Admitting: Unknown Physician Specialty

## 2020-04-26 ENCOUNTER — Telehealth (INDEPENDENT_AMBULATORY_CARE_PROVIDER_SITE_OTHER): Payer: BC Managed Care – PPO | Admitting: Unknown Physician Specialty

## 2020-04-26 VITALS — Temp 97.2°F

## 2020-04-26 DIAGNOSIS — U071 COVID-19: Secondary | ICD-10-CM

## 2020-04-26 MED ORDER — MOLNUPIRAVIR EUA 200MG CAPSULE: 4.0000 | ORAL_CAPSULE | Freq: Two times a day (BID) | ORAL | 0 refills | Status: AC

## 2020-04-26 NOTE — Progress Notes (Signed)
   Temp (!) 97.2 F (36.2 C) (Oral)    Subjective:    Patient ID: Anthony Bell, male    DOB: [birth year], 65 y.o.   MRN: [id number]  HPI: Anthony Bell is a 65 y.o. male  Chief Complaint  Patient presents with  . Fatigue  . Covid Positive    Pt had a positive home COVID test on yesterday. Mild coughing, fatigue   This visit was completed via telephone due to the restrictions of the COVID-19 pandemic. All issues as above were discussed and addressed but no physical exam was performed. If it was felt that the patient should be evaluated in the office, they were directed there. The patient verbally consented to this visit. Patient was unable to complete an audio/visual visit due to Technical difficulties,Lack of internet. . Location of the patient: home . Location of the provider: home . Those involved with this call:  . Provider: [name], DNP  . Time spent on call: 5 minutes on the phone discussing health concerns. 5 minutes total spent in review of patient's record and preparation of their chart.  I verified patient identity using two factors (patient name and date of birth). Patient consents verbally to being seen via telemedicine visit today.  URI  This is a new problem. Episode onset: 4 days. The problem has been rapidly improving. There has been no fever. Associated symptoms include coughing. Pertinent negatives include no abdominal pain, chest pain, congestion, diarrhea, dysuria, ear pain, headaches, joint pain, joint swelling, nausea, neck pain, rash, rhinorrhea, sinus pain, sneezing, sore throat, swollen glands, vomiting or wheezing. He has tried nothing for the symptoms.     Relevant past medical, surgical, family and social history reviewed and updated as indicated. Interim medical history since our last visit reviewed. Allergies and medications reviewed and updated.  Review of Systems  HENT: Negative for congestion, ear pain, rhinorrhea, sinus pain, sneezing and  sore throat.   Respiratory: Positive for cough. Negative for wheezing.   Cardiovascular: Negative for chest pain.  Gastrointestinal: Negative for abdominal pain, diarrhea, nausea and vomiting.  Genitourinary: Negative for dysuria.  Musculoskeletal: Negative for joint pain and neck pain.  Skin: Negative for rash.  Neurological: Negative for headaches.    Per HPI unless specifically indicated above     Objective:    Temp (!) 97.2 F (36.2 C) (Oral)   Wt Readings from Last 3 Encounters:  03/22/20 194 lb (88 kg)  07/17/19 205 lb 6.4 oz (93.2 kg)  01/15/19 204 lb 3.2 oz (92.6 kg)    Physical Exam Neurological:     Mental Status: He is alert and oriented to person, place, and time.     Assessment & Plan:   Problem List Items Addressed This Visit   None   Visit Diagnoses    COVID-19    -  Primary   Pt doing well.  Can come offf quarantine after 5 days is asymptomatic.  High risk and incompletely vaccinated as no booster.  Rx for Molnupiravir.     Relevant Medications   molnupiravir EUA 200 mg CAPS       Follow up plan: Return if symptoms worsen or fail to improve.

## 2020-04-30 DIAGNOSIS — Z20822 Contact with and (suspected) exposure to covid-19: Secondary | ICD-10-CM | POA: Diagnosis not present

## 2020-05-04 ENCOUNTER — Telehealth: Payer: Self-pay

## 2020-05-04 ENCOUNTER — Ambulatory Visit: Payer: Self-pay | Admitting: Family Medicine

## 2020-05-04 NOTE — Telephone Encounter (Signed)
The pt was concern about the persistent fatigue after being diagnose with COVID x 10 days. He said he just don't have the energy to get up and go. I recommended that the patient continue to push fluid and rest. I also informed the patient that it can take awhile for his energy to return to normal. Appt offered and the patient declined.

## 2020-05-04 NOTE — Telephone Encounter (Signed)
Diagnosis of COVID- last Saturday- patient was tested. Patient was having fatigue and weakness as main symptoms. Fatigue and weakness have been main symptom. Patient reports he is eating and staying hydrated. Patient has no problem with cough, fever or breathing just extreme fatigue. Patient did have COVID vaccine- but no booster. Patient had to leave work early yesterday due to fatigue. Patient is at home today- patient is needing a note for work- he is unable to work a whole day without leaving. Told patient would send his request to office. Patient did have virtual visit for this 04/26/20.  Reason for Disposition . [1] Caller has NON-URGENT question AND [2] triager unable to answer  Protocols used: CORONAVIRUS (COVID-19) PERSISTING SYMPTOMS FOLLOW-UP CALL-A-AH

## 2020-05-04 NOTE — Telephone Encounter (Signed)
Copied from [phone]. Topic: Appointment Scheduling - Scheduling Inquiry for Clinic >> May 04, 2020  8:59 AM Oneta Rack wrote: Reason for [id number] virtual acute appt  Patient was dx with COVID patient experiencing dry mouth, fatigue, sore throat, no fever, no chest pain, no diarrhea for the last 10 days. Molnupiravir is not working

## 2020-05-04 NOTE — Telephone Encounter (Signed)
   Answer Assessment - Initial Assessment Questions 1. COVID-19 ONSET: "When did the symptoms of COVID-19 first start?"     *No Answer* 2. DIAGNOSIS CONFIRMATION: "How were you diagnosed?" (e.g., COVID-19 oral or nasal viral test; COVID-19 antibody test; doctor visit)     *No Answer* 3. MAIN SYMPTOM:  "What is your main concern or symptom right now?" (e.g., breathing difficulty, cough, fatigue. loss of smell)     *No Answer* 4. SYMPTOM ONSET: "When did the  *No Answer*  start?"     *No Answer* 5. BETTER-SAME-WORSE: "Are you getting better, staying the same, or getting worse over the last 1 to 2 weeks?"     *No Answer* 6. RECENT MEDICAL VISIT: "Have you been seen by a healthcare provider (doctor, NP, PA) for these persisting COVID-19 symptoms?" If Yes, ask: "When were you seen?" (e.g., date)     *No Answer* 7. COUGH: "Do you have a cough?" If Yes, ask: "How bad is the cough?"       *No Answer* 8. FEVER: "Do you have a fever?" If Yes, ask: "What is your temperature, how was it measured, and when did it start?"     *No Answer* 9. BREATHING DIFFICULTY: "Are you having any trouble breathing?" If Yes, ask: "How bad is your breathing?" (e.g., mild, moderate, severe)    - MILD: No SOB at rest, mild SOB with walking, speaks normally in sentences, can lie down, no retractions, pulse < 100.    - MODERATE: SOB at rest, SOB with minimal exertion and prefers to sit, cannot lie down flat, speaks in phrases, mild retractions, audible wheezing, pulse 100-120.    - SEVERE: Very SOB at rest, speaks in single words, struggling to breathe, sitting hunched forward, retractions, pulse > 120       *No Answer* 10. HIGH RISK DISEASE: "Do you have any chronic medical problems?" (e.g., asthma, heart or lung disease, weak immune system, obesity, etc.)       *No Answer* 11. VACCINE: "Have you gotten the COVID-19 vaccine?" If Yes ask: "Which one, how many shots, when did you get it?"       *No Answer* 12. PREGNANCY: "Is  there any chance you are pregnant?" "When was your last menstrual period?"       *No Answer* 13. OTHER SYMPTOMS: "Do you have any other symptoms?"  (e.g., fatigue, headache, muscle pain, weakness)       *No Answer*  Protocols used: CORONAVIRUS (COVID-19) PERSISTING SYMPTOMS FOLLOW-UP CALL-A-AH

## 2020-05-05 DIAGNOSIS — G933 Postviral fatigue syndrome: Secondary | ICD-10-CM | POA: Diagnosis not present

## 2020-05-05 DIAGNOSIS — Z8616 Personal history of COVID-19: Secondary | ICD-10-CM | POA: Diagnosis not present

## 2020-05-05 DIAGNOSIS — R5383 Other fatigue: Secondary | ICD-10-CM | POA: Diagnosis not present

## 2020-05-05 DIAGNOSIS — J189 Pneumonia, unspecified organism: Secondary | ICD-10-CM | POA: Diagnosis not present

## 2020-06-02 ENCOUNTER — Encounter: Payer: Self-pay | Admitting: Unknown Physician Specialty

## 2020-06-02 ENCOUNTER — Other Ambulatory Visit: Payer: Self-pay | Admitting: Unknown Physician Specialty

## 2020-06-02 ENCOUNTER — Ambulatory Visit (INDEPENDENT_AMBULATORY_CARE_PROVIDER_SITE_OTHER): Payer: BC Managed Care – PPO | Admitting: Unknown Physician Specialty

## 2020-06-02 ENCOUNTER — Other Ambulatory Visit: Payer: Self-pay

## 2020-06-02 VITALS — BP 112/62 | HR 72 | Temp 97.3°F | Ht 66.0 in | Wt 178.0 lb

## 2020-06-02 DIAGNOSIS — Z794 Long term (current) use of insulin: Secondary | ICD-10-CM

## 2020-06-02 DIAGNOSIS — E1165 Type 2 diabetes mellitus with hyperglycemia: Secondary | ICD-10-CM | POA: Diagnosis not present

## 2020-06-02 DIAGNOSIS — R350 Frequency of micturition: Secondary | ICD-10-CM

## 2020-06-02 LAB — POCT URINALYSIS DIPSTICK
Bilirubin, UA: NEGATIVE
Blood, UA: NEGATIVE
Glucose, UA: POSITIVE — AB
Leukocytes, UA: NEGATIVE
Nitrite, UA: NEGATIVE
Protein, UA: NEGATIVE
Spec Grav, UA: 1.015 (ref 1.010–1.025)
Urobilinogen, UA: NEGATIVE E.U./dL — AB
pH, UA: 5 (ref 5.0–8.0)

## 2020-06-02 LAB — POCT GLYCOSYLATED HEMOGLOBIN (HGB A1C): Hemoglobin A1C: 14 % — AB (ref 4.0–5.6)

## 2020-06-02 MED ORDER — METFORMIN HCL 1000 MG PO TABS: 1000.0000 mg | ORAL_TABLET | Freq: Two times a day (BID) | ORAL | 3 refills | Status: DC

## 2020-06-02 MED ORDER — INSULIN PEN NEEDLE 32G X 4 MM MISC: 1.0000 [IU] | Freq: Every morning | 3 refills | Status: DC

## 2020-06-02 MED ORDER — BLOOD GLUCOSE METER KIT: PACK | 0 refills | Status: DC

## 2020-06-02 MED ORDER — BLOOD GLUCOSE MONITOR KIT: PACK | 0 refills | Status: DC

## 2020-06-02 MED ORDER — LANTUS SOLOSTAR 100 UNIT/ML ~~LOC~~ SOPN: 20.0000 [IU] | PEN_INJECTOR | Freq: Every day | SUBCUTANEOUS | 99 refills | Status: DC

## 2020-06-02 NOTE — Progress Notes (Signed)
BP 112/62 (BP Location: Left Arm, Patient Position: Sitting, Cuff Size: Normal)   Pulse 72   Temp (!) 97.3 F (36.3 C) (Temporal)   Ht 5\' 6"  (1.676 m)   Wt 178 lb (80.7 kg)   SpO2 99%   BMI 28.73 kg/m    Subjective:    Patient ID: Montez Morita, male    DOB: July 18, 1955, 65 y.o.   MRN: [id number]  HPI: Onix Jumper is a 65 y.o. male  Chief Complaint  Patient presents with  . Fatigue    Pt tested Positive for COVID on January 28th and the fatigue continues that he can not even work his normal full shift like he use to. He lost his sense of taste during Covid and until this day it has not came back. Pt mention about committing suicide like 3 times during [place].  Marland Kitchen Urinary Frequency    It started about 3 weeks ago and it is every 15 minutes even during night times.  . Weight Loss    He was 209 lb right before COVID and he did loss appetite, which is slowly coming back.    Pt is here with his wife who gives some of the history,  presenting primarily with fatigue.    Depression screen [facility] 2/9 06/02/2020 01/15/2019 04/23/2017  Decreased Interest 1 0 0  Down, Depressed, Hopeless 1 0 0  PHQ - 2 Score 2 0 0  Altered sleeping 2 0 -  Tired, decreased energy - 0 -  Change in appetite 3 0 -  Feeling bad or failure about yourself  3 0 -  Trouble concentrating 0 0 -  Moving slowly or fidgety/restless 0 0 -  Suicidal thoughts 0 0 -  PHQ-9 Score 10 0 -  Difficult doing work/chores Not difficult at all - -   GAD 7 : Generalized Anxiety Score 06/02/2020  Nervous, Anxious, on Edge 0  Control/stop worrying 0  Worry too much - different things 0  Trouble relaxing 0  Restless 0  Easily annoyed or irritable 0  Afraid - awful might happen 0  Total GAD 7 Score 0  Anxiety Difficulty Not difficult at all    Depression when he had Covid.  For Covid, received Molnupiravir and the only symptom he hasn't been able to shake is the fatigue.  Depression no longer a problem and no suicidal  ideation.    Diabetes: Using medications without difficulties Not checking blood sugar at home eye exam within last year: None Last Hgb A1C: 02/2021 which was 7.7%   Relevant past medical, surgical, family and social history reviewed and updated as indicated. Interim medical history since our last visit reviewed. Allergies and medications reviewed and updated.  Review of Systems  Constitutional: Positive for appetite change, fatigue and unexpected weight change. Negative for fever.  HENT:       Taste changes  Respiratory: Negative.   Cardiovascular: Negative.  Negative for leg swelling.  Gastrointestinal: Negative.   Endocrine: Positive for polydipsia and polyuria.  Musculoskeletal: Negative.   Skin: Negative.   Neurological: Negative.     Per HPI unless specifically indicated above     Objective:    BP 112/62 (BP Location: Left Arm, Patient Position: Sitting, Cuff Size: Normal)   Pulse 72   Temp (!) 97.3 F (36.3 C) (Temporal)   Ht 5\' 6"  (1.676 m)   Wt 178 lb (80.7 kg)   SpO2 99%   BMI 28.73 kg/m   Wt Readings from Last 3  Encounters:  06/02/20 178 lb (80.7 kg)  03/22/20 194 lb (88 kg)  07/17/19 205 lb 6.4 oz (93.2 kg)    Physical Exam Constitutional:      General: He is not in acute distress.    Appearance: Normal appearance. He is well-developed.  HENT:     Head: Normocephalic and atraumatic.  Eyes:     General: Lids are normal. No scleral icterus.       Right eye: No discharge.        Left eye: No discharge.     Conjunctiva/sclera: Conjunctivae normal.  Neck:     Vascular: No carotid bruit or JVD.  Cardiovascular:     Rate and Rhythm: Normal rate and regular rhythm.     Heart sounds: Normal heart sounds.  Pulmonary:     Effort: Pulmonary effort is normal. No respiratory distress.     Breath sounds: Normal breath sounds.  Abdominal:     Palpations: There is no hepatomegaly or splenomegaly.  Musculoskeletal:        General: Normal range of motion.      Cervical back: Normal range of motion and neck supple.  Skin:    General: Skin is warm and dry.     Coloration: Skin is not pale.     Findings: No rash.  Neurological:     Mental Status: He is alert and oriented to person, place, and time.  Psychiatric:        Behavior: Behavior normal.        Thought Content: Thought content normal.        Judgment: Judgment normal.      Assessment & Plan:   Problem List Items Addressed This Visit      Unprioritized   Type 2 diabetes mellitus ([place]) - Primary    Very poor control with Hgb A1C of 14 with polydipsia and polyuria.  Ketones in urine but no nausea and vomiting.  Started on a sample of Basaglar with 10 units of insulin and the following titration instructions: Base your long acting insulin on your fasting (usually in the morning) blood sugar.  Increase long acting (daily insulin) 2 units if fasting blood sugar is greater than 120.  Decrease by 2 units if fasting blood sugar is less than 95.  Refer to chronic care management for nursing and pharmacy         Relevant Medications   insulin glargine (LANTUS SOLOSTAR) 100 UNIT/ML Solostar Pen   metFORMIN (GLUCOPHAGE) 1000 MG tablet   Other Relevant Orders   POCT HgB A1C (Completed)   AMB Referral to Community Care Coordinaton   Comprehensive metabolic panel   CBC with Differential/Platelet    Other Visit Diagnoses    Urinary frequency       Relevant Orders   POCT Urinalysis Dipstick (Completed)   Comprehensive metabolic panel   CBC with Differential/Platelet        Follow up plan: Return in about 5 days (around 06/07/2020).

## 2020-06-02 NOTE — Assessment & Plan Note (Addendum)
Very poor control with Hgb A1C of 14 with polydipsia and polyuria.  Ketones in urine but no nausea and vomiting.  Started on a sample of Basaglar with 10 units of insulin and the following titration instructions: Base your long acting insulin on your fasting (usually in the morning) blood sugar.  Increase long acting (daily insulin) 2 units if fasting blood sugar is greater than 120.  Decrease by 2 units if fasting blood sugar is less than 95.  Refer to chronic care management for nursing and pharmacy

## 2020-06-02 NOTE — Telephone Encounter (Signed)
Requested medication (s) are due for refill today:   Prescribed by [name] this morning 3/10  Requested medication (s) are on the active medication list:   Yes  Future visit scheduled:   Seen this morning   Last ordered: See pharmacy request.  They are needing clarification.   Thanks.   Requested Prescriptions  Pending Prescriptions Disp Refills   Blood Glucose Monitoring Suppl (CONTOUR NEXT EZ) w/Device KIT [Pharmacy Med Name: CONTOUR NEXT EZ METER]  0    Sig: DISPENSE BASED ON PATIENT AND INSURANCE PREFERENCE. USE UP TO FOUR TIMES DAILY AS DIRECTED. (FOR ICD-10 E10.9, E11.9).      Endocrinology: Diabetes - Testing Supplies Passed - 06/02/2020 10:12 AM      Passed - Valid encounter within last 12 months    Recent Outpatient Visits           Today Type 2 diabetes mellitus with hyperglycemia, without long-term current use of insulin [facility])   [facility] [name], NP   1 month ago COVID-19   [facility], [place], NP   2 months ago Type 2 diabetes mellitus with hyperglycemia, without long-term current use of insulin [facility])   [facility], Lupita Raider, FNP   10 months ago Type 2 diabetes mellitus with hyperglycemia, without long-term current use of insulin [facility])   [facility], Lupita Raider, FNP   1 year ago Type 2 diabetes mellitus with hyperglycemia, without long-term current use of insulin [facility])   [facility] Merrilyn Puma, Jerrel Ivory, NP       Future Appointments             In 5 days [name], NP [facility], [facility]

## 2020-06-02 NOTE — Patient Instructions (Signed)
Base your long acting insulin on your fasting (usually in the morning) blood sugar.  Increase long acting (daily insulin) 2 units if fasting blood sugar is greater than 120.  Decrease by 2 units if fasting blood sugar is less than 95.    

## 2020-06-03 ENCOUNTER — Telehealth: Payer: Self-pay | Admitting: *Deleted

## 2020-06-03 LAB — CBC WITH DIFFERENTIAL/PLATELET
Absolute Monocytes: 616 cells/uL (ref 200–950)
Basophils Absolute: 31 cells/uL (ref 0–200)
Basophils Relative: 0.4 %
Eosinophils Absolute: 193 cells/uL (ref 15–500)
Eosinophils Relative: 2.5 %
HCT: 50.3 % — ABNORMAL HIGH (ref 38.5–50.0)
Hemoglobin: 16.9 g/dL (ref 13.2–17.1)
Lymphs Abs: 1448 cells/uL (ref 850–3900)
MCH: 26.7 pg — ABNORMAL LOW (ref 27.0–33.0)
MCHC: 33.6 g/dL (ref 32.0–36.0)
MCV: 79.6 fL — ABNORMAL LOW (ref 80.0–100.0)
MPV: 11.3 fL (ref 7.5–12.5)
Monocytes Relative: 8 %
Neutro Abs: 5413 cells/uL (ref 1500–7800)
Neutrophils Relative %: 70.3 %
Platelets: 222 10*3/uL (ref 140–400)
RBC: 6.32 10*6/uL — ABNORMAL HIGH (ref 4.20–5.80)
RDW: 14.1 % (ref 11.0–15.0)
Total Lymphocyte: 18.8 %
WBC: 7.7 10*3/uL (ref 3.8–10.8)

## 2020-06-03 LAB — COMPREHENSIVE METABOLIC PANEL
AG Ratio: 1.4 (calc) (ref 1.0–2.5)
ALT: 19 U/L (ref 9–46)
AST: 14 U/L (ref 10–35)
Albumin: 4 g/dL (ref 3.6–5.1)
Alkaline phosphatase (APISO): 120 U/L (ref 35–144)
BUN: 22 mg/dL (ref 7–25)
CO2: 28 mmol/L (ref 20–32)
Calcium: 9.7 mg/dL (ref 8.6–10.3)
Chloride: 94 mmol/L — ABNORMAL LOW (ref 98–110)
Creat: 1.21 mg/dL (ref 0.70–1.25)
Globulin: 2.8 g/dL (calc) (ref 1.9–3.7)
Glucose, Bld: 288 mg/dL — ABNORMAL HIGH (ref 65–99)
Potassium: 4.2 mmol/L (ref 3.5–5.3)
Sodium: 136 mmol/L (ref 135–146)
Total Bilirubin: 0.4 mg/dL (ref 0.2–1.2)
Total Protein: 6.8 g/dL (ref 6.1–8.1)

## 2020-06-03 NOTE — Chronic Care Management (AMB) (Signed)
  Care Management   Outreach Note  06/03/2020 Name: [name] MRN: [id number] DOB: [birth year]  Anthony Bell is a 65 y.o. year old male who is a primary care patient of Wicker,Cheryl, NP. I reached out to Montez Morita by phone today in response to a referral sent by Mr. Keath Justiniano's PCP, Wicker,Cheryl,NP     An unsuccessful telephone outreach was attempted today. The patient was referred to the case management team for assistance with care management and care coordination.   Follow Up Plan: A HIPAA compliant phone message was left for the patient providing contact information and requesting a return call.  The care management team will reach out to the patient again over the next 3 days.  If patient returns call to provider office, please advise to call [place] Lysle Morales at [facility]

## 2020-06-06 ENCOUNTER — Ambulatory Visit: Payer: Self-pay | Admitting: General Practice

## 2020-06-06 ENCOUNTER — Other Ambulatory Visit: Payer: Self-pay | Admitting: Family Medicine

## 2020-06-06 ENCOUNTER — Ambulatory Visit: Payer: BC Managed Care – PPO | Admitting: Pharmacist

## 2020-06-06 DIAGNOSIS — E1165 Type 2 diabetes mellitus with hyperglycemia: Secondary | ICD-10-CM

## 2020-06-06 DIAGNOSIS — I10 Essential (primary) hypertension: Secondary | ICD-10-CM

## 2020-06-06 DIAGNOSIS — E785 Hyperlipidemia, unspecified: Secondary | ICD-10-CM

## 2020-06-06 MED ORDER — CONTOUR NEXT EZ W/DEVICE KIT: PACK | 0 refills | Status: DC

## 2020-06-06 NOTE — Chronic Care Management (AMB) (Signed)
  Care Management   Note  06/06/2020 Name: Easter Schinke MRN: [id number] DOB: [birth year]  Jonmarc Bodkin is a 65 y.o. year old male who is a primary care patient of Lorine Bears, Lupita Raider, FNP and is actively engaged with the care management team. I reached out to Montez Morita by phone today to assist with scheduling an initial visit with the Pharmacist  Follow up plan: Telephone appointment with care management team member scheduled for: 06/15/2020  [facility]

## 2020-06-06 NOTE — Chronic Care Management (AMB) (Signed)
Care Management    RN Visit Note  06/06/2020 Name: Anthony Bell MRN: [id number] DOB: May 02, 1955  Subjective: Anthony Bell is a 65 y.o. year old male who is a primary care patient of Lorine Bears, Lupita Raider, FNP. The care management team was consulted for assistance with disease management and care coordination needs.    Engaged with patient by telephone for initial visit in response to provider referral for case management and/or care coordination services.   Consent to Services:   Anthony Bell was given information about Care Management services today including:  1. Care Management services includes personalized support from designated clinical staff supervised by his physician, including individualized plan of care and coordination with other care providers 2. 24/7 contact phone numbers for assistance for urgent and routine care needs. 3. The patient may stop case management services at any time by phone call to the office staff.  Patient agreed to services and consent obtained.   Assessment: Review of patient past medical history, allergies, medications, health status, including review of consultants reports, laboratory and other test data, was performed as part of comprehensive evaluation and provision of chronic care management services.   SDOH (Social Determinants of Health) assessments and interventions performed:  SDOH Interventions   Flowsheet Row Most Recent Value  SDOH Interventions   Depression Interventions/Treatment  --  [follows up with pcp on 06-07-2020]       Care Plan  No Known Allergies  Outpatient Encounter Medications as of 06/06/2020  Medication Sig  . amLODipine (NORVASC) 5 MG tablet Take 1 tablet (5 mg total) by mouth daily.  Marland Kitchen aspirin EC 81 MG tablet Take 81 mg by mouth.   Marland Kitchen atorvastatin (LIPITOR) 20 MG tablet Take 1 tablet (20 mg total) by mouth daily.  . blood glucose meter kit and supplies KIT Dispense based on patient and insurance preference. Use up to four  times daily as directed. 100 strips and lancets please  . Blood Glucose Monitoring Suppl (CONTOUR NEXT EZ) w/Device KIT DISPENSE BASED ON PATIENT AND INSURANCE PREFERENCE. USE UP TO FOUR TIMES DAILY AS DIRECTED. (FOR ICD-10 E10.9, E11.9).  Marland Kitchen empagliflozin (JARDIANCE) 10 MG TABS tablet Take 1 tablet (10 mg total) by mouth daily before breakfast.  . hydrochlorothiazide (HYDRODIURIL) 25 MG tablet Take 1 tablet (25 mg total) by mouth daily.  . insulin glargine (LANTUS SOLOSTAR) 100 UNIT/ML Solostar Pen Inject 20 Units into the skin daily.  . Insulin Pen Needle 32G X 4 MM MISC 1 Units by Does not apply route every morning. Pen needles  . lisinopril (ZESTRIL) 20 MG tablet Take 1 tablet (20 mg total) by mouth daily.  . metFORMIN (GLUCOPHAGE) 1000 MG tablet Take 1 tablet (1,000 mg total) by mouth 2 (two) times daily with a meal.  . tadalafil (CIALIS) 5 MG tablet Take 1 tablet (5 mg total) by mouth daily as needed for erectile dysfunction.   No facility-administered encounter medications on file as of 06/06/2020.    Patient Active Problem List   Diagnosis Date Noted  . Hyperlipidemia 03/23/2020  . Erectile dysfunction 03/22/2020  . Toenail fungus 07/17/2019  . Type 2 diabetes mellitus ([place]) 08/23/2017  . Gastroesophageal reflux disease 12/07/2014  . Essential hypertension 12/07/2014  . Rectal cancer ([place]) 12/07/2014  . Tobacco abuse 12/07/2014  . Tubular adenoma polyp of rectum 11/08/2014    Conditions to be addressed/monitored: HTN, HLD and DMII  Care Plan : RNCM: Diabetes Type 2 (Adult)  Updates made by Vanita Ingles since 06/06/2020 12:00  AM    Problem: RNCM: Glycemic Management (Diabetes, Type 2)   Priority: Medium    Long-Range Goal: RNCM: Glycemic Management Optimized   Priority: High  Note:   Objective:  Lab Results  Component Value Date   HGBA1C 14.0 (A) 06/02/2020 .   Lab Results  Component Value Date   CREATININE 1.21 06/02/2020   CREATININE 1.47 (H) 03/22/2020    CREATININE 1.55 (H) 02/02/2018 .   Marland Kitchen No results found for: EGFR Current Barriers:  Marland Kitchen Knowledge Deficits related to basic Diabetes pathophysiology and self care/management . Knowledge Deficits related to medications used for management of diabetes . Does not have glucometer to monitor blood sugar- meter ordered on 06-02-2020 . Does not use cbg meter  . Limited Social Support . Unable to independently manage DM . Lacks social connections . Does not maintain contact with provider office . Does not contact provider office for questions/concerns Case Manager Clinical Goal(s):  . patient will demonstrate improved adherence to prescribed treatment plan for diabetes self care/management as evidenced by: daily monitoring and recording of CBG  adherence to ADA/ carb modified diet adherence to prescribed medication regimen contacting provider for new or worsened symptoms or questions Interventions:  . Collaboration with Malfi, Lupita Raider, FNP regarding development and update of comprehensive plan of care as evidenced by provider attestation and co-signature . Inter-disciplinary care team collaboration (see longitudinal plan of care) . Provided education to patient about basic DM disease process. Education on checking blood sugars. The patient states his sister is supposed to bring him a meter. Co-collaboration with the pharmacist. Meter was ordered on 06-02-2020 by pcp. Pharm D will assist with meter use and medications management. Spoke with the patient about the importance of checking blood sugars and fasting blood sugar <130 and post prandial <180.  Also discussed goal of hemoglobin A1C of 7.0 or less. The patient verbalized understanding. The patient drinks Pepsi but has switched to water. Talked about no sugar options for drinks. The patient does not like diet pepsi. Offered other options for the patient to try. Expressed the benefits of controlled DM on organ systems. The patient is receptive to learning.   . Reviewed medications with patient and discussed importance of medication adherence . Discussed plans with patient for ongoing care management follow up and provided patient with direct contact information for care management team . Provided patient with written educational materials related to hypo and hyperglycemia and importance of correct treatment. Reviewed with the patient the sx and sx and what to look for in changes.  . Reviewed scheduled/upcoming provider appointments including: 06-07-2020 . Advised patient, providing education and rationale, to check cbg BID and record, calling pcp for findings outside established parameters.   . Referral made to pharmacy team for assistance with medication education, management, and help with the importance of taking blood sugars daily and managing blood sugars with medications and dietary constraints  . Review of patient status, including review of consultants reports, relevant laboratory and other test results, and medications completed. Self-Care Activities - UNABLE to independently manage DM as evidence of hemoglobin A1C of 14 Self administers oral medications as prescribed Attends all scheduled provider appointments Checks blood sugars as prescribed and utilize hyper and hypoglycemia protocol as needed Adheres to prescribed ADA/carb modified Patient Goals: - check blood sugar at prescribed times - check blood sugar before and after exercise - check blood sugar if I feel it is too high or too low - enter blood sugar readings and medication  or insulin into daily log - take the blood sugar log to all doctor visits - take the blood sugar meter to all doctor visits - change to whole grain breads, cereal, pasta - drink 6 to 8 glasses of water each day - fill half of plate with vegetables - limit fast food meals to no more than 1 per week - manage portion size - prepare main meal at home 3 to 5 days each week - read food labels for fat, fiber,  carbohydrates and portion size - switch to low-fat or skim milk - switch to sugar-free drinks - schedule appointment with eye doctor - check feet daily for cuts, sores or redness - do heel pump exercise 2 to 3 times each day - keep feet up while sitting - trim toenails straight across - wash and dry feet carefully every day - wear comfortable, cotton socks - wear comfortable, well-fitting shoes barriers to adherence to treatment plan identified - blood glucose monitoring encouraged - blood glucose readings reviewed - counseling by pharmacist provided- pharm D-co collaboration. Pharm D will contact the patient for help with effective management of DM and other chronic conditions  - individualized medical nutrition therapy provided - mutual A1C goal set or reviewed - resources required to improve adherence to care identified - self-awareness of signs/symptoms of hypo or hyperglycemia encouraged - use of blood glucose monitoring log promoted- to send log by mail to the patient.    Follow Up Plan: Telephone follow up appointment with care management team member scheduled for: 06-30-2020 at 53 am   Task: RNCM: Alleviate Barriers to Glycemic Management   Note:   Care Management Activities:    - barriers to adherence to treatment plan identified - blood glucose monitoring encouraged - blood glucose readings reviewed - counseling by pharmacist provided- pharm D-co collaboration. Pharm D will contact the patient for help with effective management of DM and other chronic conditions  - individualized medical nutrition therapy provided - mutual A1C goal set or reviewed - resources required to improve adherence to care identified - self-awareness of signs/symptoms of hypo or hyperglycemia encouraged - use of blood glucose monitoring log promoted- to send log by mail to the patient.       Care Plan : RNCM: Hypertension (Adult)  Updates made by Vanita Ingles since 06/06/2020 12:00 AM     Problem: RNCM: Hypertension (Hypertension)   Priority: Medium    Long-Range Goal: RNCM: Hypertension Monitored   Priority: Medium  Note:   Objective:  . Last practice recorded BP readings:  BP Readings from Last 3 Encounters:  06/02/20 112/62  03/22/20 (!) 183/61  07/17/19 (!) 186/67 .   Marland Kitchen Most recent eGFR/CrCl: No results found for: EGFR  No components found for: CRCL Current Barriers:  Marland Kitchen Knowledge Deficits related to basic understanding of hypertension pathophysiology and self care management . Knowledge Deficits related to understanding of medications prescribed for management of hypertension . Limited Social Support . Lacks social connections . Does not maintain contact with provider office . Does not contact provider office for questions/concerns Case Manager Clinical Goal(s):  Marland Kitchen Over the next 120 days, patient will verbalize understanding of plan for hypertension management . Over the next 120 days, patient will attend all scheduled medical appointments: 06-07-2020 . Over the next 120 days, patient will demonstrate improved adherence to prescribed treatment plan for hypertension as evidenced by taking all medications as prescribed, monitoring and recording blood pressure as directed, adhering to low sodium/DASH diet .  Over the next 120 days, patient will demonstrate improved health management independence as evidenced by checking blood pressure as directed and notifying PCP if SBP>160 or DBP > 90, taking all medications as prescribe, and adhering to a low sodium diet as discussed. . Over the next 120 days, patient will verbalize basic understanding of hypertension disease process and self health management plan as evidenced by compliance with medications, compliance with heart healthy/ADA diet and working with CCM team to optimize health and well being  Interventions:  . Collaboration with Malfi, Lupita Raider, FNP regarding development and update of comprehensive plan of care as  evidenced by provider attestation and co-signature . Inter-disciplinary care team collaboration (see longitudinal plan of care) . Evaluation of current treatment plan related to hypertension self management and patient's adherence to plan as established by provider. . Provided education to patient re: stroke prevention, s/s of heart attack and stroke, DASH diet, complications of uncontrolled blood pressure . Reviewed medications with patient and discussed importance of compliance . Discussed plans with patient for ongoing care management follow up and provided patient with direct contact information for care management team . Advised patient, providing education and rationale, to monitor blood pressure daily and record, calling PCP for findings outside established parameters.  . Reviewed scheduled/upcoming provider appointments including: 06-07-2020 Self-Care Activities: - Self administers medications as prescribed Attends all scheduled provider appointments Calls provider office for new concerns, questions, or BP outside discussed parameters Checks BP and records as discussed Follows a low sodium diet/DASH diet Patient Goals: - check blood pressure weekly - choose a place to take my blood pressure (home, clinic or office, retail store) - write blood pressure results in a log or diary - agree on reward when goals are met - agree to work together to make changes - ask questions to understand - have a family meeting to talk about healthy habits - learn about high blood pressure - blood pressure trends reviewed - depression screen reviewed - home or ambulatory blood pressure monitoring encouraged Follow Up Plan: Telephone follow up appointment with care management team member scheduled for: 06-30-2020 at 1030 am   Task: RNCM: Identify and Monitor Blood Pressure Elevation   Note:   Care Management Activities:    - blood pressure trends reviewed - depression screen reviewed - home or  ambulatory blood pressure monitoring encouraged       Care Plan : RNCM: HLD Management  Updates made by Vanita Ingles since 06/06/2020 12:00 AM    Problem: RNCM: Management of HLD   Priority: Medium    Long-Range Goal: RNCM: Management of HLD   Priority: Medium  Note:   Current Barriers:  . Poorly controlled hyperlipidemia, complicated by uncontrolled DM . Current antihyperlipidemic regimen: Lipitor 20 Mg QD . Most recent lipid panel:     Component Value Date/Time   CHOL 235 (H) 03/22/2020 0833   TRIG 242 (H) 03/22/2020 0833   HDL 36 (L) 03/22/2020 0833   CHOLHDL 6.5 (H) 03/22/2020 0833   LDLCALC 158 (H) 03/22/2020 9563 .   Marland Kitchen ASCVD risk enhancing conditions: age 26,  DM, HTN, current smoker . Unable to independently manage chronic conditons  . Lacks social connections . Does not maintain contact with provider office . Does not contact provider office for questions/concerns RN Care Manager Clinical Goal(s):  . patient will work with Consulting civil engineer, providers, and care team towards execution of optimized self-health management plan . patient will verbalize understanding of plan for effective management  of HLD  . patient will work with [facility], CCM team  to address needs related to HLD  . patient will attend all scheduled medical appointments: 06-07-2020 Interventions: . Collaboration with Malfi, Lupita Raider, FNP regarding development and update of comprehensive plan of care as evidenced by provider attestation and co-signature . Inter-disciplinary care team collaboration (see longitudinal plan of care) . Medication review performed; medication list updated in electronic medical record.  Bertram Savin care team collaboration (see longitudinal plan of care) . Referred to pharmacy team for assistance with HLD medication management . Evaluation of current treatment plan related to HLD  and patient's adherence to plan as established by provider. . Advised patient to call the  office for changes or questions . Provided education to patient re: heart healthy/ADA diet  . Reviewed medications with patient and discussed compliance/cost constraints/questions . Provided patient with dietary restriction  educational materials related to Heart healthy/ADA diet and the benefits of controlling chronic conditions to promote health and well being.  . Reviewed scheduled/upcoming provider appointments including: 06-07-2020  . Discussed plans with patient for ongoing care management follow up and provided patient with direct contact information for care management team Patient Goals/Self-Care Activities: - call for medicine refill 2 or 3 days before it runs out - call if I am sick and can't take my medicine - keep a list of all the medicines I take; vitamins and herbals too - learn to read medicine labels - use a pillbox to sort medicine - use an alarm clock or phone to remind me to take my medicine - change to whole grain breads, cereal, pasta - drink 6 to 8 glasses of water each day - eat 3 to 5 servings of fruits and vegetables each day - eat 5 or 6 small meals each day - fill half the plate with nonstarchy vegetables - limit fast food meals to no more than 1 per week - manage portion size - prepare main meal at home 3 to 5 days each week - read food labels for fat, fiber, carbohydrates and portion size - switch to low-fat or skim milk - switch to sugar-free drinks - be open to making changes - I can manage, know and watch for signs of a heart attack - if I have chest pain, call for help - learn about small changes that will make a big difference - learn my personal risk factors  Follow Up Plan: Telephone follow up appointment with care management team member scheduled for: 06-30-2020 at 49 am       Plan: Telephone follow up appointment with care management team member scheduled for:  06-30-2020 at 80 am  Noreene Larsson RN, MSN, [place] Mobile: [phone]

## 2020-06-06 NOTE — Patient Instructions (Addendum)
Visit Information  PATIENT GOALS: Goals Addressed            This Visit's Progress   . Pharmacy Goals       Our goal A1c is less than 7%. This corresponds with fasting sugars less than 130 and 2 hour after meal sugars less than 180. Please check your blood sugar and keep a log of the results  Our goal bad cholesterol, or LDL, is less than 70 . This is why it is important to continue taking your atorvastatin   Feel free to call me with any questions or concerns. I look forward to our next call!  Harlow Asa, PharmD, [place] [phone]       The patient verbalized understanding of instructions, educational materials, and care plan provided today and declined offer to receive copy of patient instructions, educational materials, and care plan.   Telephone follow up appointment with care management team member scheduled for: 3/23 at 3:30 pm  Harlow Asa, PharmD, [place] [phone]

## 2020-06-06 NOTE — Patient Instructions (Signed)
Visit Information  PATIENT GOALS:  Goals Addressed            This Visit's Progress   . RNCM: Monitor and Manage My Blood Sugar-Diabetes Type 2       Timeframe:  Short-Term Goal Priority:  High Start Date:       06-06-2020                      Expected End Date:      08-22-2020                 Follow Up Date 06-30-2020   - check blood sugar at prescribed times - check blood sugar before and after exercise - check blood sugar if I feel it is too high or too low - enter blood sugar readings and medication or insulin into daily log - take the blood sugar log to all doctor visits - take the blood sugar meter to all doctor visits    Why is this important?    Checking your blood sugar at home helps to keep it from getting very high or very low.   Writing the results in a diary or log helps the doctor know how to care for you.   Your blood sugar log should have the time, date and the results.   Also, write down the amount of insulin or other medicine that you take.   Other information, like what you ate, exercise done and how you were feeling, will also be helpful.     Notes: Encouraged the patient to start taking blood sugars regularly      Patient Care Plan: RNCM: Diabetes Type 2 (Adult)    Problem Identified: RNCM: Glycemic Management (Diabetes, Type 2)   Priority: Medium    Long-Range Goal: RNCM: Glycemic Management Optimized   Priority: High  Note:   Objective:  Lab Results  Component Value Date   HGBA1C 14.0 (A) 06/02/2020 .   Lab Results  Component Value Date   CREATININE 1.21 06/02/2020   CREATININE 1.47 (H) 03/22/2020   CREATININE 1.55 (H) 02/02/2018 .   Marland Kitchen No results found for: EGFR Current Barriers:  Marland Kitchen Knowledge Deficits related to basic Diabetes pathophysiology and self care/management . Knowledge Deficits related to medications used for management of diabetes . Does not have glucometer to monitor blood sugar- meter ordered on 06-02-2020 . Does not use  cbg meter  . Limited Social Support . Unable to independently manage DM . Lacks social connections . Does not maintain contact with provider office . Does not contact provider office for questions/concerns Case Manager Clinical Goal(s):  . patient will demonstrate improved adherence to prescribed treatment plan for diabetes self care/management as evidenced by: daily monitoring and recording of CBG  adherence to ADA/ carb modified diet adherence to prescribed medication regimen contacting provider for new or worsened symptoms or questions Interventions:  . Collaboration with Malfi, Lupita Raider, FNP regarding development and update of comprehensive plan of care as evidenced by provider attestation and co-signature . Inter-disciplinary care team collaboration (see longitudinal plan of care) . Provided education to patient about basic DM disease process. Education on checking blood sugars. The patient states his sister is supposed to bring him a meter. Co-collaboration with the pharmacist. Meter was ordered on 06-02-2020 by pcp. Pharm D will assist with meter use and medications management. Spoke with the patient about the importance of checking blood sugars and fasting blood sugar <130 and post prandial <180.  Also discussed goal of hemoglobin A1C of 7.0 or less. The patient verbalized understanding. The patient drinks Pepsi but has switched to water. Talked about no sugar options for drinks. The patient does not like diet pepsi. Offered other options for the patient to try. Expressed the benefits of controlled DM on organ systems. The patient is receptive to learning.  . Reviewed medications with patient and discussed importance of medication adherence . Discussed plans with patient for ongoing care management follow up and provided patient with direct contact information for care management team . Provided patient with written educational materials related to hypo and hyperglycemia and importance of  correct treatment. Reviewed with the patient the sx and sx and what to look for in changes.  . Reviewed scheduled/upcoming provider appointments including: 06-07-2020 . Advised patient, providing education and rationale, to check cbg BID and record, calling pcp for findings outside established parameters.   . Referral made to pharmacy team for assistance with medication education, management, and help with the importance of taking blood sugars daily and managing blood sugars with medications and dietary constraints  . Review of patient status, including review of consultants reports, relevant laboratory and other test results, and medications completed. Self-Care Activities - UNABLE to independently manage DM as evidence of hemoglobin A1C of 14 Self administers oral medications as prescribed Attends all scheduled provider appointments Checks blood sugars as prescribed and utilize hyper and hypoglycemia protocol as needed Adheres to prescribed ADA/carb modified Patient Goals: - check blood sugar at prescribed times - check blood sugar before and after exercise - check blood sugar if I feel it is too high or too low - enter blood sugar readings and medication or insulin into daily log - take the blood sugar log to all doctor visits - take the blood sugar meter to all doctor visits - change to whole grain breads, cereal, pasta - drink 6 to 8 glasses of water each day - fill half of plate with vegetables - limit fast food meals to no more than 1 per week - manage portion size - prepare main meal at home 3 to 5 days each week - read food labels for fat, fiber, carbohydrates and portion size - switch to low-fat or skim milk - switch to sugar-free drinks - schedule appointment with eye doctor - check feet daily for cuts, sores or redness - do heel pump exercise 2 to 3 times each day - keep feet up while sitting - trim toenails straight across - wash and dry feet carefully every day - wear  comfortable, cotton socks - wear comfortable, well-fitting shoes barriers to adherence to treatment plan identified - blood glucose monitoring encouraged - blood glucose readings reviewed - counseling by pharmacist provided- pharm D-co collaboration. Pharm D will contact the patient for help with effective management of DM and other chronic conditions  - individualized medical nutrition therapy provided - mutual A1C goal set or reviewed - resources required to improve adherence to care identified - self-awareness of signs/symptoms of hypo or hyperglycemia encouraged - use of blood glucose monitoring log promoted- to send log by mail to the patient.    Follow Up Plan: Telephone follow up appointment with care management team member scheduled for: 06-30-2020 at 31 am   Task: RNCM: Alleviate Barriers to Glycemic Management   Note:   Care Management Activities:    - barriers to adherence to treatment plan identified - blood glucose monitoring encouraged - blood glucose readings reviewed - counseling by pharmacist  provided- pharm D-co collaboration. Pharm D will contact the patient for help with effective management of DM and other chronic conditions  - individualized medical nutrition therapy provided - mutual A1C goal set or reviewed - resources required to improve adherence to care identified - self-awareness of signs/symptoms of hypo or hyperglycemia encouraged - use of blood glucose monitoring log promoted- to send log by mail to the patient.       Patient Care Plan: RNCM: Hypertension (Adult)    Problem Identified: RNCM: Hypertension (Hypertension)   Priority: Medium    Long-Range Goal: RNCM: Hypertension Monitored   Priority: Medium  Note:   Objective:  . Last practice recorded BP readings:  BP Readings from Last 3 Encounters:  06/02/20 112/62  03/22/20 (!) 183/61  07/17/19 (!) 186/67 .   Marland Kitchen Most recent eGFR/CrCl: No results found for: EGFR  No components found for:  CRCL Current Barriers:  Marland Kitchen Knowledge Deficits related to basic understanding of hypertension pathophysiology and self care management . Knowledge Deficits related to understanding of medications prescribed for management of hypertension . Limited Social Support . Lacks social connections . Does not maintain contact with provider office . Does not contact provider office for questions/concerns Case Manager Clinical Goal(s):  Marland Kitchen Over the next 120 days, patient will verbalize understanding of plan for hypertension management . Over the next 120 days, patient will attend all scheduled medical appointments: 06-07-2020 . Over the next 120 days, patient will demonstrate improved adherence to prescribed treatment plan for hypertension as evidenced by taking all medications as prescribed, monitoring and recording blood pressure as directed, adhering to low sodium/DASH diet . Over the next 120 days, patient will demonstrate improved health management independence as evidenced by checking blood pressure as directed and notifying PCP if SBP>160 or DBP > 90, taking all medications as prescribe, and adhering to a low sodium diet as discussed. . Over the next 120 days, patient will verbalize basic understanding of hypertension disease process and self health management plan as evidenced by compliance with medications, compliance with heart healthy/ADA diet and working with CCM team to optimize health and well being  Interventions:  . Collaboration with Malfi, Lupita Raider, FNP regarding development and update of comprehensive plan of care as evidenced by provider attestation and co-signature . Inter-disciplinary care team collaboration (see longitudinal plan of care) . Evaluation of current treatment plan related to hypertension self management and patient's adherence to plan as established by provider. . Provided education to patient re: stroke prevention, s/s of heart attack and stroke, DASH diet, complications of  uncontrolled blood pressure . Reviewed medications with patient and discussed importance of compliance . Discussed plans with patient for ongoing care management follow up and provided patient with direct contact information for care management team . Advised patient, providing education and rationale, to monitor blood pressure daily and record, calling PCP for findings outside established parameters.  . Reviewed scheduled/upcoming provider appointments including: 06-07-2020 Self-Care Activities: - Self administers medications as prescribed Attends all scheduled provider appointments Calls provider office for new concerns, questions, or BP outside discussed parameters Checks BP and records as discussed Follows a low sodium diet/DASH diet Patient Goals: - check blood pressure weekly - choose a place to take my blood pressure (home, clinic or office, retail store) - write blood pressure results in a log or diary - agree on reward when goals are met - agree to work together to make changes - ask questions to understand - have a family meeting to  talk about healthy habits - learn about high blood pressure - blood pressure trends reviewed - depression screen reviewed - home or ambulatory blood pressure monitoring encouraged Follow Up Plan: Telephone follow up appointment with care management team member scheduled for: 06-30-2020 at 1030 am   Task: RNCM: Identify and Monitor Blood Pressure Elevation   Note:   Care Management Activities:    - blood pressure trends reviewed - depression screen reviewed - home or ambulatory blood pressure monitoring encouraged       Patient Care Plan: RNCM: HLD Management    Problem Identified: RNCM: Management of HLD   Priority: Medium    Long-Range Goal: RNCM: Management of HLD   Priority: Medium  Note:   Current Barriers:  . Poorly controlled hyperlipidemia, complicated by uncontrolled DM . Current antihyperlipidemic regimen: Lipitor 20 Mg  QD . Most recent lipid panel:     Component Value Date/Time   CHOL 235 (H) 03/22/2020 0833   TRIG 242 (H) 03/22/2020 0833   HDL 36 (L) 03/22/2020 0833   CHOLHDL 6.5 (H) 03/22/2020 0833   LDLCALC 158 (H) 03/22/2020 1610 .   Marland Kitchen ASCVD risk enhancing conditions: age 62,  DM, HTN, current smoker . Unable to independently manage chronic conditons  . Lacks social connections . Does not maintain contact with provider office . Does not contact provider office for questions/concerns RN Care Manager Clinical Goal(s):  . patient will work with Consulting civil engineer, providers, and care team towards execution of optimized self-health management plan . patient will verbalize understanding of plan for effective management of HLD  . patient will work with [facility] [place] team  to address needs related to HLD  . patient will attend all scheduled medical appointments: 06-07-2020 Interventions: . Collaboration with Malfi, Lupita Raider, FNP regarding development and update of comprehensive plan of care as evidenced by provider attestation and co-signature . Inter-disciplinary care team collaboration (see longitudinal plan of care) . Medication review performed; medication list updated in electronic medical record.  Bertram Savin care team collaboration (see longitudinal plan of care) . Referred to pharmacy team for assistance with HLD medication management . Evaluation of current treatment plan related to HLD  and patient's adherence to plan as established by provider. . Advised patient to call the office for changes or questions . Provided education to patient re: heart healthy/ADA diet  . Reviewed medications with patient and discussed compliance/cost constraints/questions . Provided patient with dietary restriction  educational materials related to Heart healthy/ADA diet and the benefits of controlling chronic conditions to promote health and well being.  . Reviewed scheduled/upcoming provider appointments  including: 06-07-2020  . Discussed plans with patient for ongoing care management follow up and provided patient with direct contact information for care management team Patient Goals/Self-Care Activities: - call for medicine refill 2 or 3 days before it runs out - call if I am sick and can't take my medicine - keep a list of all the medicines I take; vitamins and herbals too - learn to read medicine labels - use a pillbox to sort medicine - use an alarm clock or phone to remind me to take my medicine - change to whole grain breads, cereal, pasta - drink 6 to 8 glasses of water each day - eat 3 to 5 servings of fruits and vegetables each day - eat 5 or 6 small meals each day - fill half the plate with nonstarchy vegetables - limit fast food meals to no more than 1 per week -  manage portion size - prepare main meal at home 3 to 5 days each week - read food labels for fat, fiber, carbohydrates and portion size - switch to low-fat or skim milk - switch to sugar-free drinks - be open to making changes - I can manage, know and watch for signs of a heart attack - if I have chest pain, call for help - learn about small changes that will make a big difference - learn my personal risk factors  Follow Up Plan: Telephone follow up appointment with care management team member scheduled for: 06-30-2020 at 1030 am       Anthony Bell was given information about Care Management services today including:  1. Care Management services include personalized support from designated clinical staff supervised by his physician, including individualized plan of care and coordination with other care providers 2. 24/7 contact phone numbers for assistance for urgent and routine care needs. 3. The patient may stop CCM services at any time (effective at the end of the month) by phone call to the office staff.  Patient agreed to services and verbal consent obtained.   The patient verbalized understanding of  instructions, educational materials, and care plan provided today and declined offer to receive copy of patient instructions, educational materials, and care plan.   Telephone follow up appointment with care management team member scheduled for: 06-30-2020 at 60 am  Noreene Larsson RN, MSN, [employer]: [phone]   [email or web address]">  DASH Eating Plan DASH stands for Dietary Approaches to Stop Hypertension. The DASH eating plan is a healthy eating plan that has been shown to:  Reduce high blood pressure (hypertension).  Reduce your risk for type 2 diabetes, heart disease, and stroke.  Help with weight loss. What are tips for following this plan? Reading food labels  Check food labels for the amount of salt (sodium) per serving. Choose foods with less than 5 percent of the Daily Value of sodium. Generally, foods with less than 300 milligrams (mg) of sodium per serving fit into this eating plan.  To find whole grains, look for the word "whole" as the first word in the ingredient list. Shopping  Buy products labeled as "low-sodium" or "no salt added."  Buy fresh foods. Avoid canned foods and pre-made or frozen meals. Cooking  Avoid adding salt when cooking. Use salt-free seasonings or herbs instead of table salt or sea salt. Check with your health care provider or pharmacist before using salt substitutes.  Do not fry foods. Cook foods using healthy methods such as baking, boiling, grilling, roasting, and broiling instead.  Cook with heart-healthy oils, such as olive, canola, avocado, soybean, or sunflower oil. Meal planning  Eat a balanced diet that includes: ? 4 or more servings of fruits and 4 or more servings of vegetables each day. Try to fill one-half of your plate with fruits and vegetables. ? 6-8 servings of whole grains each  day. ? Less than 6 oz (170 g) of lean meat, poultry, or fish each day. A 3-oz (85-g) serving of meat is about the same size as a deck of cards. One egg equals 1 oz (28 g). ? 2-3 servings of low-fat dairy each day. One serving is 1 cup (237 mL). ? 1 serving of nuts, seeds, or beans 5 times each week. ? 2-3 servings of heart-healthy fats. Healthy fats called omega-3 fatty acids are found in foods such as walnuts, flaxseeds, fortified  milks, and eggs. These fats are also found in cold-water fish, such as sardines, salmon, and mackerel.  Limit how much you eat of: ? Canned or prepackaged foods. ? Food that is high in trans fat, such as some fried foods. ? Food that is high in saturated fat, such as fatty meat. ? Desserts and other sweets, sugary drinks, and other foods with added sugar. ? Full-fat dairy products.  Do not salt foods before eating.  Do not eat more than 4 egg yolks a week.  Try to eat at least 2 vegetarian meals a week.  Eat more home-cooked food and less restaurant, buffet, and fast food.   Lifestyle  When eating at a restaurant, ask that your food be prepared with less salt or no salt, if possible.  If you drink alcohol: ? Limit how much you use to:  0-1 drink a day for women who are not pregnant.  0-2 drinks a day for men. ? Be aware of how much alcohol is in your drink. In the [place] one drink equals one 12 oz bottle of beer (355 mL), one 5 oz glass of wine (148 mL), or one 1 oz glass of hard liquor (44 mL). General information  Avoid eating more than 2,300 mg of salt a day. If you have hypertension, you may need to reduce your sodium intake to 1,500 mg a day.  Work with your health care provider to maintain a healthy body weight or to lose weight. Ask what an ideal weight is for you.  Get at least 30 minutes of exercise that causes your heart to beat faster (aerobic exercise) most days of the week. Activities may include walking, swimming, or biking.  Work with  your health care provider or dietitian to adjust your eating plan to your individual calorie needs. What foods should I eat? Fruits All fresh, dried, or frozen fruit. Canned fruit in natural juice (without added sugar). Vegetables Fresh or frozen vegetables (raw, steamed, roasted, or grilled). Low-sodium or reduced-sodium tomato and vegetable juice. Low-sodium or reduced-sodium tomato sauce and tomato paste. Low-sodium or reduced-sodium canned vegetables. Grains Whole-grain or whole-wheat bread. Whole-grain or whole-wheat pasta. Brown rice. [place]. Bulgur. Whole-grain and low-sodium cereals. Pita bread. Low-fat, low-sodium crackers. Whole-wheat flour tortillas. Meats and other proteins Skinless chicken or [place]. Ground chicken or [place]. Pork with fat trimmed off. Fish and seafood. Egg whites. Dried beans, peas, or lentils. Unsalted nuts, nut butters, and seeds. Unsalted canned beans. Lean cuts of beef with fat trimmed off. Low-sodium, lean precooked or cured meat, such as sausages or meat loaves. Dairy Low-fat (1%) or fat-free (skim) milk. Reduced-fat, low-fat, or fat-free cheeses. Nonfat, low-sodium ricotta or cottage cheese. Low-fat or nonfat yogurt. Low-fat, low-sodium cheese. Fats and oils Soft margarine without trans fats. Vegetable oil. Reduced-fat, low-fat, or light mayonnaise and salad dressings (reduced-sodium). Canola, safflower, olive, avocado, soybean, and sunflower oils. Avocado. Seasonings and condiments Herbs. Spices. Seasoning mixes without salt. Other foods Unsalted popcorn and pretzels. Fat-free sweets. The items listed above may not be a complete list of foods and beverages you can eat. Contact a dietitian for more information. What foods should I avoid? Fruits Canned fruit in a light or heavy syrup. Fried fruit. Fruit in cream or butter sauce. Vegetables Creamed or fried vegetables. Vegetables in a cheese sauce. Regular canned vegetables (not low-sodium or  reduced-sodium). Regular canned tomato sauce and paste (not low-sodium or reduced-sodium). Regular tomato and vegetable juice (not low-sodium or reduced-sodium). Angie Fava. Olives. Grains Baked  goods made with fat, such as croissants, muffins, or some breads. Dry pasta or rice meal packs. Meats and other proteins Fatty cuts of meat. Ribs. Fried meat. Berniece Salines. Bologna, salami, and other precooked or cured meats, such as sausages or meat loaves. Fat from the back of a pig (fatback). Bratwurst. Salted nuts and seeds. Canned beans with added salt. Canned or smoked fish. Whole eggs or egg yolks. Chicken or [place] with skin. Dairy Whole or 2% milk, cream, and half-and-half. Whole or full-fat cream cheese. Whole-fat or sweetened yogurt. Full-fat cheese. Nondairy creamers. Whipped toppings. Processed cheese and cheese spreads. Fats and oils Butter. Stick margarine. Lard. Shortening. Ghee. Bacon fat. Tropical oils, such as coconut, palm kernel, or palm oil. Seasonings and condiments Onion salt, garlic salt, seasoned salt, table salt, and sea salt. Worcestershire sauce. Tartar sauce. Barbecue sauce. Teriyaki sauce. Soy sauce, including reduced-sodium. Steak sauce. Canned and packaged gravies. Fish sauce. Oyster sauce. Cocktail sauce. Store-bought horseradish. Ketchup. Mustard. Meat flavorings and tenderizers. Bouillon cubes. Hot sauces. Pre-made or packaged marinades. Pre-made or packaged taco seasonings. Relishes. Regular salad dressings. Other foods Salted popcorn and pretzels. The items listed above may not be a complete list of foods and beverages you should avoid. Contact a dietitian for more information. Where to find more information  National Heart, Lung, and Blood Institute: https://wilson-eaton.com/  American Heart Association: www.heart.org  Academy of Nutrition and Dietetics: www.eatright.[place]: www.kidney.org Summary  The DASH eating plan is a healthy eating plan that has  been shown to reduce high blood pressure (hypertension). It may also reduce your risk for type 2 diabetes, heart disease, and stroke.  When on the DASH eating plan, aim to eat more fresh fruits and vegetables, whole grains, lean proteins, low-fat dairy, and heart-healthy fats.  With the DASH eating plan, you should limit salt (sodium) intake to 2,300 mg a day. If you have hypertension, you may need to reduce your sodium intake to 1,500 mg a day.  Work with your health care provider or dietitian to adjust your eating plan to your individual calorie needs. This information is not intended to replace advice given to you by your health care provider. Make sure you discuss any questions you have with your health care provider. Document Revised: 02/13/2019 Document Reviewed: 02/13/2019 Elsevier Patient Education  2021 [place].  Blood Pressure Record Sheet To take your blood pressure, you will need a blood pressure machine. You can buy a blood pressure machine (blood pressure monitor) at your clinic, drug store, or online. When choosing one, consider: An automatic monitor that has an arm cuff. A cuff that wraps snugly around your upper arm. You should be able to fit only one finger between your arm and the cuff. A device that stores blood pressure reading results. Do not choose a monitor that measures your blood pressure from your wrist or finger. Follow your health care provider's instructions for how to take your blood pressure. To use this form: Get one reading in the morning (a.m.) before you take any medicines. Get one reading in the evening (p.m.) before supper. Take at least 2 readings with each blood pressure check. This makes sure the results are correct. Wait 1-2 minutes between measurements. Write down the results in the spaces on this form. Repeat this once a week, or as told by your health care provider. Make a follow-up appointment with your health care provider to discuss the  results. Blood pressure log Date: _______________________ a.m. _____________________(1st reading) _____________________(2nd  reading) p.m. _____________________(1st reading) _____________________(2nd reading) Date: _______________________ a.m. _____________________(1st reading) _____________________(2nd reading) p.m. _____________________(1st reading) _____________________(2nd reading) Date: _______________________ a.m. _____________________(1st reading) _____________________(2nd reading) p.m. _____________________(1st reading) _____________________(2nd reading) Date: _______________________ a.m. _____________________(1st reading) _____________________(2nd reading) p.m. _____________________(1st reading) _____________________(2nd reading) Date: _______________________ a.m. _____________________(1st reading) _____________________(2nd reading) p.m. _____________________(1st reading) _____________________(2nd reading) This information is not intended to replace advice given to you by your health care provider. Make sure you discuss any questions you have with your health care provider. Document Revised: 07/01/2019 Document Reviewed: 07/01/2019 Elsevier Patient Education  2021 [place].  Diabetes Mellitus and Nutrition, Adult When you have diabetes, or diabetes mellitus, it is very important to have healthy eating habits because your blood sugar (glucose) levels are greatly affected by what you eat and drink. Eating healthy foods in the right amounts, at about the same times every day, can help you:  Control your blood glucose.  Lower your risk of heart disease.  Improve your blood pressure.  Reach or maintain a healthy weight. What can affect my meal plan? Every person with diabetes is different, and each person has different needs for a meal plan. Your health care provider may recommend that you work with a dietitian to make a meal plan that is best for you. Your meal plan may vary  depending on factors such as:  The calories you need.  The medicines you take.  Your weight.  Your blood glucose, blood pressure, and cholesterol levels.  Your activity level.  Other health conditions you have, such as heart or kidney disease. How do carbohydrates affect me? Carbohydrates, also called carbs, affect your blood glucose level more than any other type of food. Eating carbs naturally raises the amount of glucose in your blood. Carb counting is a method for keeping track of how many carbs you eat. Counting carbs is important to keep your blood glucose at a healthy level, especially if you use insulin or take certain oral diabetes medicines. It is important to know how many carbs you can safely have in each meal. This is different for every person. Your dietitian can help you calculate how many carbs you should have at each meal and for each snack. How does alcohol affect me? Alcohol can cause a sudden decrease in blood glucose (hypoglycemia), especially if you use insulin or take certain oral diabetes medicines. Hypoglycemia can be a life-threatening condition. Symptoms of hypoglycemia, such as sleepiness, dizziness, and confusion, are similar to symptoms of having too much alcohol.  Do not drink alcohol if: ? Your health care provider tells you not to drink. ? You are pregnant, may be pregnant, or are planning to become pregnant.  If you drink alcohol: ? Do not drink on an empty stomach. ? Limit how much you use to:  0-1 drink a day for women.  0-2 drinks a day for men. ? Be aware of how much alcohol is in your drink. In the [place] one drink equals one 12 oz bottle of beer (355 mL), one 5 oz glass of wine (148 mL), or one 1 oz glass of hard liquor (44 mL). ? Keep yourself hydrated with water, diet soda, or unsweetened iced tea.  Keep in mind that regular soda, juice, and other mixers may contain a lot of sugar and must be counted as carbs. What are tips for following  this plan? Reading food labels  Start by checking the serving size on the "Nutrition Facts" label of packaged foods and drinks. The amount of calories,  carbs, fats, and other nutrients listed on the label is based on one serving of the item. Many items contain more than one serving per package.  Check the total grams (g) of carbs in one serving. You can calculate the number of servings of carbs in one serving by dividing the total carbs by 15. For example, if a food has 30 g of total carbs per serving, it would be equal to 2 servings of carbs.  Check the number of grams (g) of saturated fats and trans fats in one serving. Choose foods that have a low amount or none of these fats.  Check the number of milligrams (mg) of salt (sodium) in one serving. Most people should limit total sodium intake to less than 2,300 mg per day.  Always check the nutrition information of foods labeled as "low-fat" or "nonfat." These foods may be higher in added sugar or refined carbs and should be avoided.  Talk to your dietitian to identify your daily goals for nutrients listed on the label. Shopping  Avoid buying canned, pre-made, or processed foods. These foods tend to be high in fat, sodium, and added sugar.  Shop around the outside edge of the grocery store. This is where you will most often find fresh fruits and vegetables, bulk grains, fresh meats, and fresh dairy. Cooking  Use low-heat cooking methods, such as baking, instead of high-heat cooking methods like deep frying.  Cook using healthy oils, such as olive, canola, or sunflower oil.  Avoid cooking with butter, cream, or high-fat meats. Meal planning  Eat meals and snacks regularly, preferably at the same times every day. Avoid going long periods of time without eating.  Eat foods that are high in fiber, such as fresh fruits, vegetables, beans, and whole grains. Talk with your dietitian about how many servings of carbs you can eat at each  meal.  Eat 4-6 oz (112-168 g) of lean protein each day, such as lean meat, chicken, fish, eggs, or tofu. One ounce (oz) of lean protein is equal to: ? 1 oz (28 g) of meat, chicken, or fish. ? 1 egg. ?  cup (62 g) of tofu.  Eat some foods each day that contain healthy fats, such as avocado, nuts, seeds, and fish.   What foods should I eat? Fruits Berries. Apples. Oranges. Peaches. Apricots. Plums. Grapes. Mango. Papaya. Pomegranate. Kiwi. Cherries. Vegetables Lettuce. Spinach. Leafy greens, including kale, chard, collard greens, and mustard greens. Beets. Cauliflower. Cabbage. Broccoli. Carrots. Green beans. Tomatoes. Peppers. Onions. Cucumbers. Brussels sprouts. Grains Whole grains, such as whole-wheat or whole-grain bread, crackers, tortillas, cereal, and pasta. Unsweetened oatmeal. Quinoa. Brown or wild rice. Meats and other proteins Seafood. Poultry without skin. Lean cuts of poultry and beef. Tofu. Nuts. Seeds. Dairy Low-fat or fat-free dairy products such as milk, yogurt, and cheese. The items listed above may not be a complete list of foods and beverages you can eat. Contact a dietitian for more information. What foods should I avoid? Fruits Fruits canned with syrup. Vegetables Canned vegetables. Frozen vegetables with butter or cream sauce. Grains Refined white flour and flour products such as bread, pasta, snack foods, and cereals. Avoid all processed foods. Meats and other proteins Fatty cuts of meat. Poultry with skin. Breaded or fried meats. Processed meat. Avoid saturated fats. Dairy Full-fat yogurt, cheese, or milk. Beverages Sweetened drinks, such as soda or iced tea. The items listed above may not be a complete list of foods and beverages you should avoid. Contact a  dietitian for more information. Questions to ask a health care provider  Do I need to meet with a diabetes educator?  Do I need to meet with a dietitian?  What number can I call if I have  questions?  When are the best times to check my blood glucose? Where to find more information:  American Diabetes Association: diabetes.org  Academy of Nutrition and Dietetics: www.eatright.[employer] of Diabetes and Digestive and Kidney Diseases: [employer]  Association of Diabetes Care and Education Specialists: www.diabeteseducator.org Summary  It is important to have healthy eating habits because your blood sugar (glucose) levels are greatly affected by what you eat and drink.  A healthy meal plan will help you control your blood glucose and maintain a healthy lifestyle.  Your health care provider may recommend that you work with a dietitian to make a meal plan that is best for you.  Keep in mind that carbohydrates (carbs) and alcohol have immediate effects on your blood glucose levels. It is important to count carbs and to use alcohol carefully. This information is not intended to replace advice given to you by your health care provider. Make sure you discuss any questions you have with your health care provider. Document Revised: 02/17/2019 Document Reviewed: 02/17/2019 Elsevier Patient Education  2021 [employer].

## 2020-06-06 NOTE — Chronic Care Management (AMB) (Signed)
Care Management   Pharmacy Note  06/06/2020 Name: Anthony Bell MRN: [id number] DOB: 11-10-1955  Subjective: Anthony Bell is a 65 y.o. year old male who is a primary care patient of Lorine Bears, Lupita Raider, FNP. The Care Management team was consulted for assistance with care management and care coordination needs.    Engaged with patient by telephone for initial visit in response to provider referral for pharmacy case management and/or care coordination services.   CM Care Guide has already scheduled appointment for patient with CM Pharmacist to complete initial visit, including medication review. Reaching out to patient today for acute need, assistance with obtaining glucometer  Receive message from [place] for assistance as patient having difficulty with obtaining glucometer from pharmacy today.  The patient was given information about Care Management services today including:  1. Care Management services includes personalized support from designated clinical staff supervised by the patient's primary care provider, including individualized plan of care and coordination with other care providers. 2. 24/7 contact phone numbers for assistance for urgent and routine care needs. 3. The patient may stop case management services at any time by phone call to the office staff.  Patient agreed to services and consent obtained.  Assessment:  Review of patient status, including review of consultants reports, laboratory and other test data, was performed as part of comprehensive evaluation and provision of chronic care management services.   SDOH (Social Determinants of Health) assessments and interventions performed:  none   Care Plan  No Known Allergies  Medications Reviewed Today    Reviewed by Vanita Ingles on 06/06/20 at [place] List Status: <None>  Medication Order Taking? Sig Documenting Provider Last Dose Status Informant  amLODipine (NORVASC) 5 MG tablet [id number] No Take  1 tablet (5 mg total) by mouth daily. [name], FNP Taking Active   aspirin EC 81 MG tablet [id number] No Take 81 mg by mouth.  [provider] Taking Active Self  atorvastatin (LIPITOR) 20 MG tablet [id number] No Take 1 tablet (20 mg total) by mouth daily. [name], FNP Taking Active   blood glucose meter kit and supplies KIT [id number]  Dispense based on patient and insurance preference. Use up to four times daily as directed. 100 strips and lancets please [name], NP  Active   Blood Glucose Monitoring Suppl (CONTOUR NEXT EZ) w/Device KIT [id number]  DISPENSE BASED ON PATIENT AND INSURANCE PREFERENCE. USE UP TO FOUR TIMES DAILY AS DIRECTED. (FOR ICD-10 E10.9, E11.9). [name], NP  Active   empagliflozin (JARDIANCE) 10 MG TABS tablet [id number] No Take 1 tablet (10 mg total) by mouth daily before breakfast. Malfi, Lupita Raider, FNP Taking Active   hydrochlorothiazide (HYDRODIURIL) 25 MG tablet [id number] No Take 1 tablet (25 mg total) by mouth daily. Malfi, Lupita Raider, FNP Taking Active   insulin glargine (LANTUS SOLOSTAR) 100 UNIT/ML Solostar Pen [id number]  Inject 20 Units into the skin daily. [name], NP  Active   Insulin Pen Needle 32G X 4 MM MISC [id number]  1 Units by Does not apply route every morning. Pen needles [name], NP  Active   lisinopril (ZESTRIL) 20 MG tablet [id number] No Take 1 tablet (20 mg total) by mouth daily. [name], FNP Taking Active   metFORMIN (GLUCOPHAGE) 1000 MG tablet [id number]  Take 1 tablet (1,000 mg total) by mouth 2 (two) times daily with a meal. [name], NP  Active   tadalafil (CIALIS) 5 MG tablet [id number] No  Take 1 tablet (5 mg total) by mouth daily as needed for erectile dysfunction. Malfi, Lupita Raider, FNP Taking Active           Patient Active Problem List   Diagnosis Date Noted  . Hyperlipidemia 03/23/2020  . Erectile dysfunction 03/22/2020  . Toenail fungus 07/17/2019  . Type 2 diabetes mellitus ([place])  08/23/2017  . Gastroesophageal reflux disease 12/07/2014  . Essential hypertension 12/07/2014  . Rectal cancer ([place]) 12/07/2014  . Tobacco abuse 12/07/2014  . Tubular adenoma polyp of rectum 11/08/2014    Conditions to be addressed/monitored: HTN, HLD and DMII  Care Plan : PharmD Medication Management  Updates made by Vella Raring, [place] since 06/06/2020 12:00 AM    Problem: Disease Progression     Long-Range Goal: Disease Progression Prevented or Minimized   Start Date: 06/06/2020  Expected End Date: 09/04/2020  This Visit's Progress: On track  Priority: High  Note:   Current Barriers:  . Unable to achieve control of blood sugar, as evidence by A1C  14% on 3/10 . Lack of blood sugar results for clinical team  Pharmacist Clinical Goal(s):  Marland Kitchen Over the next 90 days, patient will achieve control of blood sugar as evidenced by A1C <7%  through collaboration with PharmD and provider.  .   Interventions: . 1:1 collaboration with [name], DO regarding development and update of comprehensive plan of care as evidenced by provider attestation and co-signature . Inter-disciplinary care team collaboration (see longitudinal plan of care) . Unable to complete medication review today as patient is currently at work o [place] has already scheduled appointment for patient with CM Pharmacist on 3/23 to complete initial visit, including medication review. Reaching out to patient today for acute need, assistance with obtaining glucometer . Receive message from [facility] Nurse Case Manager for assistance as patient having difficulty with obtaining glucometer from pharmacy today o Note Rxs for [employer] Next meter and supplies sent to [facility] by [name], NP on 3/10 o Note [employer] Contour Next meter is a preferred option through patient's health plan . Collaborate with [facility] Pharmacist Shaun, who advises pharmacy was able to fill Rx for test strips and lancets, but Rx  for meter was somehow deactivated due to an error in [facility] system, requests office resend Rx o Collaborate with PCP who sends Rx for Contour Next meter to pharmacy . Follow up with patient to let him know glucometer and supplies can be picked up from pharmacy. Counsel on resources for learning to use new meter. Patient states his sister will help with new glucometer.   Patient Goals/Self-Care Activities . Over the next 90 days, patient will:  - check glucose  , document, and provide at future appointments  Follow Up Plan: Telephone follow up appointment with care management team member scheduled for: 3/23 at 3:30 pm      Follow Up:  Patient agrees to Care Plan and Follow-up.  Plan: Telephone follow up appointment with care management team member scheduled for:  3/23 at 3:30 pm  Harlow Asa, PharmD, [place] [phone]

## 2020-06-07 ENCOUNTER — Encounter: Payer: Self-pay | Admitting: Unknown Physician Specialty

## 2020-06-07 ENCOUNTER — Other Ambulatory Visit: Payer: Self-pay

## 2020-06-07 ENCOUNTER — Ambulatory Visit (INDEPENDENT_AMBULATORY_CARE_PROVIDER_SITE_OTHER): Payer: BC Managed Care – PPO | Admitting: Unknown Physician Specialty

## 2020-06-07 VITALS — BP 162/60 | HR 62 | Temp 97.3°F | Resp 18 | Ht 66.0 in | Wt 180.6 lb

## 2020-06-07 DIAGNOSIS — E1165 Type 2 diabetes mellitus with hyperglycemia: Secondary | ICD-10-CM | POA: Diagnosis not present

## 2020-06-07 LAB — POCT URINALYSIS DIPSTICK
Bilirubin, UA: NEGATIVE
Blood, UA: NEGATIVE
Glucose, UA: POSITIVE — AB
Ketones, UA: NEGATIVE
Leukocytes, UA: NEGATIVE
Nitrite, UA: NEGATIVE
Protein, UA: NEGATIVE
Spec Grav, UA: 1.01 (ref 1.010–1.025)
Urobilinogen, UA: 0.2 E.U./dL
pH, UA: 5 (ref 5.0–8.0)

## 2020-06-07 NOTE — Progress Notes (Signed)
BP (!) 162/60 (BP Location: Left Arm, Patient Position: Sitting, Cuff Size: Normal)   Pulse 62   Temp (!) 97.3 F (36.3 C) (Temporal)   Resp 18   Ht 5\' 6"  (1.676 m)   Wt 180 lb 9.6 oz (81.9 kg)   SpO2 100%   BMI 29.15 kg/m    Subjective:    Patient ID: Montez Morita, male    DOB: 05-19-1955, 65 y.o.   MRN: [id number]  HPI: Zuriel Roskos is a 65 y.o. male  Chief Complaint  Patient presents with  . Diabetes    Pt concern about the cost of insulin. He state its over $200.00 monthly. Pt checked bs yesterday for the first time. 363mg / dl   Diabetes Pt was started on Lantus last week. Lantus to start at 10 units and to increase by 2 units if fasting blood sugar not below 120. Metformin also increased. Denies GI issues. Pt started taking blood sugar yesterday and it was 373 in the evening. Pt has not increased his Lantus and was unsure if he was suppose to increase daily. He has been taking 10 units each evening. He did purchase his Lantus and is concerned about the cost of $200. Pt reports that he feels "so much better than last week." Pt is also trying to watch what he eats and stay low carb. No new complaints.    Relevant past medical, surgical, family and social history reviewed and updated as indicated. Interim medical history since our last visit reviewed. Allergies and medications reviewed and updated.  Review of Systems  Constitutional: Negative for diaphoresis and fatigue.  Respiratory: Negative for cough and shortness of breath.   Gastrointestinal: Negative for nausea and vomiting.  Endocrine: Positive for polyuria. Negative for polydipsia and polyphagia.  Skin: Negative for rash.  Neurological: Negative for weakness.    Per HPI unless specifically indicated above     Objective:    BP (!) 162/60 (BP Location: Left Arm, Patient Position: Sitting, Cuff Size: Normal)   Pulse 62   Temp (!) 97.3 F (36.3 C) (Temporal)   Resp 18   Ht 5\' 6"  (1.676 m)   Wt 180 lb 9.6  oz (81.9 kg)   SpO2 100%   BMI 29.15 kg/m   Wt Readings from Last 3 Encounters:  06/07/20 180 lb 9.6 oz (81.9 kg)  06/02/20 178 lb (80.7 kg)  03/22/20 194 lb (88 kg)    Physical Exam Constitutional:      Appearance: Normal appearance.  HENT:     Head: Normocephalic.     Nose: Nose normal.     Mouth/Throat:     Mouth: Mucous membranes are moist.  Cardiovascular:     Rate and Rhythm: Normal rate.     Pulses: Normal pulses.     Heart sounds: Normal heart sounds.  Pulmonary:     Effort: Pulmonary effort is normal.     Breath sounds: Normal breath sounds.  Abdominal:     General: Bowel sounds are normal.     Tenderness: There is no abdominal tenderness.  Musculoskeletal:        General: Normal range of motion.  Skin:    General: Skin is warm and dry.  Neurological:     Mental Status: He is alert and oriented to person, place, and time.  Psychiatric:        Mood and Affect: Mood normal.        Behavior: Behavior normal.     Results for orders  placed or performed in visit on 06/07/20  POCT Urinalysis Dipstick  Result Value Ref Range   Color, UA Yellow    Clarity, UA clear    Glucose, UA Positive (A) Negative   Bilirubin, UA negative    Ketones, UA negative    Spec Grav, UA 1.010 1.010 - 1.025   Blood, UA negative    pH, UA 5.0 5.0 - 8.0   Protein, UA Negative Negative   Urobilinogen, UA 0.2 0.2 or 1.0 E.U./dL   Nitrite, UA negative    Leukocytes, UA Negative Negative   Appearance     Odor        Assessment & Plan:   Problem List Items Addressed This Visit      Endocrine   Type 2 diabetes mellitus ([place]) - Primary    Pt started on Lantus last visit and increase in Metformin. Re-educated on checking blood sugar and changes to dosage based on fasting blood sugar. Increase insulin by 2 units daily if fasting blood sugar is above 120. Gave sample of [name] as that is in formulary and tier 1 for insurance due to concern for cost. Today's urine no longer showing  ketones. Will f/u in 1 week.       Relevant Orders   POCT Urinalysis Dipstick (Completed)       Follow up plan: Return in about 1 week (around 06/14/2020).

## 2020-06-07 NOTE — Addendum Note (Signed)
Addended by: [name] on: 06/07/2020 09:09 AM   Modules accepted: Level of Service

## 2020-06-07 NOTE — Patient Instructions (Signed)
Base your long acting insulin on your fasting (usually in the morning) blood sugar.  Increase long acting (daily insulin) 2 units if fasting blood sugar is greater than 120.  Decrease by 2 units if fasting blood sugar is less than 95.    

## 2020-06-07 NOTE — Assessment & Plan Note (Addendum)
Pt started on Lantus last visit and increase in Metformin. Re-educated on checking blood sugar and changes to dosage based on fasting blood sugar. Increase insulin by 2 units daily if fasting blood sugar is above 120. Gave sample of [name] as that is in formulary and tier 1 for insurance due to concern for cost. Today's urine no longer showing ketones. Will f/u in 1 week.

## 2020-06-14 ENCOUNTER — Other Ambulatory Visit: Payer: Self-pay

## 2020-06-14 ENCOUNTER — Encounter: Payer: Self-pay | Admitting: Unknown Physician Specialty

## 2020-06-14 ENCOUNTER — Ambulatory Visit (INDEPENDENT_AMBULATORY_CARE_PROVIDER_SITE_OTHER): Payer: BC Managed Care – PPO | Admitting: Unknown Physician Specialty

## 2020-06-14 VITALS — BP 158/63 | HR 63 | Temp 96.9°F | Ht 66.0 in | Wt 186.4 lb

## 2020-06-14 DIAGNOSIS — J42 Unspecified chronic bronchitis: Secondary | ICD-10-CM | POA: Diagnosis not present

## 2020-06-14 DIAGNOSIS — E1165 Type 2 diabetes mellitus with hyperglycemia: Secondary | ICD-10-CM

## 2020-06-14 DIAGNOSIS — J441 Chronic obstructive pulmonary disease with (acute) exacerbation: Secondary | ICD-10-CM

## 2020-06-14 DIAGNOSIS — J449 Chronic obstructive pulmonary disease, unspecified: Secondary | ICD-10-CM | POA: Insufficient documentation

## 2020-06-14 MED ORDER — AZITHROMYCIN 250 MG PO TABS: ORAL_TABLET | ORAL | 0 refills | Status: DC

## 2020-06-14 NOTE — Assessment & Plan Note (Signed)
Productive cough, no fever. Patient has inhaler at home that he will begin taking. Prescribed azythromycin. Will follow up in 4 weeks.

## 2020-06-14 NOTE — Progress Notes (Signed)
BP (!) 158/63 (BP Location: Left Arm, Patient Position: Sitting, Cuff Size: Normal)   Pulse 63   Temp (!) 96.9 F (36.1 C) (Temporal)   Ht 5\' 6"  (1.676 m)   Wt 186 lb 6.4 oz (84.6 kg)   SpO2 100%   BMI 30.09 kg/m    Subjective:    Patient ID: Anthony Bell, male    DOB: March 26, 1956, 65 y.o.   MRN: [id number]  HPI: Anthony Bell is a 65 y.o. male  Chief Complaint  Patient presents with  . Diabetes    Pt was able to bring down his blood sugar to 157.  Marland Kitchen Nasal Congestion    Pt says he started with some stuffed nose on this past Thursday which he is using nasal spray and it helps out. Also taking some OTC medications.   Diabetes Patient checked blood sugar this am and it was 157. Patient thinks he is at 24 units of Lantus as of last night, but wife is the one dialing the pen for the patient. Patient reports feeling much better and that polyuria has resolved.   Nasal Congestion Patient states he developed a cold last Thursday that has almost resolved. Patient took Alkaseltzer plus and nasal spray for congestion. Patient states ears were congested but that has resolved. He does endorse coughing up clear, thin sputum. Denies shortness of breath and fever.    Relevant past medical, surgical, family and social history reviewed and updated as indicated. Interim medical history since our last visit reviewed. Allergies and medications reviewed and updated.  Review of Systems  Constitutional: Negative for fatigue and fever.  HENT: Positive for congestion.   Respiratory: Positive for cough. Negative for shortness of breath and wheezing.        Productive cough with clear, thin sputum.   Cardiovascular: Negative for chest pain and palpitations.  Gastrointestinal: Negative for constipation, diarrhea, nausea and vomiting.  Endocrine: Negative for polyuria.  Genitourinary: Negative.   Musculoskeletal: Negative.   Skin: Negative.   Neurological: Negative for dizziness and headaches.     Per HPI unless specifically indicated above     Objective:    BP (!) 158/63 (BP Location: Left Arm, Patient Position: Sitting, Cuff Size: Normal)   Pulse 63   Temp (!) 96.9 F (36.1 C) (Temporal)   Ht 5\' 6"  (1.676 m)   Wt 186 lb 6.4 oz (84.6 kg)   SpO2 100%   BMI 30.09 kg/m   Wt Readings from Last 3 Encounters:  06/14/20 186 lb 6.4 oz (84.6 kg)  06/07/20 180 lb 9.6 oz (81.9 kg)  06/02/20 178 lb (80.7 kg)    Physical Exam Constitutional:      General: He is not in acute distress.    Appearance: Normal appearance.  HENT:     Head: Normocephalic.     Right Ear: Hearing, tympanic membrane, ear canal and external ear normal.     Left Ear: Hearing, tympanic membrane, ear canal and external ear normal.     Nose: Mucosal edema present.  Cardiovascular:     Rate and Rhythm: Normal rate and regular rhythm.     Pulses: Normal pulses.     Heart sounds: Normal heart sounds.  Pulmonary:     Effort: Pulmonary effort is normal.     Breath sounds: Rhonchi present.  Abdominal:     General: Bowel sounds are normal.     Palpations: Abdomen is soft.  Skin:    General: Skin is warm and dry.  Capillary Refill: Capillary refill takes less than 2 seconds.  Neurological:     Mental Status: He is alert and oriented to person, place, and time.  Psychiatric:        Mood and Affect: Mood normal.        Behavior: Behavior normal.     Results for orders placed or performed in visit on 06/07/20  POCT Urinalysis Dipstick  Result Value Ref Range   Color, UA Yellow    Clarity, UA clear    Glucose, UA Positive (A) Negative   Bilirubin, UA negative    Ketones, UA negative    Spec Grav, UA 1.010 1.010 - 1.025   Blood, UA negative    pH, UA 5.0 5.0 - 8.0   Protein, UA Negative Negative   Urobilinogen, UA 0.2 0.2 or 1.0 E.U./dL   Nitrite, UA negative    Leukocytes, UA Negative Negative   Appearance     Odor        Assessment & Plan:   Problem List Items Addressed This Visit       Respiratory   COPD (chronic obstructive pulmonary disease) (HCC)    Productive cough, no fever. Patient has inhaler at home that he will begin taking. Prescribed azythromycin. Will follow up in 4 weeks.      Relevant Medications   azithromycin (ZITHROMAX Z-PAK) 250 MG tablet     Endocrine   Type 2 diabetes mellitus ([place]) - Primary    Today's fasting blood sugar was 157. Patient currently at 24 units of Lantus in the evening. Discussed with patient to keep increasing by 2 units nightly to result in fasting blood sugar below 120. Will continue with current regimen of Lantus, Jardiance, and Metformin. Follow up in 4 weeks.           Follow up plan: Return in about 4 weeks (around 07/12/2020).

## 2020-06-14 NOTE — Assessment & Plan Note (Signed)
Today's fasting blood sugar was 157. Patient currently at 24 units of Lantus in the evening. Discussed with patient to keep increasing by 2 units nightly to result in fasting blood sugar below 120. Will continue with current regimen of Lantus, Jardiance, and Metformin. Follow up in 4 weeks.

## 2020-06-15 ENCOUNTER — Ambulatory Visit: Payer: BC Managed Care – PPO | Admitting: Pharmacist

## 2020-06-15 DIAGNOSIS — E785 Hyperlipidemia, unspecified: Secondary | ICD-10-CM

## 2020-06-15 DIAGNOSIS — E1165 Type 2 diabetes mellitus with hyperglycemia: Secondary | ICD-10-CM

## 2020-06-15 DIAGNOSIS — I10 Essential (primary) hypertension: Secondary | ICD-10-CM

## 2020-06-15 NOTE — Patient Instructions (Signed)
Visit Information  PATIENT GOALS: Goals Addressed            This Visit's Progress   . Pharmacy Goals       Our goal A1c is less than 7%. This corresponds with fasting sugars less than 130 and 2 hour after meal sugars less than 180. Please check your blood sugar and keep a log of the results  Our goal bad cholesterol, or LDL, is less than 70 . This is why it is important to continue taking your atorvastatin  Our goal blood pressure is less than 130/80. Please check your blood pressure and keep log of the results.   Feel free to call me with any questions or concerns. I look forward to our next call!  Harlow Asa, PharmD, [place] [phone]       The patient verbalized understanding of instructions, educational materials, and care plan provided today and declined offer to receive copy of patient instructions, educational materials, and care plan.   Telephone follow up appointment with care management team member scheduled for: 3/30 at 4:15 pm  Harlow Asa, PharmD, [place] [phone]

## 2020-06-15 NOTE — Chronic Care Management (AMB) (Signed)
Care Management   Pharmacy Note  06/15/2020 Name: Anthony Bell MRN: [id number] DOB: Oct 23, 1955  Subjective: Anthony Bell is a 65 y.o. year old male who is a primary care patient of Lorine Bears, Lupita Raider, FNP. The Care Management team was consulted for assistance with care management and care coordination needs.    Engaged with patient by telephone for follow up visit in response to provider referral for pharmacy case management and/or care coordination services.   The patient was given information about Care Management services today including:  1. Care Management services includes personalized support from designated clinical staff supervised by the patient's primary care provider, including individualized plan of care and coordination with other care providers. 2. 24/7 contact phone numbers for assistance for urgent and routine care needs. 3. The patient may stop case management services at any time by phone call to the office staff.  Patient agreed to services and consent obtained.  Assessment:  Review of patient status, including review of consultants reports, laboratory and other test data, was performed as part of comprehensive evaluation and provision of chronic care management services.   SDOH (Social Determinants of Health) assessments and interventions performed:  none  Objective:  Lab Results  Component Value Date   CREATININE 1.21 06/02/2020   CREATININE 1.47 (H) 03/22/2020   CREATININE 1.55 (H) 02/02/2018    Lab Results  Component Value Date   HGBA1C 14.0 (A) 06/02/2020       Component Value Date/Time   CHOL 235 (H) 03/22/2020 0833   TRIG 242 (H) 03/22/2020 0833   HDL 36 (L) 03/22/2020 0833   CHOLHDL 6.5 (H) 03/22/2020 0833   LDLCALC 158 (H) 03/22/2020 0833    Clinical ASCVD: No  The 10-year ASCVD risk score Mikey Bussing DC Jr., et al., 2013) is: 60.6%   Values used to calculate the score:     Age: 70 years     Sex: Male     Is Non-Hispanic African American:  Yes     Diabetic: Yes     Tobacco smoker: Yes     Systolic Blood Pressure: 941 mmHg     Is BP treated: Yes     HDL Cholesterol: 36 mg/dL     Total Cholesterol: 235 mg/dL    BP Readings from Last 3 Encounters:  06/14/20 (!) 158/63  06/07/20 (!) 162/60  06/02/20 112/62    Care Plan  No Known Allergies  Medications Reviewed Today    Reviewed by Vella Raring, [place] (Pharmacist) on 06/15/20 at [facility] List Status: <None>  Medication Order Taking? Sig Documenting Provider Last Dose Status Informant  amLODipine (NORVASC) 5 MG tablet [id number] No Take 1 tablet (5 mg total) by mouth daily.  Patient not taking: Reported on 06/15/2020   [name], FNP Not Taking Active   aspirin EC 81 MG tablet [id number] Yes Take 81 mg by mouth.  [provider] Taking Active Self  atorvastatin (LIPITOR) 20 MG tablet [id number]  Take 1 tablet (20 mg total) by mouth daily. [name], FNP  Active   azithromycin (ZITHROMAX Z-PAK) 250 MG tablet [id number] No As directed  Patient not taking: Reported on 06/15/2020   [name], NP Not Taking Active   blood glucose meter kit and supplies KIT [id number]  Dispense based on patient and insurance preference. Use up to four times daily as directed. 100 strips and lancets please [name], NP  Active   Blood Glucose Monitoring Suppl (CONTOUR NEXT EZ) w/Device KIT [id number]  USE UP TO FOUR TIMES DAILY AS DIRECTED. (FOR ICD-10 E10.9, E11.9). Karamalegos, Devonne Doughty, DO  Active   empagliflozin (JARDIANCE) 10 MG TABS tablet [id number] Yes Take 1 tablet (10 mg total) by mouth daily before breakfast. Malfi, Lupita Raider, FNP Taking Active   hydrochlorothiazide (HYDRODIURIL) 25 MG tablet [id number] Yes Take 1 tablet (25 mg total) by mouth daily. Malfi, Lupita Raider, FNP Taking Active   insulin glargine (LANTUS SOLOSTAR) 100 UNIT/ML Solostar Pen [id number] Yes Inject 20 Units into the skin daily.  Patient taking differently: Inject 24 Units into the skin  daily.   [name], NP Taking Active   Insulin Pen Needle 32G X 4 MM MISC [id number]  1 Units by Does not apply route every morning. Pen needles [name], NP  Active   lisinopril (ZESTRIL) 20 MG tablet [id number] Yes Take 1 tablet (20 mg total) by mouth daily. Malfi, Lupita Raider, FNP Taking Active   metFORMIN (GLUCOPHAGE) 1000 MG tablet [id number] Yes Take 1 tablet (1,000 mg total) by mouth 2 (two) times daily with a meal. [name], NP Taking Active   tadalafil (CIALIS) 5 MG tablet [id number] Yes Take 1 tablet (5 mg total) by mouth daily as needed for erectile dysfunction. Malfi, Lupita Raider, FNP Taking Active           Patient Active Problem List   Diagnosis Date Noted  . COPD (chronic obstructive pulmonary disease) ([place]) 06/14/2020  . Hyperlipidemia 03/23/2020  . Erectile dysfunction 03/22/2020  . Toenail fungus 07/17/2019  . Type 2 diabetes mellitus ([place]) 08/23/2017  . Gastroesophageal reflux disease 12/07/2014  . Essential hypertension 12/07/2014  . Rectal cancer ([place]) 12/07/2014  . Tobacco abuse 12/07/2014  . 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 Financial barriers . Lack of blood pressure results for clinical team  Pharmacist Clinical Goal(s):  Marland Kitchen Over the next 90 days, patient will achieve control of blood sugar as evidenced by A1C <7%  through collaboration with PharmD and provider.   Interventions: . 1:1 collaboration with [name], DO regarding development and update of comprehensive plan of care as evidenced by  provider attestation and co-signature . Inter-disciplinary care team collaboration (see longitudinal plan of care) . Comprehensive medication review performed; medication list updated in electronic medical record . Perform chart review. Note patient seen in office by NP [name] yesterday for follow up and regarding productive cough. o Provider advised patient to start course of azithromycin . Today patient reports he was unable to start taking azithromycin prescription as when [facility] called [facility] yesterday, pharmacy reported Rx not yet received . Follow up with [facility] today and speak with Shaun who confirms received and filled azithromycin Rx for patient . Encourage patient to pick up and start taking azithromycin Rx as directed  T2DM: . Uncontrolled; current treatment: o Jardiance 10 mg daily before breakfast o Metformin 1000 mg twice daily with meals o Lantus 24 units daily in evening . From review of chart, note at Office Visit with [name] NP on 3/15, provider advised patient "  base your long acting insulin on your fasting (usually in the morning) blood sugar.  Increase long acting (daily insulin) 2 units if fasting blood sugar is greater than 120.  Decrease by 2 units if fasting blood sugar is less than 95." o Patient reports has adjusted his dose according to these instructions . Patient confirms obtained glucometer from pharmacy and has been monitoring home blood sugar . Reports fasting home blood sugar results: o Today: 120 . Patient denies s/s of hypoglycemia . Counsel patient on s/s of and how to manage episodes of hypoglycemia . Encourage patient to continue to monitor home blood sugar, keep log of results and bring record to medical appointments . Encourage patient to schedule appointment for annual eye exam  Hypertension: . Uncontrolled; current treatment: o HCTZ 25 mg daily o Lisinopril 20 mg daily o Identify patient not currently taking amlodipine - From review  of chart, note on 12/28 PCP advised patient to begin taking amlodipine 5 mg daily and Check BP 1-2 x per week at home, keep log, and bring to clinic at next appointment - Patient looks around his home during our call and locates amlodipine pill bottle. - Reports will start taking amlodipine today as directed . Patient denies monitoring home BP, but report can have this checked at work . Note BP results from recent office visits: o 3/22: 158/63, HR 63 o 3/15: 162/60, HR 62 . Encourage patient to start monitoring BP and keep log of results to review during our next call  Hyperlipidemia: . Uncontrolled; current treatment: o Identify patient not currently taking atorvastatin as directed - From review of dispensing history and collaboration with [facility], find atorvastatin Rx not refilled in >3 months . Request [facility] fill atorvastatin Rx for patient today . Encourage patient to pick up and restart taking atorvastatin Rx as directed  Medication Assistance: . Patient reports difficulty with affording both Jardiance and Lantus prescriptions through his [employer] . Speak with Shaun at [facility] who advises patient's current copayments are $150/month for Lantus and $44.99/month for Jardiance . Assist patient with obtaining Lantus co-pay savings card and Jardiance co-pay savings card from manufacturer websites today . Provide co-pay savings card billing information to [facility]  Medication Adherence: . Reports his wife help to manage his medications including his Lantus nightly . Reports currently takes medications from pill bottles . Encourage patient to obtain and start using weekly pillboxes as adherence aid  Patient Goals/Self-Care Activities . Over the next 90 days, patient will:  - check glucose, document, and provide at future appointments - check blood pressure, document, and provide at future appointments  Follow Up Plan: Telephone follow up appointment with  care management team member scheduled for: 3/30 at 4:15 pm      Medication Assistance:  Co-pay cards for Jardiance and Lantus obtained through manufacturer websites  Follow Up:  Patient agrees to Care Plan and Follow-up.  Plan: Telephone follow up appointment with care management team member scheduled for:  3/30 at 4:15 pm  Harlow Asa, PharmD, [place] [phone]

## 2020-06-22 ENCOUNTER — Ambulatory Visit: Payer: BC Managed Care – PPO | Admitting: Pharmacist

## 2020-06-22 DIAGNOSIS — E785 Hyperlipidemia, unspecified: Secondary | ICD-10-CM

## 2020-06-22 DIAGNOSIS — E1165 Type 2 diabetes mellitus with hyperglycemia: Secondary | ICD-10-CM

## 2020-06-22 DIAGNOSIS — I10 Essential (primary) hypertension: Secondary | ICD-10-CM

## 2020-06-22 NOTE — Chronic Care Management (AMB) (Signed)
Care Management   Pharmacy Note  06/22/2020 Name: Anthony Bell MRN: [id number] DOB: [birth year]  Subjective: Anthony Bell is a 65 y.o. year old male who is a primary care patient of Lorine Bears, Lupita Raider, FNP. The Care Management team was consulted for assistance with care management and care coordination needs.    Engaged with patient by telephone for follow up visit in response to provider referral for pharmacy case management and/or care coordination services.   The patient was given information about Care Management services today including:  1. Care Management services includes personalized support from designated clinical staff supervised by the patient's primary care provider, including individualized plan of care and coordination with other care providers. 2. 24/7 contact phone numbers for assistance for urgent and routine care needs. 3. The patient may stop case management services at any time by phone call to the office staff.  Patient agreed to services and consent obtained.  Assessment:  Review of patient status, including review of consultants reports, laboratory and other test data, was performed as part of comprehensive evaluation and provision of chronic care management services.   SDOH (Social Determinants of Health) assessments and interventions performed:  none  Objective:  Lab Results  Component Value Date   CREATININE 1.21 06/02/2020   CREATININE 1.47 (H) 03/22/2020   CREATININE 1.55 (H) 02/02/2018    Lab Results  Component Value Date   HGBA1C 14.0 (A) 06/02/2020       Component Value Date/Time   CHOL 235 (H) 03/22/2020 0833   TRIG 242 (H) 03/22/2020 0833   HDL 36 (L) 03/22/2020 0833   CHOLHDL 6.5 (H) 03/22/2020 0833   LDLCALC 158 (H) 03/22/2020 0833    Clinical ASCVD: No  The 10-year ASCVD risk score Mikey Bussing DC Jr., et al., 2013) is: 60.6%   Values used to calculate the score:     Age: 60 years     Sex: Male     Is Non-Hispanic African American:  Yes     Diabetic: Yes     Tobacco smoker: Yes     Systolic Blood Pressure: 409 mmHg     Is BP treated: Yes     HDL Cholesterol: 36 mg/dL     Total Cholesterol: 235 mg/dL     BP Readings from Last 3 Encounters:  06/14/20 (!) 158/63  06/07/20 (!) 162/60  06/02/20 112/62    Care Plan  No Known Allergies  Medications Reviewed Today    Reviewed by Vella Raring, RPH-CPP (Pharmacist) on 06/22/20 at 1632  Med List Status: <None>  Medication Order Taking? Sig Documenting Provider Last Dose Status Informant  amLODipine (NORVASC) 5 MG tablet [id number] Yes Take 1 tablet (5 mg total) by mouth daily. [name], FNP Taking Active   aspirin EC 81 MG tablet [id number]  Take 81 mg by mouth.  [provider]  Active Self  atorvastatin (LIPITOR) 20 MG tablet [id number] Yes Take 1 tablet (20 mg total) by mouth daily. [name], FNP Taking Active   blood glucose meter kit and supplies KIT [id number]  Dispense based on patient and insurance preference. Use up to four times daily as directed. 100 strips and lancets please [name], NP  Active   Blood Glucose Monitoring Suppl (CONTOUR NEXT EZ) w/Device KIT [id number]  USE UP TO FOUR TIMES DAILY AS DIRECTED. (FOR ICD-10 E10.9, E11.9). [name], DO  Active   empagliflozin (JARDIANCE) 10 MG TABS tablet [id number] Yes Take 1 tablet (10 mg total) by  mouth daily before breakfast. Malfi, Lupita Raider, FNP Taking Active   hydrochlorothiazide (HYDRODIURIL) 25 MG tablet [id number] Yes Take 1 tablet (25 mg total) by mouth daily. Malfi, Lupita Raider, FNP Taking Active   insulin glargine (LANTUS SOLOSTAR) 100 UNIT/ML Solostar Pen [id number]  Inject 20 Units into the skin daily.  Patient taking differently: Inject 24 Units into the skin daily.   [name], NP  Active   Insulin Pen Needle 32G X 4 MM MISC [id number]  1 Units by Does not apply route every morning. Pen needles [name], NP  Active   lisinopril (ZESTRIL) 20 MG  tablet [id number] Yes Take 1 tablet (20 mg total) by mouth daily. Malfi, Lupita Raider, FNP Taking Active   metFORMIN (GLUCOPHAGE) 1000 MG tablet [id number] Yes Take 1 tablet (1,000 mg total) by mouth 2 (two) times daily with a meal. [name], NP Taking Active   tadalafil (CIALIS) 5 MG tablet [id number]  Take 1 tablet (5 mg total) by mouth daily as needed for erectile dysfunction. [name], FNP  Active           Patient Active Problem List   Diagnosis Date Noted  . COPD (chronic obstructive pulmonary disease) ([place]) 06/14/2020  . Hyperlipidemia 03/23/2020  . Erectile dysfunction 03/22/2020  . Toenail fungus 07/17/2019  . Type 2 diabetes mellitus ([place]) 08/23/2017  . Gastroesophageal reflux disease 12/07/2014  . Essential hypertension 12/07/2014  . Rectal cancer ([place]) 12/07/2014  . Tobacco abuse 12/07/2014  . Tubular adenoma polyp of rectum 11/08/2014    Conditions to be addressed/monitored: HTN, HLD and DMII  Care Plan : PharmD Medication Management  Updates made by Vella Raring, RPH-CPP since 06/22/2020 12:00 AM    Problem: Disease Progression     Long-Range Goal: Disease Progression Prevented or Minimized   Start Date: 06/06/2020  Expected End Date: 09/04/2020  This Visit's Progress: On track  Recent Progress: On track  Priority: High  Note:   Current Barriers:  . Financial barriers . Lack of blood pressure results for clinical team  Pharmacist Clinical Goal(s):  Marland Kitchen Over the next 90 days, patient will achieve control of blood sugar as evidenced by A1C <7%  through collaboration with PharmD and provider.   Interventions: . 1:1 collaboration with [name], DO regarding development and update of comprehensive plan of care as evidenced by provider attestation and co-signature . Inter-disciplinary care team collaboration (see longitudinal plan of care) . Comprehensive medication review performed; medication list updated in electronic medical  record . Today patient reports that he picked up and completed course of azithromycin as prescribed by [name], NP. Reports symptoms are improved, but still has a runny nose  T2DM: . Uncontrolled; current treatment: o Jardiance 10 mg daily before breakfast o Metformin 1000 mg twice daily with meals o Lantus 20 units daily in evening . Report recent home blood sugar results: o 3/29: 100 (before lunch) o Wife reports they have not been checking morning fasting blood sugar, usually checking in afternoon or evening . Reiterate instruction to patient and wife from [name] NP from visit note on 3/15: "base your long acting insulin on your fasting (usually in the morning) blood sugar.  Increase long acting (daily insulin) 2 units if fasting blood sugar is greater than 120.  Decrease by 2 units if fasting blood sugar is less than 95." o Patient and wife verbalize understanding. o Wife states that she has AVS print out from 3/15 at home and will locate  to use as reference for making any adjustments o Reports they will start checking patient's fasting blood sugar and keeping log of results . Patient denies s/s of hypoglycemia . Have counseled patient on s/s of and how to manage episodes of hypoglycemia . Will mail patient blood sugar log as requested . Have encouraged patient to schedule appointment for annual eye exam  Hypertension: . Uncontrolled; current treatment: o HCTZ 25 mg daily o Lisinopril 20 mg daily o Amlodipine 5 mg daily - Reports resumed taking amlodipine as directed . Reports wife checked his BP on Sunday, but denies recording the result. Recalls SBP ~120 o Reports will ask nurse at work to check his BP . Encourage patient to monitor BP and keep log of results to review during our next call . Will mail patient blood pressure log as requested  Hyperlipidemia: . Uncontrolled; current treatment: o Atorvastatin 20 mg daily - Confirms picked up and restart taking  atorvastatin Rx as directed  Medication Assistance: . Confirms Jardiance and Lantus prescriptions now affordable to him through co-pay savings cards from manufacturer  Medication Adherence: . Note his wife help to manage his medications including his Lantus nightly . Reports currently takes medications from pill bottles . Again encourage patient to obtain and start using weekly pillboxes as adherence aid  Patient Goals/Self-Care Activities . Over the next 90 days, patient will:  - check glucose, document, and provide at future appointments - check blood pressure, document, and provide at future appointments  Follow Up Plan: Telephone follow up appointment with care management team member scheduled for: 07/04/2020 at 4:30 PM      Medication Assistance: Co-pay cards for Jardiance and Lantus obtained through manufacturer websites  Follow Up:  Patient agrees to Care Plan and Follow-up.  Plan: Telephone follow up appointment with care management team member scheduled for:  07/04/2020 at 4:30 PM  Harlow Asa, PharmD, [place], [facility] [place] [phone]

## 2020-06-22 NOTE — Patient Instructions (Signed)
Visit Information  PATIENT GOALS: Goals Addressed            This Visit's Progress   . Pharmacy Goals       Our goal A1c is less than 7%. This corresponds with fasting sugars less than 130 and 2 hour after meal sugars less than 180. Please check your fasting blood sugar and keep a log of the results  Our goal bad cholesterol, or LDL, is less than 70 . This is why it is important to continue taking your atorvastatin  Our goal blood pressure is less than 130/80. Please check your blood pressure and keep log of the results.   Feel free to call me with any questions or concerns. I look forward to our next call!  Harlow Asa, PharmD, [place] [phone]       The patient verbalized understanding of instructions, educational materials, and care plan provided today and declined offer to receive copy of patient instructions, educational materials, and care plan.   Telephone follow up appointment with care management team member scheduled for: 07/04/2020 at 4:30 PM  Harlow Asa, PharmD, [place], [facility] [place] [phone]

## 2020-06-28 ENCOUNTER — Other Ambulatory Visit: Payer: Self-pay | Admitting: Unknown Physician Specialty

## 2020-06-30 ENCOUNTER — Telehealth: Payer: Self-pay | Admitting: General Practice

## 2020-06-30 ENCOUNTER — Telehealth: Payer: Self-pay

## 2020-06-30 NOTE — Telephone Encounter (Signed)
  Chronic Care Management   Outreach Note  06/30/2020 Name: Anthony Bell MRN: [id number] DOB: [birth year]  Referred by: [name], FNP Reason for referral : Appointment (RNCM: Care Coordination for Chronic Disease Management and Care Coordination Needs)   An unsuccessful telephone outreach was attempted today. The patient was referred to the case management team for assistance with care management and care coordination.   Follow Up Plan: The care management team will reach out to the patient again over the next 30 days.   Noreene Larsson RN, MSN, [place] Mobile: [phone]

## 2020-07-04 ENCOUNTER — Ambulatory Visit: Payer: Self-pay | Admitting: Pharmacist

## 2020-07-04 DIAGNOSIS — I10 Essential (primary) hypertension: Secondary | ICD-10-CM

## 2020-07-04 DIAGNOSIS — E1165 Type 2 diabetes mellitus with hyperglycemia: Secondary | ICD-10-CM

## 2020-07-04 DIAGNOSIS — E785 Hyperlipidemia, unspecified: Secondary | ICD-10-CM

## 2020-07-04 NOTE — Chronic Care Management (AMB) (Signed)
Care Management   Pharmacy Note  07/04/2020 Name: Anthony Bell MRN: [id number] DOB: 01/09/1956  Subjective: Anthony Bell is a 65 y.o. year old male who is a primary care patient of Lorine Bears, Lupita Raider, FNP. The Care Management team was consulted for assistance with care management and care coordination needs.    Engaged with patient by telephone for follow up visit in response to provider referral for pharmacy case management and/or care coordination services.   The patient was given information about Care Management services today including:  1. Care Management services includes personalized support from designated clinical staff supervised by the patient's primary care provider, including individualized plan of care and coordination with other care providers. 2. 24/7 contact phone numbers for assistance for urgent and routine care needs. 3. The patient may stop case management services at any time by phone call to the office staff.  Patient agreed to services and consent obtained.  Assessment:  Review of patient status, including review of consultants reports, laboratory and other test data, was performed as part of comprehensive evaluation and provision of chronic care management services.   SDOH (Social Determinants of Health) assessments and interventions performed:  none  Objective:  Lab Results  Component Value Date   CREATININE 1.21 06/02/2020   CREATININE 1.47 (H) 03/22/2020   CREATININE 1.55 (H) 02/02/2018    Lab Results  Component Value Date   HGBA1C 14.0 (A) 06/02/2020       Component Value Date/Time   CHOL 235 (H) 03/22/2020 0833   TRIG 242 (H) 03/22/2020 0833   HDL 36 (L) 03/22/2020 0833   CHOLHDL 6.5 (H) 03/22/2020 0833   LDLCALC 158 (H) 03/22/2020 0833    Clinical ASCVD: No  The 10-year ASCVD risk score Mikey Bussing DC Jr., et al., 2013) is: 60.6%   Values used to calculate the score:     Age: 19 years     Sex: Male     Is Non-Hispanic African American:  Yes     Diabetic: Yes     Tobacco smoker: Yes     Systolic Blood Pressure: 244 mmHg     Is BP treated: Yes     HDL Cholesterol: 36 mg/dL     Total Cholesterol: 235 mg/dL     BP Readings from Last 3 Encounters:  06/14/20 (!) 158/63  06/07/20 (!) 162/60  06/02/20 112/62    Care Plan  No Known Allergies  Medications Reviewed Today    Reviewed by Vella Raring, RPH-CPP (Pharmacist) on 06/22/20 at 1632  Med List Status: <None>  Medication Order Taking? Sig Documenting Provider Last Dose Status Informant  amLODipine (NORVASC) 5 MG tablet [id number] Yes Take 1 tablet (5 mg total) by mouth daily. [name], FNP Taking Active   aspirin EC 81 MG tablet [id number]  Take 81 mg by mouth.  [provider]  Active Self  atorvastatin (LIPITOR) 20 MG tablet [id number] Yes Take 1 tablet (20 mg total) by mouth daily. [name], FNP Taking Active   blood glucose meter kit and supplies KIT [id number]  Dispense based on patient and insurance preference. Use up to four times daily as directed. 100 strips and lancets please [name], NP  Active   Blood Glucose Monitoring Suppl (CONTOUR NEXT EZ) w/Device KIT [id number]  USE UP TO FOUR TIMES DAILY AS DIRECTED. (FOR ICD-10 E10.9, E11.9). [name], DO  Active   empagliflozin (JARDIANCE) 10 MG TABS tablet [id number] Yes Take 1 tablet (10 mg total) by  mouth daily before breakfast. Malfi, Lupita Raider, FNP Taking Active   hydrochlorothiazide (HYDRODIURIL) 25 MG tablet [id number] Yes Take 1 tablet (25 mg total) by mouth daily. Malfi, Lupita Raider, FNP Taking Active   insulin glargine (LANTUS SOLOSTAR) 100 UNIT/ML Solostar Pen [id number]  Inject 20 Units into the skin daily.  Patient taking differently: Inject 24 Units into the skin daily.   [name], NP  Active   Insulin Pen Needle 32G X 4 MM MISC [id number]  1 Units by Does not apply route every morning. Pen needles [name], NP  Active   lisinopril (ZESTRIL) 20 MG  tablet [id number] Yes Take 1 tablet (20 mg total) by mouth daily. Malfi, Lupita Raider, FNP Taking Active   metFORMIN (GLUCOPHAGE) 1000 MG tablet [id number] Yes Take 1 tablet (1,000 mg total) by mouth 2 (two) times daily with a meal. [name], NP Taking Active   tadalafil (CIALIS) 5 MG tablet [id number]  Take 1 tablet (5 mg total) by mouth daily as needed for erectile dysfunction. [name], FNP  Active           Patient Active Problem List   Diagnosis Date Noted  . COPD (chronic obstructive pulmonary disease) ([place]) 06/14/2020  . Hyperlipidemia 03/23/2020  . Erectile dysfunction 03/22/2020  . Toenail fungus 07/17/2019  . Type 2 diabetes mellitus ([place]) 08/23/2017  . Gastroesophageal reflux disease 12/07/2014  . Essential hypertension 12/07/2014  . Rectal cancer ([place]) 12/07/2014  . Tobacco abuse 12/07/2014  . Tubular adenoma polyp of rectum 11/08/2014    Conditions to be addressed/monitored: HTN, HLD and DMII  Care Plan : PharmD Medication Management  Updates made by Vella Raring, RPH-CPP since 07/04/2020 12:00 AM    Problem: Disease Progression     Long-Range Goal: Disease Progression Prevented or Minimized   Start Date: 06/06/2020  Expected End Date: 09/04/2020  This Visit's Progress: On track  Recent Progress: On track  Priority: High  Note:   Current Barriers:  . Financial barriers . Lack of blood pressure results for clinical team  Pharmacist Clinical Goal(s):  Marland Kitchen Over the next 90 days, patient will achieve control of blood sugar as evidenced by A1C <7%  through collaboration with PharmD and provider.   Interventions: . 1:1 collaboration with [name], DO regarding development and update of comprehensive plan of care as evidenced by provider attestation and co-signature . Inter-disciplinary care team collaboration (see longitudinal plan of care)  T2DM: . Uncontrolled; current treatment: o Jardiance 10 mg daily before breakfast o Metformin  1000 mg twice daily with meals o Lantus 20 units daily in evening . Report recent home blood sugar results: o Today: 117 (before supper) o Denies checking morning fasting blood sugar, usually checking in afternoon or evening - Recalls readings over past week ranging 117-157 . Again counsel on rational for/encourage patient to start checking morning fasting readings o Reports he received the blood sugar log in the mail and will now start checking fasting blood sugar and keeping log of results. Advise patient to bring this record with him to upcoming appointment . Patient denies s/s of hypoglycemia . Counsel on importance of eating regular and well-balanced meals throughout the day . Reports has noticed his energy and eye sight are both improved since blood sugar under better control . Again encouraged patient to schedule appointment for annual eye exam  Hypertension: . Uncontrolled; current treatment: o HCTZ 25 mg daily o Lisinopril 20 mg daily o Amlodipine 5 mg daily . Denies  checking blood pressure recently o Reports will ask nurse at work to check his BP . Denies signs of hypotension or hypertension . Encourage patient to monitor BP and bring log of results to upcoming medical appointment  Hyperlipidemia: . Uncontrolled; current treatment: o Atorvastatin 20 mg daily - Note patient previously out of Rx, but refilled on 3/23 and reports now taking consistently  Medication Assistance: . Confirms Jardiance and Lantus prescriptions now affordable to him through co-pay savings cards from manufacturer  Medication Adherence: . Note his wife helps to manage his medications including his Lantus nightly . Reports obtained and started using weekly pillboxes as adherence aid. Reports his wife fills pillbox for him  Patient Goals/Self-Care Activities . Over the next 90 days, patient will:  - check glucose, document, and provide at future appointments - check blood pressure, document, and  provide at future appointments  Follow Up Plan: Telephone follow up appointment with care management team member scheduled for: 5/9 at 4:30 pm      Follow Up:  Patient agrees to Care Plan and Follow-up.  Plan: Telephone follow up appointment with care management team member scheduled for:  5/9 at 4:30 pm  Harlow Asa, PharmD, [place], [place] [phone]

## 2020-07-04 NOTE — Patient Instructions (Signed)
Visit Information  PATIENT GOALS: Goals Addressed            This Visit's Progress   . Pharmacy Goals       Our goal A1c is less than 7%. This corresponds with fasting sugars less than 130 and 2 hour after meal sugars less than 180. Please check your fasting blood sugar and keep a log of the results  Our goal bad cholesterol, or LDL, is less than 70 . This is why it is important to continue taking your atorvastatin  Our goal blood pressure is less than 130/80. Please check your blood pressure and keep log of the results.   Feel free to call me with any questions or concerns. I look forward to our next call!   Harlow Asa, PharmD, [place] [phone]       The patient verbalized understanding of instructions, educational materials, and care plan provided today and declined offer to receive copy of patient instructions, educational materials, and care plan.   Telephone follow up appointment with care management team member scheduled for: 5/9 at 4:30 pm  Harlow Asa, PharmD, [place], [place] [phone]

## 2020-07-05 NOTE — Telephone Encounter (Signed)
Please reschedule with RN CM at [facility]

## 2020-07-05 NOTE — Telephone Encounter (Signed)
Patient rescheduled for 08/08/2020

## 2020-07-12 ENCOUNTER — Ambulatory Visit: Payer: BC Managed Care – PPO | Admitting: Unknown Physician Specialty

## 2020-07-12 ENCOUNTER — Encounter: Payer: Self-pay | Admitting: Unknown Physician Specialty

## 2020-07-12 ENCOUNTER — Other Ambulatory Visit: Payer: Self-pay

## 2020-07-12 VITALS — BP 129/60 | HR 72 | Temp 97.5°F | Ht 68.0 in | Wt 187.2 lb

## 2020-07-12 DIAGNOSIS — E785 Hyperlipidemia, unspecified: Secondary | ICD-10-CM

## 2020-07-12 DIAGNOSIS — J42 Unspecified chronic bronchitis: Secondary | ICD-10-CM

## 2020-07-12 DIAGNOSIS — F32A Depression, unspecified: Secondary | ICD-10-CM | POA: Diagnosis not present

## 2020-07-12 DIAGNOSIS — E1165 Type 2 diabetes mellitus with hyperglycemia: Secondary | ICD-10-CM

## 2020-07-12 DIAGNOSIS — I10 Essential (primary) hypertension: Secondary | ICD-10-CM | POA: Diagnosis not present

## 2020-07-12 DIAGNOSIS — Z720 Tobacco use: Secondary | ICD-10-CM

## 2020-07-12 NOTE — Progress Notes (Signed)
BP 129/60 (BP Location: Right Arm, Patient Position: Sitting, Cuff Size: Normal)   Pulse 72   Temp (!) 97.5 F (36.4 C) (Temporal)   Ht 5\' 8"  (1.727 m)   Wt 187 lb 3.2 oz (84.9 kg)   SpO2 99%   BMI 28.46 kg/m    Subjective:    Patient ID: Anthony Bell, male    DOB: 1955/08/31, 65 y.o.   MRN: [id number]  HPI: Anthony Bell is a 65 y.o. male  Chief Complaint  Patient presents with  . COPD    Pt states medication that was prescribed for the coughing has completely cleared out the coughing.  . Diabetes   Diabetes: Using medications without difficulties.  Taking 20 u of insulin No hypoglycemic episodes No hyperglycemic episodes Feet problems:no Blood Sugars averaging: 96-120.   eye exam within last year:  No, but is trying to get scheduled.  Blurred vision is much improved since sugar has been under control Last Hgb A1C: 14 one month ago  Hypertension  Using medications without difficulty Average home BPs   Using medication without problems or lightheadedness No chest pain with exertion or shortness of breath No Edema  Elevated Cholesterol Using medications without problems No Muscle aches  Diet: Watches what he eats.  Rarely eats breakfast and just a little for lunch.  Cut out sodas.   Exercise:Physical job  COPD Much improved.  Takes an inhaler daily.  This has cleared up his cough.     1 month ago depression screen was elevated.  Today he has none of those symptoms Depression screen [facility] 2/9 07/12/2020 06/06/2020 06/02/2020 01/15/2019 04/23/2017  Decreased Interest 0 1 1 0 0  Down, Depressed, Hopeless 0 1 1 0 0  PHQ - 2 Score 0 2 2 0 0  Altered sleeping 0 2 2 0 -  Tired, decreased energy 0 0 - 0 -  Change in appetite 0 3 3 0 -  Feeling bad or failure about yourself  0 3 3 0 -  Trouble concentrating 0 0 0 0 -  Moving slowly or fidgety/restless 0 0 0 0 -  Suicidal thoughts 0 0 0 0 -  PHQ-9 Score 0 10 10 0 -  Difficult doing work/chores - Not difficult at all  Not difficult at all - -      Relevant past medical, surgical, family and social history reviewed and updated as indicated. Interim medical history since our last visit reviewed. Allergies and medications reviewed and updated.  Review of Systems  Per HPI unless specifically indicated above     Objective:    BP 129/60 (BP Location: Right Arm, Patient Position: Sitting, Cuff Size: Normal)   Pulse 72   Temp (!) 97.5 F (36.4 C) (Temporal)   Ht 5\' 8"  (1.727 m)   Wt 187 lb 3.2 oz (84.9 kg)   SpO2 99%   BMI 28.46 kg/m   Wt Readings from Last 3 Encounters:  07/12/20 187 lb 3.2 oz (84.9 kg)  06/14/20 186 lb 6.4 oz (84.6 kg)  06/07/20 180 lb 9.6 oz (81.9 kg)    Physical Exam Constitutional:      General: He is not in acute distress.    Appearance: Normal appearance. He is well-developed.  HENT:     Head: Normocephalic and atraumatic.  Eyes:     General: Lids are normal. No scleral icterus.       Right eye: No discharge.        Left eye: No discharge.  Conjunctiva/sclera: Conjunctivae normal.  Neck:     Vascular: No carotid bruit or JVD.  Cardiovascular:     Rate and Rhythm: Normal rate and regular rhythm.     Heart sounds: Normal heart sounds.  Pulmonary:     Effort: Pulmonary effort is normal. No respiratory distress.     Breath sounds: Normal breath sounds.  Abdominal:     Palpations: There is no hepatomegaly or splenomegaly.  Musculoskeletal:        General: Normal range of motion.     Cervical back: Normal range of motion and neck supple.  Skin:    General: Skin is warm and dry.     Coloration: Skin is not pale.     Findings: No rash.  Neurological:     Mental Status: He is alert and oriented to person, place, and time.  Psychiatric:        Behavior: Behavior normal.        Thought Content: Thought content normal.        Judgment: Judgment normal.     Results for orders placed or performed in visit on 06/07/20  POCT Urinalysis Dipstick  Result Value  Ref Range   Color, UA Yellow    Clarity, UA clear    Glucose, UA Positive (A) Negative   Bilirubin, UA negative    Ketones, UA negative    Spec Grav, UA 1.010 1.010 - 1.025   Blood, UA negative    pH, UA 5.0 5.0 - 8.0   Protein, UA Negative Negative   Urobilinogen, UA 0.2 0.2 or 1.0 E.U./dL   Nitrite, UA negative    Leukocytes, UA Negative Negative   Appearance     Odor        Assessment & Plan:   Problem List Items Addressed This Visit      Unprioritized   COPD (chronic obstructive pulmonary disease) ([place])    Doing well on Albuterol prn      Essential hypertension    Stable, continue present medications.        Hyperlipidemia    Stable, continue present medications.        Tobacco abuse    Not prepared to quit at this time      Type 2 diabetes mellitus ([place])    Taking Lantus 20 units with Jardiance and Metformin.  Doing well with BS at goal.  However, doesn't eat in the AM and hat high risk for hypoglycemia.  Pt aware to let us know of any hypoglycemia signs.         Other Visit Diagnoses    Depression, unspecified depression type    -  Primary   resolved. with PHQ 9 0       Follow up plan: Return in about 2 months (around 09/11/2020). for Hgb A1C and other labs

## 2020-07-12 NOTE — Assessment & Plan Note (Signed)
Doing well on Albuterol prn

## 2020-07-12 NOTE — Assessment & Plan Note (Signed)
Taking Lantus 20 units with Jardiance and Metformin.  Doing well with BS at goal.  However, doesn't eat in the AM and hat high risk for hypoglycemia.  Pt aware to let us know of any hypoglycemia signs.

## 2020-07-12 NOTE — Assessment & Plan Note (Signed)
Stable, continue present medications.   

## 2020-07-12 NOTE — Assessment & Plan Note (Signed)
Not prepared to quit at this time

## 2020-08-01 ENCOUNTER — Ambulatory Visit: Payer: Self-pay | Admitting: Pharmacist

## 2020-08-01 DIAGNOSIS — E785 Hyperlipidemia, unspecified: Secondary | ICD-10-CM

## 2020-08-01 DIAGNOSIS — I10 Essential (primary) hypertension: Secondary | ICD-10-CM

## 2020-08-01 DIAGNOSIS — E1165 Type 2 diabetes mellitus with hyperglycemia: Secondary | ICD-10-CM

## 2020-08-01 NOTE — Chronic Care Management (AMB) (Signed)
Care Management   Pharmacy Note  08/01/2020 Name: Anthony Bell MRN: [id number] DOB: 12-Jan-1956  Subjective: Anthony Bell is a 65 y.o. year old male who is a primary care patient of Lorine Bears, Lupita Raider, FNP. The Care Management team was consulted for assistance with care management and care coordination needs.    Engaged with patient by telephone for follow up visit in response to provider referral for pharmacy case management and/or care coordination services.   The patient was given information about Care Management services today including:  1. Care Management services includes personalized support from designated clinical staff supervised by the patient's primary care provider, including individualized plan of care and coordination with other care providers. 2. 24/7 contact phone numbers for assistance for urgent and routine care needs. 3. The patient may stop case management services at any time by phone call to the office staff.  Patient agreed to services and consent obtained.  Assessment:  Review of patient status, including review of consultants reports, laboratory and other test data, was performed as part of comprehensive evaluation and provision of chronic care management services.   SDOH (Social Determinants of Health) assessments and interventions performed:  yes  Objective:  Lab Results  Component Value Date   CREATININE 1.21 06/02/2020   CREATININE 1.47 (H) 03/22/2020   CREATININE 1.55 (H) 02/02/2018    Lab Results  Component Value Date   HGBA1C 14.0 (A) 06/02/2020       Component Value Date/Time   CHOL 235 (H) 03/22/2020 0833   TRIG 242 (H) 03/22/2020 0833   HDL 36 (L) 03/22/2020 0833   CHOLHDL 6.5 (H) 03/22/2020 0833   LDLCALC 158 (H) 03/22/2020 0833    Clinical ASCVD: No  The 10-year ASCVD risk score Mikey Bussing DC Jr., et al., 2013) is: 46.8%   Values used to calculate the score:     Age: 56 years     Sex: Male     Is Non-Hispanic African American: Yes      Diabetic: Yes     Tobacco smoker: Yes     Systolic Blood Pressure: 025 mmHg     Is BP treated: Yes     HDL Cholesterol: 36 mg/dL     Total Cholesterol: 235 mg/dL     BP Readings from Last 3 Encounters:  07/12/20 129/60  06/14/20 (!) 158/63  06/07/20 (!) 162/60    Care Plan  Not on File  Medications Reviewed Today    Reviewed by Vella Raring, RPH-CPP (Pharmacist) on 08/01/20 at [place] List Status: <None>  Medication Order Taking? Sig Documenting Provider Last Dose Status Informant  amLODipine (NORVASC) 5 MG tablet [id number] Yes Take 1 tablet (5 mg total) by mouth daily. [name], FNP Taking Active   aspirin EC 81 MG tablet [id number] Yes Take 81 mg by mouth.  [provider] Taking Active Self  atorvastatin (LIPITOR) 20 MG tablet [id number] Yes Take 1 tablet (20 mg total) by mouth daily. [name], FNP Taking Active   blood glucose meter kit and supplies KIT [id number]  Dispense based on patient and insurance preference. Use up to four times daily as directed. 100 strips and lancets please [name], NP  Active   Blood Glucose Monitoring Suppl (CONTOUR NEXT EZ) w/Device KIT [id number]  USE UP TO FOUR TIMES DAILY AS DIRECTED. (FOR ICD-10 E10.9, E11.9). [name], DO  Active   CONTOUR NEXT TEST test strip [id number]  USE AS DIRECTED UPTO 4 TIMES A DAY  [name], NP  Active   empagliflozin (JARDIANCE) 10 MG TABS tablet [id number] Yes Take 1 tablet (10 mg total) by mouth daily before breakfast. Lorine Bears Lupita Raider, FNP Taking Active   hydrochlorothiazide (HYDRODIURIL) 25 MG tablet [id number] Yes Take 1 tablet (25 mg total) by mouth daily. Malfi, Lupita Raider, FNP Taking Active   insulin glargine (LANTUS SOLOSTAR) 100 UNIT/ML Solostar Pen [id number] Yes Inject 20 Units into the skin daily. [name], NP Taking Active   Insulin Pen Needle 32G X 4 MM MISC [id number]  1 Units by Does not apply route every morning. Pen needles [name], NP   Active   lisinopril (ZESTRIL) 20 MG tablet [id number] Yes Take 1 tablet (20 mg total) by mouth daily. Malfi, Lupita Raider, FNP Taking Active   metFORMIN (GLUCOPHAGE) 1000 MG tablet [id number] Yes Take 1 tablet (1,000 mg total) by mouth 2 (two) times daily with a meal. [name], NP Taking Active   tadalafil (CIALIS) 5 MG tablet [id number]  Take 1 tablet (5 mg total) by mouth daily as needed for erectile dysfunction. [name], FNP  Active           Patient Active Problem List   Diagnosis Date Noted  . COPD (chronic obstructive pulmonary disease) ([place]) 06/14/2020  . Hyperlipidemia 03/23/2020  . Erectile dysfunction 03/22/2020  . Toenail fungus 07/17/2019  . Type 2 diabetes mellitus ([place]) 08/23/2017  . Gastroesophageal reflux disease 12/07/2014  . Essential hypertension 12/07/2014  . Rectal cancer ([place]) 12/07/2014  . Tobacco abuse 12/07/2014  . Tubular adenoma polyp of rectum 11/08/2014    Conditions to be addressed/monitored: HTN, HLD and DMII  Care Plan : PharmD Medication Management  Updates made by Vella Raring, RPH-CPP since 08/01/2020 12:00 AM    Problem: Disease Progression     Long-Range Goal: Disease Progression Prevented or Minimized   Start Date: 06/06/2020  Expected End Date: 09/04/2020  This Visit's Progress: On track  Recent Progress: On track  Priority: High  Note:   Current Barriers:  . Financial barriers o Using copayment card from manufacturers for Jardiance and Lantus . Lack of blood pressure results for clinical team  Pharmacist Clinical Goal(s):  Marland Kitchen Over the next 90 days, patient will achieve control of blood sugar as evidenced by A1C <7%  through collaboration with PharmD and provider.   Interventions: . 1:1 collaboration with [name], DO regarding development and update of comprehensive plan of care as evidenced by provider attestation and co-signature . Inter-disciplinary care team collaboration (see longitudinal plan of  care) . Perform chart review. Patient seen for Office Visit with NP [name] on 4/19 . Comprehensive medication review performed; medication list updated in electronic medical record . Today patient reports that he picked up and completed course of azithromycin as prescribed by [name], NP. Reports symptoms are improved, but still has a runny nose  T2DM: . Uncontrolled; current treatment: o Jardiance 10 mg daily before breakfast o Metformin 1000 mg twice daily with meals o Lantus 20 units daily in evening . Report recent home fasting blood sugar results ranging 108-121 . Patient denies s/s of hypoglycemia o Have discussed s/s of low blood sugar and how to manage lows . Counsel on importance of eating regular and well-balanced meals throughout the day. Reports is working on eating breakfast once he gets to work . Reports currently walks daily ~10 minutes (around yard and to mailbox) x 7 days/week . Again encouraged patient to schedule appointment for annual  eye exam  Hypertension: . Current treatment: o HCTZ 25 mg daily o Lisinopril 20 mg daily o Amlodipine 5 mg daily . Denies checking blood pressure recently o Reports will ask nurse at work to check his BP . Denies signs of hypotension or hypertension . Encourage patient to monitor BP and bring log of results to upcoming medical appointment  Hyperlipidemia: . Uncontrolled; current treatment: o Atorvastatin 20 mg daily - Note patient previously out of Rx, but refilled on 3/23 and reports now taking consistently  Medication Adherence: . Note his wife helps to manage his medications including his Lantus nightly . Using weekly pillboxes as adherence aid. Reports filling pillbox himself but wife also assists with medication management  Tobacco Abuse: . Reports currently smokes ~1 ppd . Discuss benefits of smoking cessation . Reports not ready to quit at this time   Patient Goals/Self-Care Activities . Over the next 90  days, patient will:  - check glucose, document, and provide at future appointments - check blood pressure, document, and provide at future appointments - attend medical appointments as scheduled  Appointment with PCP NP Webb Silversmith on 6/21  Follow Up Plan: Telephone follow up appointment with care management team member scheduled for: 6/8 at 4:30 pm      Follow Up:  Patient agrees to Care Plan and Follow-up.  Harlow Asa, PharmD, [place], CPP Clinical Pharmacist [facility] [phone]

## 2020-08-01 NOTE — Patient Instructions (Signed)
Visit Information  PATIENT GOALS: Goals Addressed            This Visit's Progress   . Pharmacy Goals       Our goal A1c is less than 7%. This corresponds with fasting sugars less than 130 and 2 hour after meal sugars less than 180. Please check your fasting blood sugar and keep a log of the results  Our goal bad cholesterol, or LDL, is less than 70 . This is why it is important to continue taking your atorvastatin  Our goal blood pressure is less than 130/80. Please check your blood pressure and keep log of the results.  Feel free to call me with any questions or concerns. I look forward to our next call!   Harlow Asa, PharmD, [place] [phone]       The patient verbalized understanding of instructions, educational materials, and care plan provided today and declined offer to receive copy of patient instructions, educational materials, and care plan.   Telephone follow up appointment with care management team member scheduled for: 6/8 at 4:30 pm

## 2020-08-08 ENCOUNTER — Telehealth: Payer: BC Managed Care – PPO | Admitting: General Practice

## 2020-08-08 ENCOUNTER — Ambulatory Visit: Payer: Self-pay | Admitting: General Practice

## 2020-08-08 ENCOUNTER — Telehealth: Payer: Self-pay | Admitting: General Practice

## 2020-08-08 DIAGNOSIS — I10 Essential (primary) hypertension: Secondary | ICD-10-CM

## 2020-08-08 DIAGNOSIS — E1165 Type 2 diabetes mellitus with hyperglycemia: Secondary | ICD-10-CM

## 2020-08-08 NOTE — Patient Instructions (Signed)
Visit Information  Goals Addressed            This Visit's Progress   . RNCM: Monitor and Manage My Blood Sugar-Diabetes Type 2       Timeframe:  Short-Term Goal Priority:  High Start Date:       06-06-2020                      Expected End Date:      11-22-2020                 Follow Up Date 10-17-2020   - check blood sugar at prescribed times - check blood sugar before and after exercise - check blood sugar if I feel it is too high or too low - enter blood sugar readings and medication or insulin into daily log - take the blood sugar log to all doctor visits - take the blood sugar meter to all doctor visits    Why is this important?    Checking your blood sugar at home helps to keep it from getting very high or very low.   Writing the results in a diary or log helps the doctor know how to care for you.   Your blood sugar log should have the time, date and the results.   Also, write down the amount of insulin or other medicine that you take.   Other information, like what you ate, exercise done and how you were feeling, will also be helpful.     Notes: Encouraged the patient to start taking blood sugars regularly. 08-08-2020: The patient is taking more regularly now.       Patient Care Plan: RNCM: Diabetes Type 2 (Adult)    Problem Identified: RNCM: Glycemic Management (Diabetes, Type 2)   Priority: Medium    Long-Range Goal: RNCM: Glycemic Management Optimized   Priority: High  Note:   Objective:  Lab Results  Component Value Date   HGBA1C 14.0 (A) 06/02/2020 .   Lab Results  Component Value Date   CREATININE 1.21 06/02/2020   CREATININE 1.47 (H) 03/22/2020   CREATININE 1.55 (H) 02/02/2018 .   Marland Kitchen No results found for: EGFR Current Barriers:  Marland Kitchen Knowledge Deficits related to basic Diabetes pathophysiology and self care/management . Knowledge Deficits related to medications used for management of diabetes . Does not have glucometer to monitor blood sugar- meter  ordered on 06-02-2020 . Does not use cbg meter  . Limited Social Support . Unable to independently manage DM . Lacks social connections . Does not maintain contact with provider office . Does not contact provider office for questions/concerns Case Manager Clinical Goal(s):  . patient will demonstrate improved adherence to prescribed treatment plan for diabetes self care/management as evidenced by: daily monitoring and recording of CBG  adherence to ADA/ carb modified diet adherence to prescribed medication regimen contacting provider for new or worsened symptoms or questions Interventions:  . Collaboration with Malfi, Lupita Raider, FNP regarding development and update of comprehensive plan of care as evidenced by provider attestation and co-signature . Inter-disciplinary care team collaboration (see longitudinal plan of care) . Provided education to patient about basic DM disease process. Education on checking blood sugars. The patient states his sister is supposed to bring him a meter. Co-collaboration with the pharmacist. Meter was ordered on 06-02-2020 by pcp. Pharm D will assist with meter use and medications management. Spoke with the patient about the importance of checking blood sugars and fasting blood sugar <  130 and post prandial <180.  Also discussed goal of hemoglobin A1C of 7.0 or less. The patient verbalized understanding. The patient drinks Pepsi but has switched to water. Talked about no sugar options for drinks. The patient does not like diet pepsi. Offered other options for the patient to try. Expressed the benefits of controlled DM on organ systems. The patient is receptive to learning. 08-08-2020: The patient states he is doing better at managing his DM. Denies any lows.  . Reviewed medications with patient and discussed importance of medication adherence. 08-08-2020: The patient is compliant with medications regimen . Discussed plans with patient for ongoing care management follow up and  provided patient with direct contact information for care management team . Provided patient with written educational materials related to hypo and hyperglycemia and importance of correct treatment. Reviewed with the patient the sx and sx and what to look for in changes. 08-08-2020: The patient states he is not having any low blood sugars. Review of sx and sx to look for. Will continue to monitor.  . Reviewed scheduled/upcoming provider appointments including: 09-13-2020 . Advised patient, providing education and rationale, to check cbg BID and record, calling pcp for findings outside established parameters.  08-08-2020: The patient states he is doing well. His blood sugar this am was 129. He will have new blood work in June. Expects A1C to be trending down.  Marland Kitchen Referral made to pharmacy team for assistance with medication education, management, and help with the importance of taking blood sugars daily and managing blood sugars with medications and dietary constraints  . Review of patient status, including review of consultants reports, relevant laboratory and other test results, and medications completed. Self-Care Activities - UNABLE to independently manage DM as evidence of hemoglobin A1C of 14 Self administers oral medications as prescribed Attends all scheduled provider appointments Checks blood sugars as prescribed and utilize hyper and hypoglycemia protocol as needed Adheres to prescribed ADA/carb modified Patient Goals: - check blood sugar at prescribed times - check blood sugar before and after exercise - check blood sugar if I feel it is too high or too low - enter blood sugar readings and medication or insulin into daily log - take the blood sugar log to all doctor visits - take the blood sugar meter to all doctor visits - change to whole grain breads, cereal, pasta - drink 6 to 8 glasses of water each day - fill half of plate with vegetables - limit fast food meals to no more than 1 per  week - manage portion size - prepare main meal at home 3 to 5 days each week - read food labels for fat, fiber, carbohydrates and portion size - switch to low-fat or skim milk - switch to sugar-free drinks - schedule appointment with eye doctor - check feet daily for cuts, sores or redness - do heel pump exercise 2 to 3 times each day - keep feet up while sitting - trim toenails straight across - wash and dry feet carefully every day - wear comfortable, cotton socks - wear comfortable, well-fitting shoes barriers to adherence to treatment plan identified - blood glucose monitoring encouraged - blood glucose readings reviewed - counseling by pharmacist provided- pharm D-co collaboration. Pharm D will contact the patient for help with effective management of DM and other chronic conditions  - individualized medical nutrition therapy provided - mutual A1C goal set or reviewed - resources required to improve adherence to care identified - self-awareness of signs/symptoms of hypo  or hyperglycemia encouraged - use of blood glucose monitoring log promoted- to send log by mail to the patient.    Follow Up Plan: Telephone follow up appointment with care management team member scheduled for: 10-17-2020 at 345 pm   Task: RNCM: Alleviate Barriers to Glycemic Management   Note:   Care Management Activities:    - barriers to adherence to treatment plan identified - blood glucose monitoring encouraged - blood glucose readings reviewed - counseling by pharmacist provided- pharm D-co collaboration. Pharm D will contact the patient for help with effective management of DM and other chronic conditions  - individualized medical nutrition therapy provided - mutual A1C goal set or reviewed - resources required to improve adherence to care identified - self-awareness of signs/symptoms of hypo or hyperglycemia encouraged - use of blood glucose monitoring log promoted- to send log by mail to the patient.        Patient Care Plan: RNCM: Hypertension (Adult)    Problem Identified: RNCM: Hypertension (Hypertension)   Priority: Medium    Long-Range Goal: RNCM: Hypertension Monitored   Priority: Medium  Note:   Objective:  . Last practice recorded BP readings:  . BP Readings from Last 3 Encounters: .  07/12/20 . 129/60 .  06/14/20 . (!) 158/63 .  06/07/20 . (!) 162/60 .    Marland Kitchen Most recent eGFR/CrCl: No results found for: EGFR  No components found for: CRCL Current Barriers:  Marland Kitchen Knowledge Deficits related to basic understanding of hypertension pathophysiology and self care management . Knowledge Deficits related to understanding of medications prescribed for management of hypertension . Limited Social Support . Lacks social connections . Does not maintain contact with provider office . Does not contact provider office for questions/concerns Case Manager Clinical Goal(s):  Marland Kitchen Over the next 120 days, patient will verbalize understanding of plan for hypertension management . Over the next 120 days, patient will attend all scheduled medical appointments: 06-07-2020 . Over the next 120 days, patient will demonstrate improved adherence to prescribed treatment plan for hypertension as evidenced by taking all medications as prescribed, monitoring and recording blood pressure as directed, adhering to low sodium/DASH diet . Over the next 120 days, patient will demonstrate improved health management independence as evidenced by checking blood pressure as directed and notifying PCP if SBP>150 or DBP > 90, taking all medications as prescribe, and adhering to a low sodium diet as discussed. . Over the next 120 days, patient will verbalize basic understanding of hypertension disease process and self health management plan as evidenced by compliance with medications, compliance with heart healthy/ADA diet and working with CCM team to optimize health and well being  Interventions:  . Collaboration with Malfi,  Lupita Raider, FNP regarding development and update of comprehensive plan of care as evidenced by provider attestation and co-signature . Inter-disciplinary care team collaboration (see longitudinal plan of care) . Evaluation of current treatment plan related to hypertension self management and patient's adherence to plan as established by provider. 08-08-2020: The patient is doing well and denies any new concerns with blood pressure, is eating well and getting rest.  . Provided education to patient re: stroke prevention, s/s of heart attack and stroke, DASH diet, complications of uncontrolled blood pressure . Reviewed medications with patient and discussed importance of compliance. 08-08-2020: Is compliant with medications regimen  . Discussed plans with patient for ongoing care management follow up and provided patient with direct contact information for care management team . Advised patient, providing education and rationale,  to monitor blood pressure daily and record, calling PCP for findings outside established parameters.  . Reviewed scheduled/upcoming provider appointments including: 09-13-2020 Self-Care Activities: - Self administers medications as prescribed Attends all scheduled provider appointments Calls provider office for new concerns, questions, or BP outside discussed parameters Checks BP and records as discussed Follows a low sodium diet/DASH diet Patient Goals: - check blood pressure weekly - choose a place to take my blood pressure (home, clinic or office, retail store) - write blood pressure results in a log or diary - agree on reward when goals are met - agree to work together to make changes - ask questions to understand - have a family meeting to talk about healthy habits - learn about high blood pressure - blood pressure trends reviewed - depression screen reviewed - home or ambulatory blood pressure monitoring encouraged Follow Up Plan: Telephone follow up appointment with  care management team member scheduled for: 10-17-2020 at 345 pm   Task: RNCM: Identify and Monitor Blood Pressure Elevation   Note:   Care Management Activities:    - blood pressure trends reviewed - depression screen reviewed - home or ambulatory blood pressure monitoring encouraged       Patient Care Plan: RNCM: HLD Management    Problem Identified: RNCM: Management of HLD   Priority: Medium    Long-Range Goal: RNCM: Management of HLD   Priority: Medium  Note:   Current Barriers:  . Poorly controlled hyperlipidemia, complicated by uncontrolled DM . Current antihyperlipidemic regimen: Lipitor 20 Mg QD . Most recent lipid panel:     Component Value Date/Time   CHOL 235 (H) 03/22/2020 0833   TRIG 242 (H) 03/22/2020 0833   HDL 36 (L) 03/22/2020 0833   CHOLHDL 6.5 (H) 03/22/2020 0833   LDLCALC 158 (H) 03/22/2020 1694 .   Marland Kitchen ASCVD risk enhancing conditions: age 65,  DM, HTN, current smoker . Unable to independently manage chronic conditons  . Lacks social connections . Does not maintain contact with provider office . Does not contact provider office for questions/concerns RN Care Manager Clinical Goal(s):  . patient will work with Consulting civil engineer, providers, and care team towards execution of optimized self-health management plan . patient will verbalize understanding of plan for effective management of HLD  . patient will work with [facility] [place] team  to address needs related to HLD  . patient will attend all scheduled medical appointments: 06-07-2020 Interventions: . Collaboration with Malfi, Lupita Raider, FNP regarding development and update of comprehensive plan of care as evidenced by provider attestation and co-signature . Inter-disciplinary care team collaboration (see longitudinal plan of care) . Medication review performed; medication list updated in electronic medical record.  Bertram Savin care team collaboration (see longitudinal plan of care) . Referred to pharmacy  team for assistance with HLD medication management . Evaluation of current treatment plan related to HLD  and patient's adherence to plan as established by provider. . Advised patient to call the office for changes or questions . Provided education to patient re: heart healthy/ADA diet. 08-08-2020: States compliance with heart healthy diet. Denies any new concerns at this time.  . Reviewed medications with patient and discussed compliance/cost constraints/questions. 08-08-2020: States that he is taking medications as prescribed  . Provided patient with dietary restriction  educational materials related to Heart healthy/ADA diet and the benefits of controlling chronic conditions to promote health and well being.  . Reviewed scheduled/upcoming provider appointments including: 09-13-2020  . Discussed plans with patient for ongoing  care management follow up and provided patient with direct contact information for care management team Patient Goals/Self-Care Activities: - call for medicine refill 2 or 3 days before it runs out - call if I am sick and can't take my medicine - keep a list of all the medicines I take; vitamins and herbals too - learn to read medicine labels - use a pillbox to sort medicine - use an alarm clock or phone to remind me to take my medicine - change to whole grain breads, cereal, pasta - drink 6 to 8 glasses of water each day - eat 3 to 5 servings of fruits and vegetables each day - eat 5 or 6 small meals each day - fill half the plate with nonstarchy vegetables - limit fast food meals to no more than 1 per week - manage portion size - prepare main meal at home 3 to 5 days each week - read food labels for fat, fiber, carbohydrates and portion size - switch to low-fat or skim milk - switch to sugar-free drinks - be open to making changes - I can manage, know and watch for signs of a heart attack - if I have chest pain, call for help - learn about small changes that will  make a big difference - learn my personal risk factors  Follow Up Plan: Telephone follow up appointment with care management team member scheduled for: 10-17-2020 at 345 pm     Patient Care Plan: PharmD Medication Management    Problem Identified: Disease Progression     Long-Range Goal: Disease Progression Prevented or Minimized   Start Date: 06/06/2020  Expected End Date: 09/04/2020  This Visit's Progress: On track  Recent Progress: On track  Priority: High  Note:   Current Barriers:  . Financial barriers o Using copayment card from manufacturers for Jardiance and Lantus . Lack of blood pressure results for clinical team  Pharmacist Clinical Goal(s):  Marland Kitchen Over the next 90 days, patient will achieve control of blood sugar as evidenced by A1C <7%  through collaboration with PharmD and provider.   Interventions: . 1:1 collaboration with [name], DO regarding development and update of comprehensive plan of care as evidenced by provider attestation and co-signature . Inter-disciplinary care team collaboration (see longitudinal plan of care) . Perform chart review. Patient seen for Office Visit with NP [name] on 4/19 . Comprehensive medication review performed; medication list updated in electronic medical record . Today patient reports that he picked up and completed course of azithromycin as prescribed by [name], NP. Reports symptoms are improved, but still has a runny nose  T2DM: . Uncontrolled; current treatment: o Jardiance 10 mg daily before breakfast o Metformin 1000 mg twice daily with meals o Lantus 20 units daily in evening . Report recent home fasting blood sugar results ranging 108-121 . Patient denies s/s of hypoglycemia o Have discussed s/s of low blood sugar and how to manage lows . Counsel on importance of eating regular and well-balanced meals throughout the day. Reports is working on eating breakfast once he gets to work . Reports  currently walks daily ~10 minutes (around yard and to mailbox) x 7 days/week . Again encouraged patient to schedule appointment for annual eye exam  Hypertension: . Current treatment: o HCTZ 25 mg daily o Lisinopril 20 mg daily o Amlodipine 5 mg daily . Denies checking blood pressure recently o Reports will ask nurse at work to check his BP . Denies signs of hypotension  or hypertension . Encourage patient to monitor BP and bring log of results to upcoming medical appointment  Hyperlipidemia: . Uncontrolled; current treatment: o Atorvastatin 20 mg daily - Note patient previously out of Rx, but refilled on 3/23 and reports now taking consistently  Medication Adherence: . Note his wife helps to manage his medications including his Lantus nightly . Using weekly pillboxes as adherence aid. Reports filling pillbox himself but wife also assists with medication management  Tobacco Abuse: . Reports currently smokes ~1 ppd . Discuss benefits of smoking cessation . Reports not ready to quit at this time   Patient Goals/Self-Care Activities . Over the next 90 days, patient will:  - check glucose, document, and provide at future appointments - check blood pressure, document, and provide at future appointments - attend medical appointments as scheduled  Appointment with PCP NP Webb Silversmith on 6/21  Follow Up Plan: Telephone follow up appointment with care management team member scheduled for: 6/8 at 4:30 pm      The patient verbalized understanding of instructions, educational materials, and care plan provided today and declined offer to receive copy of patient instructions, educational materials, and care plan.   Telephone follow up appointment with care management team member scheduled for: 10-17-2020 at 345 pm Noreene Larsson RN, MSN, [place] Mobile: [phone]

## 2020-08-08 NOTE — Telephone Encounter (Signed)
  Chronic Care Management   Note  08/08/2020 Name: Anthony Bell MRN: [id number] DOB: [birth year]  The patient returned call. Call completed.   Follow up plan: Telephone follow up appointment with care management team member scheduled for: 10-17-2020 345 pm  Noreene Larsson RN, MSN, [place] Mobile: [phone]

## 2020-08-08 NOTE — Chronic Care Management (AMB) (Signed)
Care Management    RN Visit Note  08/08/2020 Name: Anthony Bell MRN: [id number] DOB: April 26, 1955  Subjective: Anthony Bell is a 65 y.o. year old male who is a primary care patient of Lorine Bears, Lupita Raider, FNP. The care management team was consulted for assistance with disease management and care coordination needs.    Engaged with patient by telephone for follow up visit in response to provider referral for case management and/or care coordination services.   Consent to Services:   Mr. Rajewski was given information about Care Management services today including:  1. Care Management services includes personalized support from designated clinical staff supervised by his physician, including individualized plan of care and coordination with other care providers 2. 24/7 contact phone numbers for assistance for urgent and routine care needs. 3. The patient may stop case management services at any time by phone call to the office staff.  Patient agreed to services and consent obtained.   Assessment: Review of patient past medical history, allergies, medications, health status, including review of consultants reports, laboratory and other test data, was performed as part of comprehensive evaluation and provision of chronic care management services.   SDOH (Social Determinants of Health) assessments and interventions performed:    Care Plan  Not on File  Outpatient Encounter Medications as of 08/08/2020  Medication Sig  . amLODipine (NORVASC) 5 MG tablet Take 1 tablet (5 mg total) by mouth daily.  Marland Kitchen aspirin EC 81 MG tablet Take 81 mg by mouth.   Marland Kitchen atorvastatin (LIPITOR) 20 MG tablet Take 1 tablet (20 mg total) by mouth daily.  . blood glucose meter kit and supplies KIT Dispense based on patient and insurance preference. Use up to four times daily as directed. 100 strips and lancets please  . Blood Glucose Monitoring Suppl (CONTOUR NEXT EZ) w/Device KIT USE UP TO FOUR TIMES DAILY AS DIRECTED.  (FOR ICD-10 E10.9, E11.9).  . CONTOUR NEXT TEST test strip USE AS DIRECTED UPTO 4 TIMES A DAY  . empagliflozin (JARDIANCE) 10 MG TABS tablet Take 1 tablet (10 mg total) by mouth daily before breakfast.  . hydrochlorothiazide (HYDRODIURIL) 25 MG tablet Take 1 tablet (25 mg total) by mouth daily.  . insulin glargine (LANTUS SOLOSTAR) 100 UNIT/ML Solostar Pen Inject 20 Units into the skin daily.  . Insulin Pen Needle 32G X 4 MM MISC 1 Units by Does not apply route every morning. Pen needles  . lisinopril (ZESTRIL) 20 MG tablet Take 1 tablet (20 mg total) by mouth daily.  . metFORMIN (GLUCOPHAGE) 1000 MG tablet Take 1 tablet (1,000 mg total) by mouth 2 (two) times daily with a meal.  . tadalafil (CIALIS) 5 MG tablet Take 1 tablet (5 mg total) by mouth daily as needed for erectile dysfunction.   No facility-administered encounter medications on file as of 08/08/2020.    Patient Active Problem List   Diagnosis Date Noted  . COPD (chronic obstructive pulmonary disease) ([place]) 06/14/2020  . Hyperlipidemia 03/23/2020  . Erectile dysfunction 03/22/2020  . Toenail fungus 07/17/2019  . Type 2 diabetes mellitus ([place]) 08/23/2017  . Gastroesophageal reflux disease 12/07/2014  . Essential hypertension 12/07/2014  . Rectal cancer ([place]) 12/07/2014  . Tobacco abuse 12/07/2014  . Tubular adenoma polyp of rectum 11/08/2014    Conditions to be addressed/monitored: HTN, HLD and DMII  Care Plan : RNCM: Diabetes Type 2 (Adult)  Updates made by Vanita Ingles since 08/08/2020 12:00 AM    Problem: RNCM: Glycemic Management (Diabetes, Type 2)  Priority: Medium    Long-Range Goal: RNCM: Glycemic Management Optimized   Priority: High  Note:   Objective:  Lab Results  Component Value Date   HGBA1C 14.0 (A) 06/02/2020 .   Lab Results  Component Value Date   CREATININE 1.21 06/02/2020   CREATININE 1.47 (H) 03/22/2020   CREATININE 1.55 (H) 02/02/2018 .   Marland Kitchen No results found for: EGFR Current Barriers:   Marland Kitchen Knowledge Deficits related to basic Diabetes pathophysiology and self care/management . Knowledge Deficits related to medications used for management of diabetes . Does not have glucometer to monitor blood sugar- meter ordered on 06-02-2020 . Does not use cbg meter  . Limited Social Support . Unable to independently manage DM . Lacks social connections . Does not maintain contact with provider office . Does not contact provider office for questions/concerns Case Manager Clinical Goal(s):  . patient will demonstrate improved adherence to prescribed treatment plan for diabetes self care/management as evidenced by: daily monitoring and recording of CBG  adherence to ADA/ carb modified diet adherence to prescribed medication regimen contacting provider for new or worsened symptoms or questions Interventions:  . Collaboration with Malfi, Lupita Raider, FNP regarding development and update of comprehensive plan of care as evidenced by provider attestation and co-signature . Inter-disciplinary care team collaboration (see longitudinal plan of care) . Provided education to patient about basic DM disease process. Education on checking blood sugars. The patient states his sister is supposed to bring him a meter. Co-collaboration with the pharmacist. Meter was ordered on 06-02-2020 by pcp. Pharm D will assist with meter use and medications management. Spoke with the patient about the importance of checking blood sugars and fasting blood sugar <130 and post prandial <180.  Also discussed goal of hemoglobin A1C of 7.0 or less. The patient verbalized understanding. The patient drinks Pepsi but has switched to water. Talked about no sugar options for drinks. The patient does not like diet pepsi. Offered other options for the patient to try. Expressed the benefits of controlled DM on organ systems. The patient is receptive to learning. 08-08-2020: The patient states he is doing better at managing his DM. Denies any lows.   . Reviewed medications with patient and discussed importance of medication adherence. 08-08-2020: The patient is compliant with medications regimen . Discussed plans with patient for ongoing care management follow up and provided patient with direct contact information for care management team . Provided patient with written educational materials related to hypo and hyperglycemia and importance of correct treatment. Reviewed with the patient the sx and sx and what to look for in changes. 08-08-2020: The patient states he is not having any low blood sugars. Review of sx and sx to look for. Will continue to monitor.  . Reviewed scheduled/upcoming provider appointments including: 09-13-2020 . Advised patient, providing education and rationale, to check cbg BID and record, calling pcp for findings outside established parameters.  08-08-2020: The patient states he is doing well. His blood sugar this am was 129. He will have new blood work in June. Expects A1C to be trending down.  Marland Kitchen Referral made to pharmacy team for assistance with medication education, management, and help with the importance of taking blood sugars daily and managing blood sugars with medications and dietary constraints  . Review of patient status, including review of consultants reports, relevant laboratory and other test results, and medications completed. Self-Care Activities - UNABLE to independently manage DM as evidence of hemoglobin A1C of 14 Self administers oral medications  as prescribed Attends all scheduled provider appointments Checks blood sugars as prescribed and utilize hyper and hypoglycemia protocol as needed Adheres to prescribed ADA/carb modified Patient Goals: - check blood sugar at prescribed times - check blood sugar before and after exercise - check blood sugar if I feel it is too high or too low - enter blood sugar readings and medication or insulin into daily log - take the blood sugar log to all doctor  visits - take the blood sugar meter to all doctor visits - change to whole grain breads, cereal, pasta - drink 6 to 8 glasses of water each day - fill half of plate with vegetables - limit fast food meals to no more than 1 per week - manage portion size - prepare main meal at home 3 to 5 days each week - read food labels for fat, fiber, carbohydrates and portion size - switch to low-fat or skim milk - switch to sugar-free drinks - schedule appointment with eye doctor - check feet daily for cuts, sores or redness - do heel pump exercise 2 to 3 times each day - keep feet up while sitting - trim toenails straight across - wash and dry feet carefully every day - wear comfortable, cotton socks - wear comfortable, well-fitting shoes barriers to adherence to treatment plan identified - blood glucose monitoring encouraged - blood glucose readings reviewed - counseling by pharmacist provided- pharm D-co collaboration. Pharm D will contact the patient for help with effective management of DM and other chronic conditions  - individualized medical nutrition therapy provided - mutual A1C goal set or reviewed - resources required to improve adherence to care identified - self-awareness of signs/symptoms of hypo or hyperglycemia encouraged - use of blood glucose monitoring log promoted- to send log by mail to the patient.    Follow Up Plan: Telephone follow up appointment with care management team member scheduled for: 10-17-2020 at 345 pm   Care Plan : RNCM: Hypertension (Adult)  Updates made by Vanita Ingles since 08/08/2020 12:00 AM    Problem: RNCM: Hypertension (Hypertension)   Priority: Medium    Long-Range Goal: RNCM: Hypertension Monitored   Priority: Medium  Note:   Objective:  . Last practice recorded BP readings:  . BP Readings from Last 3 Encounters: .  07/12/20 . 129/60 .  06/14/20 . (!) 158/63 .  06/07/20 . (!) 162/60 .    Marland Kitchen Most recent eGFR/CrCl: No results found for:  EGFR  No components found for: CRCL Current Barriers:  Marland Kitchen Knowledge Deficits related to basic understanding of hypertension pathophysiology and self care management . Knowledge Deficits related to understanding of medications prescribed for management of hypertension . Limited Social Support . Lacks social connections . Does not maintain contact with provider office . Does not contact provider office for questions/concerns Case Manager Clinical Goal(s):  Marland Kitchen Over the next 120 days, patient will verbalize understanding of plan for hypertension management . Over the next 120 days, patient will attend all scheduled medical appointments: 06-07-2020 . Over the next 120 days, patient will demonstrate improved adherence to prescribed treatment plan for hypertension as evidenced by taking all medications as prescribed, monitoring and recording blood pressure as directed, adhering to low sodium/DASH diet . Over the next 120 days, patient will demonstrate improved health management independence as evidenced by checking blood pressure as directed and notifying PCP if SBP>150 or DBP > 90, taking all medications as prescribe, and adhering to a low sodium diet as discussed. Marland Kitchen  Over the next 120 days, patient will verbalize basic understanding of hypertension disease process and self health management plan as evidenced by compliance with medications, compliance with heart healthy/ADA diet and working with CCM team to optimize health and well being  Interventions:  . Collaboration with Malfi, Lupita Raider, FNP regarding development and update of comprehensive plan of care as evidenced by provider attestation and co-signature . Inter-disciplinary care team collaboration (see longitudinal plan of care) . Evaluation of current treatment plan related to hypertension self management and patient's adherence to plan as established by provider. 08-08-2020: The patient is doing well and denies any new concerns with blood pressure, is  eating well and getting rest.  . Provided education to patient re: stroke prevention, s/s of heart attack and stroke, DASH diet, complications of uncontrolled blood pressure . Reviewed medications with patient and discussed importance of compliance. 08-08-2020: Is compliant with medications regimen  . Discussed plans with patient for ongoing care management follow up and provided patient with direct contact information for care management team . Advised patient, providing education and rationale, to monitor blood pressure daily and record, calling PCP for findings outside established parameters.  . Reviewed scheduled/upcoming provider appointments including: 09-13-2020 Self-Care Activities: - Self administers medications as prescribed Attends all scheduled provider appointments Calls provider office for new concerns, questions, or BP outside discussed parameters Checks BP and records as discussed Follows a low sodium diet/DASH diet Patient Goals: - check blood pressure weekly - choose a place to take my blood pressure (home, clinic or office, retail store) - write blood pressure results in a log or diary - agree on reward when goals are met - agree to work together to make changes - ask questions to understand - have a family meeting to talk about healthy habits - learn about high blood pressure - blood pressure trends reviewed - depression screen reviewed - home or ambulatory blood pressure monitoring encouraged Follow Up Plan: Telephone follow up appointment with care management team member scheduled for: 10-17-2020 at 345 pm   Care Plan : RNCM: HLD Management  Updates made by Vanita Ingles since 08/08/2020 12:00 AM    Problem: RNCM: Management of HLD   Priority: Medium    Long-Range Goal: RNCM: Management of HLD   Priority: Medium  Note:   Current Barriers:  . Poorly controlled hyperlipidemia, complicated by uncontrolled DM . Current antihyperlipidemic regimen: Lipitor 20 Mg  QD . Most recent lipid panel:     Component Value Date/Time   CHOL 235 (H) 03/22/2020 0833   TRIG 242 (H) 03/22/2020 0833   HDL 36 (L) 03/22/2020 0833   CHOLHDL 6.5 (H) 03/22/2020 0833   LDLCALC 158 (H) 03/22/2020 9485 .   Marland Kitchen ASCVD risk enhancing conditions: age 57,  DM, HTN, current smoker . Unable to independently manage chronic conditons  . Lacks social connections . Does not maintain contact with provider office . Does not contact provider office for questions/concerns RN Care Manager Clinical Goal(s):  . patient will work with Consulting civil engineer, providers, and care team towards execution of optimized self-health management plan . patient will verbalize understanding of plan for effective management of HLD  . patient will work with [facility] [place] team  to address needs related to HLD  . patient will attend all scheduled medical appointments: 06-07-2020 Interventions: . Collaboration with Malfi, Lupita Raider, FNP regarding development and update of comprehensive plan of care as evidenced by provider attestation and co-signature . Inter-disciplinary care team collaboration (see  longitudinal plan of care) . Medication review performed; medication list updated in electronic medical record.  Bertram Savin care team collaboration (see longitudinal plan of care) . Referred to pharmacy team for assistance with HLD medication management . Evaluation of current treatment plan related to HLD  and patient's adherence to plan as established by provider. . Advised patient to call the office for changes or questions . Provided education to patient re: heart healthy/ADA diet. 08-08-2020: States compliance with heart healthy diet. Denies any new concerns at this time.  . Reviewed medications with patient and discussed compliance/cost constraints/questions. 08-08-2020: States that he is taking medications as prescribed  . Provided patient with dietary restriction  educational materials related to Heart  healthy/ADA diet and the benefits of controlling chronic conditions to promote health and well being.  . Reviewed scheduled/upcoming provider appointments including: 09-13-2020  . Discussed plans with patient for ongoing care management follow up and provided patient with direct contact information for care management team Patient Goals/Self-Care Activities: - call for medicine refill 2 or 3 days before it runs out - call if I am sick and can't take my medicine - keep a list of all the medicines I take; vitamins and herbals too - learn to read medicine labels - use a pillbox to sort medicine - use an alarm clock or phone to remind me to take my medicine - change to whole grain breads, cereal, pasta - drink 6 to 8 glasses of water each day - eat 3 to 5 servings of fruits and vegetables each day - eat 5 or 6 small meals each day - fill half the plate with nonstarchy vegetables - limit fast food meals to no more than 1 per week - manage portion size - prepare main meal at home 3 to 5 days each week - read food labels for fat, fiber, carbohydrates and portion size - switch to low-fat or skim milk - switch to sugar-free drinks - be open to making changes - I can manage, know and watch for signs of a heart attack - if I have chest pain, call for help - learn about small changes that will make a big difference - learn my personal risk factors  Follow Up Plan: Telephone follow up appointment with care management team member scheduled for: 10-17-2020 at 345 pm       Plan: Telephone follow up appointment with care management team member scheduled for:  10-17-2020 at 345 pm  [place], MSN, [place] Mobile: [phone]

## 2020-08-31 ENCOUNTER — Ambulatory Visit: Payer: BC Managed Care – PPO | Admitting: Pharmacist

## 2020-08-31 DIAGNOSIS — I10 Essential (primary) hypertension: Secondary | ICD-10-CM

## 2020-08-31 DIAGNOSIS — E1165 Type 2 diabetes mellitus with hyperglycemia: Secondary | ICD-10-CM

## 2020-08-31 DIAGNOSIS — E785 Hyperlipidemia, unspecified: Secondary | ICD-10-CM

## 2020-08-31 NOTE — Chronic Care Management (AMB) (Signed)
Care Management   Pharmacy Note  08/31/2020 Name: Anthony Bell MRN: [id number] DOB: 1955/08/07  Subjective: Anthony Bell is a 65 y.o. year old male who is a primary care patient of Lorine Bears, Lupita Raider, FNP. The Care Management team was consulted for assistance with care management and care coordination needs.    Engaged with patient by telephone for follow up visit in response to provider referral for pharmacy case management and/or care coordination services.   The patient was given information about Care Management services today including:  1. Care Management services includes personalized support from designated clinical staff supervised by the patient's primary care provider, including individualized plan of care and coordination with other care providers. 2. 24/7 contact phone numbers for assistance for urgent and routine care needs. 3. The patient may stop case management services at any time by phone call to the office staff.  Patient agreed to services and consent obtained.  Assessment:  Review of patient status, including review of consultants reports, laboratory and other test data, was performed as part of comprehensive evaluation and provision of chronic care management services.   SDOH (Social Determinants of Health) assessments and interventions performed:  none  Objective:  Lab Results  Component Value Date   CREATININE 1.21 06/02/2020   CREATININE 1.47 (H) 03/22/2020   CREATININE 1.55 (H) 02/02/2018    Lab Results  Component Value Date   HGBA1C 14.0 (A) 06/02/2020       Component Value Date/Time   CHOL 235 (H) 03/22/2020 0833   TRIG 242 (H) 03/22/2020 0833   HDL 36 (L) 03/22/2020 0833   CHOLHDL 6.5 (H) 03/22/2020 0833   LDLCALC 158 (H) 03/22/2020 0833    Clinical ASCVD: No  The 10-year ASCVD risk score Mikey Bussing DC Jr., et al., 2013) is: 46.8%   Values used to calculate the score:     Age: 30 years     Sex: Male     Is Non-Hispanic African American: Yes      Diabetic: Yes     Tobacco smoker: Yes     Systolic Blood Pressure: 989 mmHg     Is BP treated: Yes     HDL Cholesterol: 36 mg/dL     Total Cholesterol: 235 mg/dL     BP Readings from Last 3 Encounters:  07/12/20 129/60  06/14/20 (!) 158/63  06/07/20 (!) 162/60    Care Plan  Not on File  Medications Reviewed Today    Reviewed by Vanita Ingles on 08/08/20 at 1618  Med List Status: <None>  Medication Order Taking? Sig Documenting Provider Last Dose Status Informant  amLODipine (NORVASC) 5 MG tablet [id number] No Take 1 tablet (5 mg total) by mouth daily. [name], FNP Taking Active   aspirin EC 81 MG tablet [id number] No Take 81 mg by mouth.  [provider] Taking Active Self  atorvastatin (LIPITOR) 20 MG tablet [id number] No Take 1 tablet (20 mg total) by mouth daily. [name], FNP Taking Active   blood glucose meter kit and supplies KIT [id number] No Dispense based on patient and insurance preference. Use up to four times daily as directed. 100 strips and lancets please [name], NP Taking Active   Blood Glucose Monitoring Suppl (CONTOUR NEXT EZ) w/Device KIT [id number] No USE UP TO FOUR TIMES DAILY AS DIRECTED. (FOR ICD-10 E10.9, E11.9). [name], DO Taking Active   CONTOUR NEXT TEST test strip [id number] No USE AS DIRECTED UPTO 4 TIMES A DAY [name],  NP Taking Active   empagliflozin (JARDIANCE) 10 MG TABS tablet [id number] No Take 1 tablet (10 mg total) by mouth daily before breakfast. Malfi, Lupita Raider, FNP Taking Active   hydrochlorothiazide (HYDRODIURIL) 25 MG tablet [id number] No Take 1 tablet (25 mg total) by mouth daily. Malfi, Lupita Raider, FNP Taking Active   insulin glargine (LANTUS SOLOSTAR) 100 UNIT/ML Solostar Pen [id number] No Inject 20 Units into the skin daily. [name], NP Taking Active   Insulin Pen Needle 32G X 4 MM MISC [id number] No 1 Units by Does not apply route every morning. Pen needles [name], NP Taking  Active   lisinopril (ZESTRIL) 20 MG tablet [id number] No Take 1 tablet (20 mg total) by mouth daily. Malfi, Lupita Raider, FNP Taking Active   metFORMIN (GLUCOPHAGE) 1000 MG tablet [id number] No Take 1 tablet (1,000 mg total) by mouth 2 (two) times daily with a meal. [name], NP Taking Active   tadalafil (CIALIS) 5 MG tablet [id number] No Take 1 tablet (5 mg total) by mouth daily as needed for erectile dysfunction. Malfi, Lupita Raider, FNP Taking Active           Patient Active Problem List   Diagnosis Date Noted  . COPD (chronic obstructive pulmonary disease) ([place]) 06/14/2020  . Hyperlipidemia 03/23/2020  . Erectile dysfunction 03/22/2020  . Toenail fungus 07/17/2019  . Type 2 diabetes mellitus ([place]) 08/23/2017  . Gastroesophageal reflux disease 12/07/2014  . Essential hypertension 12/07/2014  . Rectal cancer ([place]) 12/07/2014  . Tobacco abuse 12/07/2014  . Tubular adenoma polyp of rectum 11/08/2014    Conditions to be addressed/monitored:HTN, HLD and DMII  Care Plan : PharmD Medication Management  Updates made by Vella Raring, RPH-CPP since 08/31/2020 12:00 AM    Problem: Disease Progression     Long-Range Goal: Disease Progression Prevented or Minimized   Start Date: 06/06/2020  Expected End Date: 09/04/2020  This Visit's Progress: On track  Recent Progress: On track  Priority: High  Note:   Current Barriers:  . Financial barriers o Using copayment card from manufacturers for Jardiance and Lantus . Lack of blood pressure results for clinical team  Pharmacist Clinical Goal(s):  Marland Kitchen Over the next 90 days, patient will achieve control of blood sugar as evidenced by A1C <7%  through collaboration with PharmD and provider.   Interventions: . 1:1 collaboration with Webb Silversmith NP regarding development and update of comprehensive plan of care as evidenced by provider attestation and co-signature . Inter-disciplinary care team collaboration (see longitudinal plan of  care)  T2DM: . Uncontrolled; current treatment: o Jardiance 10 mg daily before breakfast o Metformin 1000 mg twice daily with meals o Lantus 20 units daily in evening . Report recent home fasting blood sugar results ranging 115-130 . Patient denies s/s of hypoglycemia o Have discussed s/s of low blood sugar and how to manage lows . Have counseled on importance of eating regular and well-balanced meals throughout the day. Working on eating breakfast once he gets to work . Reported walks daily ~10 minutes (around yard and to [employer]).  . Again encouraged patient to schedule appointment for annual eye exam  Hypertension: . Current treatment: o HCTZ 25 mg daily o Lisinopril 20 mg daily o Amlodipine 5 mg daily . Recalls last checked BP at work last week: 120/60 . Denies signs of hypotension or hypertension . Encourage patient to monitor BP and bring log of results to upcoming medical appointment  Hyperlipidemia: . Uncontrolled; current treatment: o Atorvastatin 20 mg  daily - Note patient previously out of Rx, but refilled on 3/23 and reports now taking consistently  Medication Adherence: . Note his wife helps to manage his medications including his Lantus nightly . Using weekly pillboxes as adherence aid. Reports filling pillbox himself but wife also assists with medication management  Tobacco Abuse:  Reported smokes ~1 ppd  Have discussed benefits of smoking cessation   Patient Goals/Self-Care Activities . Over the next 90 days, patient will:  - check glucose, document, and provide at future appointments - check blood pressure, document, and provide at future appointments - attend medical appointments as scheduled  Appointment with PCP NP Webb Silversmith on 6/21  Follow Up Plan: Telephone follow up appointment with care management team member scheduled for: 7/18 at 4:15 pm      Follow Up:  Patient agrees to Care Plan and Follow-up.  Harlow Asa, PharmD, [place],  CPP Clinical Pharmacist [facility] [phone]

## 2020-08-31 NOTE — Patient Instructions (Signed)
Thank you allowing the Care Management Team to be a part of your care! It was a pleasure speaking with you today!     Care Management Team    Noreene Larsson RN, MSN, CCM Nurse Care Coordinator  [phone]   Harlow Asa PharmD  Clinical Pharmacist  [phone]   Eula Fried LCSW Clinical Social Worker [phone]   Visit Information  Goals Addressed            This Visit's Progress   . Pharmacy Goals       Our goal A1c is less than 7%. This corresponds with fasting sugars less than 130 and 2 hour after meal sugars less than 180. Please check your fasting blood sugar and keep a log of the results  Our goal bad cholesterol, or LDL, is less than 70 . This is why it is important to continue taking your atorvastatin  Our goal blood pressure is less than 130/80. Please check your blood pressure and keep log of the results.  Feel free to call me with any questions or concerns. I look forward to our next call!    Harlow Asa, PharmD, [place] [phone]       The patient verbalized understanding of instructions, educational materials, and care plan provided today and declined offer to receive copy of patient instructions, educational materials, and care plan.   Telephone follow up appointment with care management team member scheduled for: 7/18 at 4:15 pm

## 2020-09-13 ENCOUNTER — Encounter: Payer: Self-pay | Admitting: Internal Medicine

## 2020-09-13 ENCOUNTER — Ambulatory Visit: Payer: BC Managed Care – PPO | Admitting: Internal Medicine

## 2020-09-13 ENCOUNTER — Other Ambulatory Visit: Payer: Self-pay

## 2020-09-13 VITALS — BP 121/49 | HR 69 | Temp 97.7°F | Resp 17 | Ht 68.0 in | Wt 194.0 lb

## 2020-09-13 DIAGNOSIS — K219 Gastro-esophageal reflux disease without esophagitis: Secondary | ICD-10-CM

## 2020-09-13 DIAGNOSIS — J42 Unspecified chronic bronchitis: Secondary | ICD-10-CM

## 2020-09-13 DIAGNOSIS — E782 Mixed hyperlipidemia: Secondary | ICD-10-CM

## 2020-09-13 DIAGNOSIS — E663 Overweight: Secondary | ICD-10-CM | POA: Insufficient documentation

## 2020-09-13 DIAGNOSIS — Z85048 Personal history of other malignant neoplasm of rectum, rectosigmoid junction, and anus: Secondary | ICD-10-CM | POA: Diagnosis not present

## 2020-09-13 DIAGNOSIS — I10 Essential (primary) hypertension: Secondary | ICD-10-CM | POA: Diagnosis not present

## 2020-09-13 DIAGNOSIS — E1165 Type 2 diabetes mellitus with hyperglycemia: Secondary | ICD-10-CM

## 2020-09-13 DIAGNOSIS — N522 Drug-induced erectile dysfunction: Secondary | ICD-10-CM

## 2020-09-13 LAB — POCT GLYCOSYLATED HEMOGLOBIN (HGB A1C): Hemoglobin A1C: 6.6 % — AB (ref 4.0–5.6)

## 2020-09-13 NOTE — Assessment & Plan Note (Signed)
Avoid foods that trigger reflux Encouraged weight loss as this can help reduce reflux symptoms CBC and c-Met today

## 2020-09-13 NOTE — Assessment & Plan Note (Signed)
C-Met and lipid profile today Encouraged him to consume a low-fat diet Continue atorvastatin, will adjust if needed based on labs

## 2020-09-13 NOTE — Assessment & Plan Note (Signed)
Encourage smoking cessation Continue albuterol as needed 

## 2020-09-13 NOTE — Assessment & Plan Note (Signed)
Cialis not effective He is not interested in additional medication therapy at this time

## 2020-09-13 NOTE — Assessment & Plan Note (Signed)
Controlled on Amlodipine, Lisinopril and HCTZ Reinforced DASH diet and exercise weight loss C-Met today

## 2020-09-13 NOTE — Assessment & Plan Note (Signed)
In remission.

## 2020-09-13 NOTE — Progress Notes (Signed)
Subjective:    Patient ID: Anthony Bell, male    DOB: [birth year], 65 y.o.   MRN: [id number]  HPI  Patient presents the clinic today for follow-up of chronic conditions.  HTN: His BP today is 121/49.  He is taking Amlodipine, Lisinopril and HCTZ as prescribed.  ECG from 05/2015 reviewed.  HLD: His last LDL was 158, triglycerides 242, 02/2020.  He denies myalgias on Atorvastatin.  He does not consume a low-fat diet.  DM2: His last A1c was 14%, 05/2020.  He is taking Metformin, Jardiance and Lantus as prescribed.  His sugars range 120-125.  He does not check his feet routinely.  His last eye exam was more than 2 years ago.  Flu never.  Pneumovax 07/2017.  Arboriculturist.  GERD: Triggered by spicy foods.  He takes TUMS as needed with good relief of symptoms.  Upper GI from 08/2014 reviewed.  COPD: He denies current cough or shortness of breath.  He takes Albuterol as needed with good relief of symptoms.  There are no PFTs on file.  He does continue to smoke.  History of Rectal Cancer: s/p surgery, no chemo or radiation.  ED: He reports Cialis is not effective. He does not follow with urology.  Review of Systems     Past Medical History:  Diagnosis Date   Colon polyps    Diabetes mellitus without complication (HCC)    GERD (gastroesophageal reflux disease)    Hypertension    Rectal cancer (HCC)    [facility]    Current Outpatient Medications  Medication Sig Dispense Refill   amLODipine (NORVASC) 5 MG tablet Take 1 tablet (5 mg total) by mouth daily. 90 tablet 1   aspirin EC 81 MG tablet Take 81 mg by mouth.      atorvastatin (LIPITOR) 20 MG tablet Take 1 tablet (20 mg total) by mouth daily. 90 tablet 3   blood glucose meter kit and supplies KIT Dispense based on patient and insurance preference. Use up to four times daily as directed. 100 strips and lancets please 1 each 0   Blood Glucose Monitoring Suppl (CONTOUR NEXT EZ) w/Device KIT USE UP TO FOUR TIMES DAILY AS DIRECTED. (FOR  ICD-10 E10.9, E11.9). 1 kit 0   CONTOUR NEXT TEST test strip USE AS DIRECTED UPTO 4 TIMES A DAY 100 strip 5   empagliflozin (JARDIANCE) 10 MG TABS tablet Take 1 tablet (10 mg total) by mouth daily before breakfast. 90 tablet 1   hydrochlorothiazide (HYDRODIURIL) 25 MG tablet Take 1 tablet (25 mg total) by mouth daily. 90 tablet 1   insulin glargine (LANTUS SOLOSTAR) 100 UNIT/ML Solostar Pen Inject 20 Units into the skin daily. 15 mL PRN   Insulin Pen Needle 32G X 4 MM MISC 1 Units by Does not apply route every morning. Pen needles 90 each 3   lisinopril (ZESTRIL) 20 MG tablet Take 1 tablet (20 mg total) by mouth daily. 90 tablet 1   metFORMIN (GLUCOPHAGE) 1000 MG tablet Take 1 tablet (1,000 mg total) by mouth 2 (two) times daily with a meal. 180 tablet 3   tadalafil (CIALIS) 5 MG tablet Take 1 tablet (5 mg total) by mouth daily as needed for erectile dysfunction. 10 tablet 0   No current facility-administered medications for this visit.    Not on File  Family History  Problem Relation Age of Onset   Diabetes Mother    Cancer Father    Diabetes Father    Diabetes Sister  Heart attack Neg Hx    Stroke Neg Hx     Social History   Socioeconomic History   Marital status: Married    Spouse name: Not on file   Number of children: Not on file   Years of education: Not on file   Highest education level: Not on file  Occupational History   Not on file  Tobacco Use   Smoking status: Every Day    Packs/day: 1.00    Years: 40.00    Pack years: 40.00    Types: Cigarettes   Smokeless tobacco: Never  Vaping Use   Vaping Use: Never used  Substance and Sexual Activity   Alcohol use: No   Drug use: No   Sexual activity: Not on file  Other Topics Concern   Not on file  Social History Narrative   Not on file   Social Determinants of Health   Financial Resource Strain: Low Risk    Difficulty of Paying Living Expenses: Not very hard  Food Insecurity: No Food Insecurity   Worried  About Running Out of Food in the Last Year: Never true   Ran Out of Food in the Last Year: Never true  Transportation Needs: No Transportation Needs   Lack of Transportation (Medical): No   Lack of Transportation (Non-Medical): No  Physical Activity: Not on file  Stress: No Stress Concern Present   Feeling of Stress : Only a little  Social Connections: Unknown   Frequency of Communication with Friends and Family: More than three times a week   Frequency of Social Gatherings with Friends and Family: More than three times a week   Attends Religious Services: Not on Electrical engineer or Organizations: Not on file   Attends Archivist Meetings: Not on file   Marital Status: Married  Human resources officer Violence: Not At Risk   Fear of Current or Ex-Partner: No   Emotionally Abused: No   Physically Abused: No   Sexually Abused: No     Constitutional: Denies fever, malaise, fatigue, headache or abrupt weight changes.  HEENT: Denies eye pain, eye redness, ear pain, ringing in the ears, wax buildup, runny nose, nasal congestion, bloody nose, or sore throat. Respiratory: Denies difficulty breathing, shortness of breath, cough or sputum production.   Cardiovascular: Denies chest pain, chest tightness, palpitations or swelling in the hands or feet.  Gastrointestinal: Denies abdominal pain, bloating, constipation, diarrhea or blood in the stool.  GU: Patient reports erectile dysfunction.  Denies urgency, frequency, pain with urination, burning sensation, blood in urine, odor or discharge. Musculoskeletal: Denies decrease in range of motion, difficulty with gait, muscle pain or joint pain and swelling.  Skin: Denies redness, rashes, lesions or ulcercations.  Neurological: Denies dizziness, difficulty with memory, difficulty with speech or problems with balance and coordination.  Psych: Denies anxiety, depression, SI/HI.  No other specific complaints in a complete review of  systems (except as listed in HPI above).  Objective:   Physical Exam BP (!) 121/49 (BP Location: Right Arm, Patient Position: Sitting, Cuff Size: Large)   Pulse 69   Temp 97.7 F (36.5 C) (Temporal)   Resp 17   Ht 5' 8"  (1.727 m)   Wt 194 lb (88 kg)   SpO2 100%   BMI 29.50 kg/m   Wt Readings from Last 3 Encounters:  07/12/20 187 lb 3.2 oz (84.9 kg)  06/14/20 186 lb 6.4 oz (84.6 kg)  06/07/20 180 lb 9.6 oz (81.9 kg)  General: Appears his stated age, overweight, in NAD. Skin: Warm, dry and intact. No ulcerations noted. HEENT: Head: normal shape and size; Eyes: sclera white and EOMs intact;  Neck:  Neck supple, trachea midline. No masses, lumps or thyromegaly present.  Cardiovascular: Normal rate and rhythm. S1,S2 noted.  No murmur, rubs or gallops noted. No JVD or BLE edema. No carotid bruits noted. Pulmonary/Chest: Normal effort and positive vesicular breath sounds. No respiratory distress. No wheezes, rales or ronchi noted.  Abdomen: Soft and nontender. Normal bowel sounds. No distention or masses noted. Musculoskeletal:  No difficulty with gait.  Neurological: Alert and oriented. Cranial nerves II-XII grossly intact.  Anda Kraft believes that  BMET    Component Value Date/Time   NA 136 06/02/2020 0958   NA 144 10/10/2011 1615   K 4.2 06/02/2020 0958   K 4.3 10/10/2011 1615   CL 94 (L) 06/02/2020 0958   CL 104 10/10/2011 1615   CO2 28 06/02/2020 0958   CO2 35 (H) 10/10/2011 1615   GLUCOSE 288 (H) 06/02/2020 0958   GLUCOSE 106 (H) 10/10/2011 1615   BUN 22 06/02/2020 0958   BUN 17 10/10/2011 1615   CREATININE 1.21 06/02/2020 0958   CALCIUM 9.7 06/02/2020 0958   CALCIUM 9.2 10/10/2011 1615   GFRNONAA 50 (L) 03/22/2020 0833   GFRAA 58 (L) 03/22/2020 0833    Lipid Panel     Component Value Date/Time   CHOL 235 (H) 03/22/2020 0833   TRIG 242 (H) 03/22/2020 0833   HDL 36 (L) 03/22/2020 0833   CHOLHDL 6.5 (H) 03/22/2020 0833   LDLCALC 158 (H) 03/22/2020 0833     CBC    Component Value Date/Time   WBC 7.7 06/02/2020 0958   RBC 6.32 (H) 06/02/2020 0958   HGB 16.9 06/02/2020 0958   HGB 14.9 10/10/2011 1615   HCT 50.3 (H) 06/02/2020 0958   HCT 46.9 10/10/2011 1615   PLT 222 06/02/2020 0958   PLT 159 10/10/2011 1615   MCV 79.6 (L) 06/02/2020 0958   MCV 87 10/10/2011 1615   MCH 26.7 (L) 06/02/2020 0958   MCHC 33.6 06/02/2020 0958   RDW 14.1 06/02/2020 0958   RDW 14.3 10/10/2011 1615   LYMPHSABS 1,448 06/02/2020 0958   EOSABS 193 06/02/2020 0958   BASOSABS 31 06/02/2020 0958    Hgb A1C Lab Results  Component Value Date   HGBA1C 14.0 (A) 06/02/2020            Assessment & Plan:    Webb Silversmith, NP This visit occurred during the SARS-CoV-2 public health emergency.  Safety protocols were in place, including screening questions prior to the visit, additional usage of staff PPE, and extensive cleaning of exam room while observing appropriate contact time as indicated for disinfecting solutions.

## 2020-09-13 NOTE — Assessment & Plan Note (Signed)
Encourage low-carb diet and exercise for weight loss 

## 2020-09-13 NOTE — Patient Instructions (Signed)
Heart-Healthy Eating Plan Many factors influence your heart (coronary) health, including eating and exercise habits. Coronary risk increases with abnormal blood fat (lipid) levels. Heart-healthy meal planning includes limiting unhealthy fats,increasing healthy fats, and making other diet and lifestyle changes. What is my plan? Your health care provider may recommend that you: Limit your fat intake to _________% or less of your total calories each day. Limit your saturated fat intake to _________% or less of your total calories each day. Limit the amount of cholesterol in your diet to less than _________ mg per day. What are tips for following this plan? Cooking Cook foods using methods other than frying. Baking, boiling, grilling, and broiling are all good options. Other ways to reduce fat include: Removing the skin from poultry. Removing all visible fats from meats. Steaming vegetables in water or broth. Meal planning  At meals, imagine dividing your plate into fourths: Fill one-half of your plate with vegetables and green salads. Fill one-fourth of your plate with whole grains. Fill one-fourth of your plate with lean protein foods. Eat 4-5 servings of vegetables per day. One serving equals 1 cup raw or cooked vegetable, or 2 cups raw leafy greens. Eat 4-5 servings of fruit per day. One serving equals 1 medium whole fruit,  cup dried fruit,  cup fresh, frozen, or canned fruit, or  cup 100% fruit juice. Eat more foods that contain soluble fiber. Examples include apples, broccoli, carrots, beans, peas, and barley. Aim to get 25-30 g of fiber per day. Increase your consumption of legumes, nuts, and seeds to 4-5 servings per week. One serving of dried beans or legumes equals  cup cooked, 1 serving of nuts is  cup, and 1 serving of seeds equals 1 tablespoon.  Fats Choose healthy fats more often. Choose monounsaturated and polyunsaturated fats, such as olive and canola oils, flaxseeds,  walnuts, almonds, and seeds. Eat more omega-3 fats. Choose salmon, mackerel, sardines, tuna, flaxseed oil, and ground flaxseeds. Aim to eat fish at least 2 times each week. Check food labels carefully to identify foods with trans fats or high amounts of saturated fat. Limit saturated fats. These are found in animal products, such as meats, butter, and cream. Plant sources of saturated fats include palm oil, palm kernel oil, and coconut oil. Avoid foods with partially hydrogenated oils in them. These contain trans fats. Examples are stick margarine, some tub margarines, cookies, crackers, and other baked goods. Avoid fried foods. General information Eat more home-cooked food and less restaurant, buffet, and fast food. Limit or avoid alcohol. Limit foods that are high in starch and sugar. Lose weight if you are overweight. Losing just 5-10% of your body weight can help your overall health and prevent diseases such as diabetes and heart disease. Monitor your salt (sodium) intake, especially if you have high blood pressure. Talk with your health care provider about your sodium intake. Try to incorporate more vegetarian meals weekly. What foods can I eat? Fruits All fresh, canned (in natural juice), or frozen fruits. Vegetables Fresh or frozen vegetables (raw, steamed, roasted, or grilled). Green salads. Grains Most grains. Choose whole wheat and whole grains most of the time. Rice andpasta, including brown rice and pastas made with whole wheat. Meats and other proteins Lean, well-trimmed beef, veal, pork, and lamb. Chicken and turkey without skin. All fish and shellfish. Wild duck, rabbit, pheasant, and venison. Egg whites or low-cholesterol egg substitutes. Dried beans, peas, lentils, and tofu. Seedsand most nuts. Dairy Low-fat or nonfat cheeses, including ricotta   and mozzarella. Skim or 1% milk (liquid, powdered, or evaporated). Buttermilk made with low-fat milk. Nonfat orlow-fat yogurt. Fats  and oils Non-hydrogenated (trans-free) margarines. Vegetable oils, including soybean, sesame, sunflower, olive, peanut, safflower, corn, canola, and cottonseed. Salad dressings or mayonnaisemade with a vegetable oil. Beverages Water (mineral or sparkling). Coffee and tea. Diet carbonated beverages. Sweets and desserts Sherbet, gelatin, and fruit ice. Small amounts of dark chocolate. Limit all sweets and desserts. Seasonings and condiments All seasonings and condiments. The items listed above may not be a complete list of foods and beverages you can eat. Contact a dietitian for more options. What foods are not recommended? Fruits Canned fruit in heavy syrup. Fruit in cream or butter sauce. Fried fruit. Limitcoconut. Vegetables Vegetables cooked in cheese, cream, or butter sauce. Fried vegetables. Grains Breads made with saturated or trans fats, oils, or whole milk. Croissants. Sweet rolls. Donuts. High-fat crackers,such as cheese crackers. Meats and other proteins Fatty meats, such as hot dogs, ribs, sausage, bacon, rib-eye roast or steak. High-fat deli meats, such as salami and bologna. Caviar. Domestic duck andgoose. Organ meats, such as liver. Dairy Cream, sour cream, cream cheese, and creamed cottage cheese. Whole milk cheeses. Whole or 2% milk (liquid, evaporated, or condensed). Whole buttermilk.Cream sauce or high-fat cheese sauce. Whole-milk yogurt. Fats and oils Meat fat, or shortening. Cocoa butter, hydrogenated oils, palm oil, coconut oil, palm kernel oil. Solid fats and shortenings, including bacon fat, salt pork, lard, and butter. Nondairy cream substitutes. Salad dressings with cheeseor sour cream. Beverages Regular sodas and any drinks with added sugar. Sweets and desserts Frosting. Pudding. Cookies. Cakes. Pies. Milk chocolate or white chocolate.Buttered syrups. Full-fat ice cream or ice cream drinks. The items listed above may not be a complete list of foods and beverages to  avoid. Contact a dietitian for more information. Summary Heart-healthy meal planning includes limiting unhealthy fats, increasing healthy fats, and making other diet and lifestyle changes. Lose weight if you are overweight. Losing just 5-10% of your body weight can help your overall health and prevent diseases such as diabetes and heart disease. Focus on eating a balance of foods, including fruits and vegetables, low-fat or nonfat dairy, lean protein, nuts and legumes, whole grains, and heart-healthy oils and fats. This information is not intended to replace advice given to you by your health care provider. Make sure you discuss any questions you have with your healthcare provider. Document Revised: 04/19/2017 Document Reviewed: 04/19/2017 Elsevier Patient Education  2022 [employer] Inc.  

## 2020-09-13 NOTE — Assessment & Plan Note (Signed)
POCT A1c 6.6% No urine microalbumin secondary to ACEI therapy Encouraged him to consume a low-carb diet and exercise for weight loss Continue metformin, Jardiance and Lantus Advised him to make an appointment for his eye exam, he declines referral at this time Encourage routine eye exams He declines flu shots Pneumovax UTD Encouraged him to get his COVID booster

## 2020-09-13 NOTE — Addendum Note (Signed)
Addended by: [name] on: 09/13/2020 03:58 PM   Modules accepted: Orders

## 2020-09-20 ENCOUNTER — Other Ambulatory Visit: Payer: Self-pay

## 2020-09-20 ENCOUNTER — Other Ambulatory Visit: Payer: BC Managed Care – PPO

## 2020-09-20 DIAGNOSIS — I10 Essential (primary) hypertension: Secondary | ICD-10-CM

## 2020-09-20 DIAGNOSIS — E1165 Type 2 diabetes mellitus with hyperglycemia: Secondary | ICD-10-CM | POA: Diagnosis not present

## 2020-09-20 MED ORDER — EMPAGLIFLOZIN 10 MG PO TABS: 10.0000 mg | ORAL_TABLET | Freq: Every day | ORAL | 1 refills | Status: DC

## 2020-09-20 MED ORDER — AMLODIPINE BESYLATE 5 MG PO TABS: 5.0000 mg | ORAL_TABLET | Freq: Every day | ORAL | 1 refills | Status: DC

## 2020-09-21 LAB — LIPID PANEL
Cholesterol: 125 mg/dL (ref <200)
HDL: 34 mg/dL — ABNORMAL LOW (ref >=40)
LDL Cholesterol (Calc): 70 mg/dL (calc)
Non-HDL Cholesterol (Calc): 91 mg/dL (calc) (ref <130)
Total CHOL/HDL Ratio: 3.7 (calc) (ref <5.0)
Triglycerides: 119 mg/dL (ref <150)

## 2020-09-21 LAB — COMPLETE METABOLIC PANEL WITH GFR
AG Ratio: 1.5 (calc) (ref 1.0–2.5)
ALT: 12 U/L (ref 9–46)
AST: 12 U/L (ref 10–35)
Albumin: 4.1 g/dL (ref 3.6–5.1)
Alkaline phosphatase (APISO): 90 U/L (ref 35–144)
BUN/Creatinine Ratio: 13 (calc) (ref 6–22)
BUN: 17 mg/dL (ref 7–25)
CO2: 31 mmol/L (ref 20–32)
Calcium: 9.6 mg/dL (ref 8.6–10.3)
Chloride: 103 mmol/L (ref 98–110)
Creat: 1.26 mg/dL — ABNORMAL HIGH (ref 0.70–1.25)
GFR, Est African American: 70 mL/min/{1.73_m2} (ref >=60)
GFR, Est Non African American: 60 mL/min/{1.73_m2} (ref >=60)
Globulin: 2.8 g/dL (calc) (ref 1.9–3.7)
Glucose, Bld: 78 mg/dL (ref 65–99)
Potassium: 3.5 mmol/L (ref 3.5–5.3)
Sodium: 141 mmol/L (ref 135–146)
Total Bilirubin: 0.4 mg/dL (ref 0.2–1.2)
Total Protein: 6.9 g/dL (ref 6.1–8.1)

## 2020-09-21 LAB — CBC
HCT: 52.9 % — ABNORMAL HIGH (ref 38.5–50.0)
Hemoglobin: 16.9 g/dL (ref 13.2–17.1)
MCH: 27.1 pg (ref 27.0–33.0)
MCHC: 31.9 g/dL — ABNORMAL LOW (ref 32.0–36.0)
MCV: 84.8 fL (ref 80.0–100.0)
MPV: 10.7 fL (ref 7.5–12.5)
Platelets: 187 10*3/uL (ref 140–400)
RBC: 6.24 10*6/uL — ABNORMAL HIGH (ref 4.20–5.80)
RDW: 14.5 % (ref 11.0–15.0)
WBC: 8.8 10*3/uL (ref 3.8–10.8)

## 2020-10-10 ENCOUNTER — Telehealth: Payer: Self-pay

## 2020-10-12 ENCOUNTER — Ambulatory Visit: Payer: Self-pay | Admitting: Pharmacist

## 2020-10-12 DIAGNOSIS — E1165 Type 2 diabetes mellitus with hyperglycemia: Secondary | ICD-10-CM

## 2020-10-12 DIAGNOSIS — E782 Mixed hyperlipidemia: Secondary | ICD-10-CM

## 2020-10-12 DIAGNOSIS — I10 Essential (primary) hypertension: Secondary | ICD-10-CM

## 2020-10-12 NOTE — Chronic Care Management (AMB) (Signed)
Care Management   Pharmacy Note  10/12/2020 Name: Anthony Bell MRN: [id number] DOB: 11/24/1955  Subjective: Anthony Bell is a 65 y.o. year old male who is a primary care patient of [name], NP. The Care Management team was consulted for assistance with care management and care coordination needs.    Engaged with patient by telephone for follow up visit in response to provider referral for pharmacy case management and/or care coordination services.   The patient was given information about Care Management services today including:  Care Management services includes personalized support from designated clinical staff supervised by the patient's primary care provider, including individualized plan of care and coordination with other care providers. 24/7 contact phone numbers for assistance for urgent and routine care needs. The patient may stop case management services at any time by phone call to the office staff.  Patient agreed to services and consent obtained.  Assessment:  Review of patient status, including review of consultants reports, laboratory and other test data, was performed as part of comprehensive evaluation and provision of chronic care management services.   SDOH (Social Determinants of Health) assessments and interventions performed:    Objective:  Lab Results  Component Value Date   CREATININE 1.26 (H) 09/20/2020   CREATININE 1.21 06/02/2020   CREATININE 1.47 (H) 03/22/2020    Lab Results  Component Value Date   HGBA1C 6.6 (A) 09/13/2020       Component Value Date/Time   CHOL 125 09/20/2020 0750   TRIG 119 09/20/2020 0750   HDL 34 (L) 09/20/2020 0750   CHOLHDL 3.7 09/20/2020 0750   LDLCALC 70 09/20/2020 0750    BP Readings from Last 3 Encounters:  09/13/20 (!) 121/49  07/12/20 129/60  06/14/20 (!) 158/63    Care Plan  No Known Allergies  Medications Reviewed Today     Reviewed by Vella Raring, RPH-CPP (Pharmacist) on  10/12/20 at [place] List Status: <None>   Medication Order Taking? Sig Documenting Provider Last Dose Status Informant  amLODipine (NORVASC) 5 MG tablet [id number] Yes Take 1 tablet (5 mg total) by mouth daily. [name], NP Taking Active   aspirin EC 81 MG tablet [id number] Yes Take 81 mg by mouth.  [provider] Taking Active Self  atorvastatin (LIPITOR) 20 MG tablet [id number] Yes Take 1 tablet (20 mg total) by mouth daily. [name], FNP Taking Active   blood glucose meter kit and supplies KIT [id number]  Dispense based on patient and insurance preference. Use up to four times daily as directed. 100 strips and lancets please [name], NP  Active   Blood Glucose Monitoring Suppl (CONTOUR NEXT EZ) w/Device KIT [id number]  USE UP TO FOUR TIMES DAILY AS DIRECTED. (FOR ICD-10 E10.9, E11.9). [name], DO  Active   CONTOUR NEXT TEST test strip [id number]  USE AS DIRECTED UPTO 4 TIMES A [name], NP  Active   empagliflozin (JARDIANCE) 10 MG TABS tablet [id number] No Take 1 tablet (10 mg total) by mouth daily before breakfast.  Patient not taking: Reported on 10/12/2020   [name], NP Not Taking Active   hydrochlorothiazide (HYDRODIURIL) 25 MG tablet [id number] Yes Take 1 tablet (25 mg total) by mouth daily. Malfi, Lupita Raider, FNP Taking Active   insulin glargine (LANTUS SOLOSTAR) 100 UNIT/ML Solostar Pen [id number] Yes Inject 20 Units into the skin daily. [name], NP Taking Active   Insulin Pen Needle 32G X 4 MM MISC [id number]  1 Units by Does not apply route every morning. Pen needles [name], NP  Active   lisinopril (ZESTRIL) 20 MG tablet [id number] Yes Take 1 tablet (20 mg total) by mouth daily. Malfi, Lupita Raider, FNP Taking Active   metFORMIN (GLUCOPHAGE) 1000 MG tablet [id number] Yes Take 1 tablet (1,000 mg total) by mouth 2 (two) times daily with a meal. [name], NP Taking Active   tadalafil (CIALIS) 5 MG tablet [id number]  Take 1  tablet (5 mg total) by mouth daily as needed for erectile dysfunction. [name], FNP  Active             Patient Active Problem List   Diagnosis Date Noted   Overweight (BMI 25.0-29.9) 09/13/2020   COPD (chronic obstructive pulmonary disease) ([place]) 06/14/2020   Hyperlipidemia 03/23/2020   Erectile dysfunction 03/22/2020   Type 2 diabetes mellitus ([place]) 08/23/2017   Gastroesophageal reflux disease 12/07/2014   Essential hypertension 12/07/2014   History of rectal cancer 12/07/2014    Conditions to be addressed/monitored: HTN, HLD, and DMII  Care Plan : PharmD Medication Management  Updates made by Vella Raring, RPH-CPP since 10/12/2020 12:00 AM     Problem: Disease Progression      Long-Range Goal: Disease Progression Prevented or Minimized   Start Date: 06/06/2020  Expected End Date: 09/04/2020  This Visit's Progress: On track  Recent Progress: On track  Priority: High  Note:   Current Barriers:  Financial barriers Using copayment card from manufacturers for Jardiance and Lantus Lack of blood pressure results for clinical team  Pharmacist Clinical Goal(s):  Over the next 90 days, patient will achieve control of blood sugar as evidenced by A1C <7%  through collaboration with PharmD and provider.   Interventions: 1:1 collaboration with Webb Silversmith NP regarding development and update of comprehensive plan of care as evidenced by provider attestation and co-signature Inter-disciplinary care team collaboration (see longitudinal plan of care) Perform chart review. Patient seen for Office Visit with PCP on 6/21 POCT A1c 6.6%  T2DM: Controlled; current treatment: Metformin 1000 mg twice daily with meals Lantus 20 units daily in evening Jardiance 10 mg daily before breakfast Identify patient out of Jardiance for 3-4 days Report recent home fasting blood sugar results ranging 110-120 Have discussed s/s of low blood sugar and how to manage lows Have  counseled on importance of eating regular and well-balanced meals throughout the day. Working on eating breakfast once he gets to work Report has increased walking daily ~20 minutes (around yard and to [employer]).  Place call to [facility] with patient on the line to confirm refill available and resolve billing concern. Note: Rx must be billed for 30 day supply for patient to receive the 30 days for $10  Hypertension: Current treatment: HCTZ 25 mg daily Lisinopril 20 mg daily Identify patient not currently taking amlodipine 5 mg daily as has run out While on phone with [facility], request amlodipine refill Encourage patient to monitor BP and keep log of results to review during next appointment  Hyperlipidemia: Controlled; current treatment: Atorvastatin 20 mg daily  Medication Adherence: Note his wife helps to manage his medications including his Lantus nightly Confirms using weekly pillboxes as adherence aid. Advise patient to also use medication list from latest office visit when refilling pillbox to confirm has all medications.  Tobacco Abuse: Reported smokes ~1 ppd Have discussed benefits of smoking cessation   Patient Goals/Self-Care Activities Over the next 90 days, patient will:  - check glucose,  document, and provide at future appointments - check blood pressure, document, and provide at future appointments - attend medical appointments as scheduled  Follow Up Plan: Telephone follow up appointment with care management team member scheduled for: 10/26/2020 at 4:00 PM       Follow Up:  Patient agrees to Care Plan and Follow-up.  Wallace Cullens, PharmD, [place], CPP Clinical Pharmacist [facility] [phone]

## 2020-10-12 NOTE — Patient Instructions (Signed)
Visit Information  PATIENT GOALS:  Goals Addressed             This Visit's Progress    Pharmacy Goals       Our goal A1c is less than 7%. This corresponds with fasting sugars less than 130 and 2 hour after meal sugars less than 180. Please check your fasting blood sugar and keep a log of the results  Our goal bad cholesterol, or LDL, is less than 70 . This is why it is important to continue taking your atorvastatin  Our goal blood pressure is less than 130/80. Please check your blood pressure and keep log of the results.  Feel free to call me with any questions or concerns. I look forward to our next call!  Harlow Asa, PharmD, [place] [phone]        The patient verbalized understanding of instructions, educational materials, and care plan provided today and declined offer to receive copy of patient instructions, educational materials, and care plan.   Telephone follow up appointment with care management team member scheduled for: 10/26/2020 at 4:00 PM

## 2020-10-17 ENCOUNTER — Telehealth: Payer: Self-pay | Admitting: General Practice

## 2020-10-17 ENCOUNTER — Ambulatory Visit: Payer: Self-pay | Admitting: General Practice

## 2020-10-17 DIAGNOSIS — E782 Mixed hyperlipidemia: Secondary | ICD-10-CM

## 2020-10-17 DIAGNOSIS — I10 Essential (primary) hypertension: Secondary | ICD-10-CM

## 2020-10-17 DIAGNOSIS — E1165 Type 2 diabetes mellitus with hyperglycemia: Secondary | ICD-10-CM

## 2020-10-17 NOTE — Chronic Care Management (AMB) (Signed)
Care Management    RN Visit Note  10/17/2020 Name: Anthony Bell MRN: [id number] DOB: Feb 28, 1956  Subjective: Anthony Bell is a 65 y.o. year old male who is a primary care patient of Anthony Fenton, NP. The care management team was consulted for assistance with disease management and care coordination needs.    Engaged with patient by telephone for follow up visit in response to provider referral for case management and/or care coordination services.   Consent to Services:   Anthony Bell was given information about Care Management services today including:  Care Management services includes personalized support from designated clinical staff supervised by his physician, including individualized plan of care and coordination with other care providers 24/7 contact phone numbers for assistance for urgent and routine care needs. The patient may stop case management services at any time by phone call to the office staff.  Patient agreed to services and consent obtained.   Assessment: Review of patient past medical history, allergies, medications, health status, including review of consultants reports, laboratory and other test data, was performed as part of comprehensive evaluation and provision of chronic care management services.   SDOH (Social Determinants of Health) assessments and interventions performed:    Care Plan  No Known Allergies  Outpatient Encounter Medications as of 10/17/2020  Medication Sig   amLODipine (NORVASC) 5 MG tablet Take 1 tablet (5 mg total) by mouth daily.   empagliflozin (JARDIANCE) 10 MG TABS tablet Take 1 tablet (10 mg total) by mouth daily before breakfast.   aspirin EC 81 MG tablet Take 81 mg by mouth.    atorvastatin (LIPITOR) 20 MG tablet Take 1 tablet (20 mg total) by mouth daily.   blood glucose meter kit and supplies KIT Dispense based on patient and insurance preference. Use up to four times daily as directed. 100 strips and lancets please   Blood  Glucose Monitoring Suppl (CONTOUR NEXT EZ) w/Device KIT USE UP TO FOUR TIMES DAILY AS DIRECTED. (FOR ICD-10 E10.9, E11.9).   CONTOUR NEXT TEST test strip USE AS DIRECTED UPTO 4 TIMES A DAY   hydrochlorothiazide (HYDRODIURIL) 25 MG tablet Take 1 tablet (25 mg total) by mouth daily.   insulin glargine (LANTUS SOLOSTAR) 100 UNIT/ML Solostar Pen Inject 20 Units into the skin daily.   Insulin Pen Needle 32G X 4 MM MISC 1 Units by Does not apply route every morning. Pen needles   lisinopril (ZESTRIL) 20 MG tablet Take 1 tablet (20 mg total) by mouth daily.   metFORMIN (GLUCOPHAGE) 1000 MG tablet Take 1 tablet (1,000 mg total) by mouth 2 (two) times daily with a meal.   tadalafil (CIALIS) 5 MG tablet Take 1 tablet (5 mg total) by mouth daily as needed for erectile dysfunction.   No facility-administered encounter medications on file as of 10/17/2020.    Patient Active Problem List   Diagnosis Date Noted   Overweight (BMI 25.0-29.9) 09/13/2020   COPD (chronic obstructive pulmonary disease) ([facility]) 06/14/2020   Hyperlipidemia 03/23/2020   Erectile dysfunction 03/22/2020   Type 2 diabetes mellitus ([place]) 08/23/2017   Gastroesophageal reflux disease 12/07/2014   Essential hypertension 12/07/2014   History of rectal cancer 12/07/2014    Conditions to be addressed/monitored: HTN, HLD, and DMII  Care Plan : RNCM: Diabetes Type 2 (Adult)  Updates made by Vanita Ingles since 10/17/2020 12:00 AM     Problem: RNCM: Glycemic Management (Diabetes, Type 2)   Priority: Medium     Long-Range Goal: RNCM: Glycemic Management  Optimized   Start Date: 06/06/2020  Expected End Date: 08/13/2021  This Visit's Progress: On track  Priority: High  Note:   Objective:  Lab Results  Component Value Date   HGBA1C 6.6 (A) 09/13/2020    Lab Results  Component Value Date   CREATININE 1.21 06/02/2020   CREATININE 1.47 (H) 03/22/2020   CREATININE 1.55 (H) 02/02/2018   No results found for: EGFR Current Barriers:   Knowledge Deficits related to basic Diabetes pathophysiology and self care/management Knowledge Deficits related to medications used for management of diabetes Does not have glucometer to monitor blood sugar- meter ordered on 06-02-2020. 10-17-2020: The patient is consistently checking blood sugars at this time.  Limited Social Support Unable to independently manage DM Lacks social connections Does not maintain contact with provider office Does not contact provider office for questions/concerns Case Manager Clinical Goal(s):  patient will demonstrate improved adherence to prescribed treatment plan for diabetes self care/management as evidenced by: daily monitoring and recording of CBG  adherence to ADA/ carb modified diet adherence to prescribed medication regimen contacting provider for new or worsened symptoms or questions Interventions:  Collaboration with Anthony Fenton, NP regarding development and update of comprehensive plan of care as evidenced by provider attestation and co-signature Inter-disciplinary care team collaboration (see longitudinal plan of care) Provided education to patient about basic DM disease process. Education on checking blood sugars. The patient states his sister is supposed to bring him a meter. Co-collaboration with the pharmacist. Meter was ordered on 06-02-2020 by pcp. Pharm D will assist with meter use and medications management. Spoke with the patient about the importance of checking blood sugars and fasting blood sugar <130 and post prandial <180.  Also discussed goal of hemoglobin A1C of 7.0 or less. The patient verbalized understanding. The patient drinks Pepsi but has switched to water. Talked about no sugar options for drinks. The patient does not like diet pepsi. Offered other options for the patient to try. Expressed the benefits of controlled DM on organ systems. The patient is receptive to learning. 10-14-2020: The patient states that he is doing much better  with managing his DM. He is not drinking Pepsi any more except when he needs a little taste and he will open one and get a taste and be done with it. He is mostly always drinking water.  He waits until he gets to work to eat breakfast. Denies any lows at this time. Says that his blood sugars are staying consistently at 120. Praised for newest hemoglobin A1C of 6.6 on 6.21-2022.  Reviewed medications with patient and discussed importance of medication adherence. 08-08-2020: The patient is compliant with medications regimen. 10-17-2020: The patient states that he has his Jardiance now and he is compliant with all of his medications.  Discussed plans with patient for ongoing care management follow up and provided patient with direct contact information for care management team Provided patient with written educational materials related to hypo and hyperglycemia and importance of correct treatment. Reviewed with the patient the sx and sx and what to look for in changes. 10-17-2020: The patient states he is not having any low blood sugars. Review of sx and sx to look for. Will continue to monitor.  Reviewed scheduled/upcoming provider appointments including: pcp wants the patient to come back around 03-15-2021. Message sent to front office staff to call and make an appointment for annual physical for the patient. He will call for any other concerns. Advised patient, providing education and rationale, to  check cbg BID and record, calling pcp for findings outside established parameters.  08-08-2020: The patient states he is doing well. His blood sugar this am was 129. He will have new blood work in June. Expects A1C to be trending down. 10-17-2020: Consistently checking blood sugars and states average is around 120.   Referral made to pharmacy team for assistance with medication education, management, and help with the importance of taking blood sugars daily and managing blood sugars with medications and dietary  constraints. 10-17-2020: The patient is working with pharm D for medications management.  Review of patient status, including review of consultants reports, relevant laboratory and other test results, and medications completed. Self-Care Activities - UNABLE to independently manage DM as evidence of hemoglobin A1C of 14, 10-17-2020: Newest hemoglobin A1C of 6.6 on 09-13-2020- praised for accomplishments  Self administers oral medications as prescribed Attends all scheduled provider appointments Checks blood sugars as prescribed and utilize hyper and hypoglycemia protocol as needed Adheres to prescribed ADA/carb modified Patient Goals: - check blood sugar at prescribed times - check blood sugar before and after exercise - check blood sugar if I feel it is too high or too low - enter blood sugar readings and medication or insulin into daily log - take the blood sugar log to all doctor visits - take the blood sugar meter to all doctor visits - change to whole grain breads, cereal, pasta - drink 6 to 8 glasses of water each day - fill half of plate with vegetables - limit fast food meals to no more than 1 per week - manage portion size - prepare main meal at home 3 to 5 days each week - read food labels for fat, fiber, carbohydrates and portion size - switch to low-fat or skim milk - switch to sugar-free drinks - schedule appointment with eye doctor - check feet daily for cuts, sores or redness - do heel pump exercise 2 to 3 times each day - keep feet up while sitting - trim toenails straight across - wash and dry feet carefully every day - wear comfortable, cotton socks - wear comfortable, well-fitting shoes barriers to adherence to treatment plan identified - blood glucose monitoring encouraged - blood glucose readings reviewed - counseling by pharmacist provided- pharm D-co collaboration. Pharm D will contact the patient for help with effective management of DM and other chronic  conditions  - individualized medical nutrition therapy provided - mutual A1C goal set or reviewed - resources required to improve adherence to care identified - self-awareness of signs/symptoms of hypo or hyperglycemia encouraged - use of blood glucose monitoring log promoted- to send log by mail to the patient.    Follow Up Plan: Telephone follow up appointment with care management team member scheduled for: 12-15-2020 at 345 pm    Task: RNCM: Alleviate Barriers to Glycemic Management Completed 10/17/2020  Outcome: Positive  Note:   Care Management Activities:    - barriers to adherence to treatment plan identified - blood glucose monitoring encouraged - blood glucose readings reviewed - counseling by pharmacist provided- pharm D-co collaboration. Pharm D will contact the patient for help with effective management of DM and other chronic conditions  - individualized medical nutrition therapy provided - mutual A1C goal set or reviewed - resources required to improve adherence to care identified - self-awareness of signs/symptoms of hypo or hyperglycemia encouraged - use of blood glucose monitoring log promoted- to send log by mail to the patient.  Care Plan : RNCM: Hypertension (Adult)  Updates made by Vanita Ingles since 10/17/2020 12:00 AM     Problem: RNCM: Hypertension (Hypertension)   Priority: Medium     Long-Range Goal: RNCM: Hypertension Monitored   Start Date: 06/06/2020  Expected End Date: 08/13/2021  This Visit's Progress: On track  Priority: Medium  Note:   Objective:  Last practice recorded BP readings:  BP Readings from Last 3 Encounters:  10/17/20 (!) 120/40  09/13/20 (!) 121/49  07/12/20 129/60   Most recent eGFR/CrCl: No results found for: EGFR  No components found for: CRCL Current Barriers:  Knowledge Deficits related to basic understanding of hypertension pathophysiology and self care management Knowledge Deficits related to understanding of  medications prescribed for management of hypertension Difficulty obtaining medications Non-adherence to prescribed medication regimen Limited Social Designer, multimedia.  Unable to independently manage HTN Does not adhere to prescribed medication regimen Lacks social connections Does not contact provider office for questions/concerns Case Manager Clinical Goal(s):  patient will verbalize understanding of plan for hypertension management patient will attend all scheduled medical appointments: office to call and secure an appointment for 02-2021 patient will demonstrate improved adherence to prescribed treatment plan for hypertension as evidenced by taking all medications as prescribed, monitoring and recording blood pressure as directed, adhering to low sodium/DASH diet patient will demonstrate improved health management independence as evidenced by checking blood pressure as directed and notifying PCP if SBP>160 or DBP > 90, taking all medications as prescribe, and adhering to a low sodium diet as discussed. patient will verbalize basic understanding of hypertension disease process and self health management plan as evidenced by compliance with  heart healthy/ADA diet, compliance with medications and working with the CCM team to optimize health and well being.  Interventions:  Collaboration with Anthony Fenton, NP regarding development and update of comprehensive plan of care as evidenced by provider attestation and co-signature Inter-disciplinary care team collaboration (see longitudinal plan of care) UNABLE to independently:manage HTN effectively  Evaluation of current treatment plan related to hypertension self management and patient's adherence to plan as established by provider. 10-17-2020: Reviewed with the patient if the patient is taking amlodipine now and the patient confirms compliance. Education on taking medications and if he runs out to let the [facility] or pharm D know. The  patient verbalized understanding.  Provided education to patient re: stroke prevention, s/s of heart attack and stroke, DASH diet, complications of uncontrolled blood pressure Reviewed medications with patient and discussed importance of compliance Discussed plans with patient for ongoing care management follow up and provided patient with direct contact information for care management team Advised patient, providing education and rationale, to monitor blood pressure daily and record, calling PCP for findings outside established parameters.  Self-Care Activities: - Self administers medications as prescribed Attends all scheduled provider appointments Calls provider office for new concerns, questions, or BP outside discussed parameters Checks BP and records as discussed Follows a low sodium diet/DASH diet Patient Goals: - check blood pressure 3 times per week - choose a place to take my blood pressure (home, clinic or office, retail store) - write blood pressure results in a log or diary - agree on reward when goals are met - agree to work together to make changes - ask questions to understand - have a family meeting to talk about healthy habits - learn about high blood pressure  Follow Up Plan: Telephone follow up appointment with care management team member scheduled for: 12-15-2020 at  315 pm    Task: RNCM: Identify and Monitor Blood Pressure Elevation Completed 10/17/2020  Outcome: Positive  Note:   Care Management Activities:    - blood pressure trends reviewed - depression screen reviewed - home or ambulatory blood pressure monitoring encouraged        Care Plan : RNCM: HLD Management  Updates made by Vanita Ingles since 10/17/2020 12:00 AM     Problem: RNCM: Management of HLD   Priority: Medium     Long-Range Goal: RNCM: Management of HLD   Start Date: 06/06/2020  Expected End Date: 08/13/2021  This Visit's Progress: On track  Priority: Medium  Note:   Current  Barriers:  Poorly controlled hyperlipidemia, complicated by uncontrolled DM Current antihyperlipidemic regimen: Lipitor 20 Mg QD Most recent lipid panel:  Lab Results  Component Value Date   CHOL 125 09/20/2020   CHOL 235 (H) 03/22/2020   Lab Results  Component Value Date   HDL 34 (L) 09/20/2020   HDL 36 (L) 03/22/2020   Lab Results  Component Value Date   LDLCALC 70 09/20/2020   LDLCALC 158 (H) 03/22/2020   Lab Results  Component Value Date   TRIG 119 09/20/2020   TRIG 242 (H) 03/22/2020   Lab Results  Component Value Date   CHOLHDL 3.7 09/20/2020   CHOLHDL 6.5 (H) 03/22/2020   No results found for: LDLDIRECT  ASCVD risk enhancing conditions: age 42,  DM, HTN, current smoker Unable to independently manage chronic conditons  Lacks social connections Does not maintain contact with provider office Does not contact provider office for questions/concerns RN Care Manager Clinical Goal(s):  patient will work with Consulting civil engineer, providers, and care team towards execution of optimized self-health management plan patient will verbalize understanding of plan for effective management of HLD  patient will work with [facility] team  to address needs related to HLD  patient will attend all scheduled medical appointments: 06-07-2020 Interventions: Collaboration with Anthony Fenton, NP regarding development and update of comprehensive plan of care as evidenced by provider attestation and co-signature Inter-disciplinary care team collaboration (see longitudinal plan of care) Medication review performed; medication list updated in electronic medical record.  Inter-disciplinary care team collaboration (see longitudinal plan of care) Referred to pharmacy team for assistance with HLD medication management Evaluation of current treatment plan related to HLD  and patient's adherence to plan as established by provider. 10-17-2020: The patient is doing well and denies any issues related to HLD.  States he is feeling great and eating good. He is being mindful of his health and is so thankful for the CCM team checking on him.  Advised patient to call the office for changes or questions Provided education to patient re: heart healthy/ADA diet. 10-17-2020: States compliance with heart healthy/ADA diet. Denies any new concerns at this time.  Reviewed medications with patient and discussed compliance/cost constraints/questions. 10-17-2020: States that he is taking medications as prescribed  Provided patient with dietary restriction  educational materials related to Heart healthy/ADA diet and the benefits of controlling chronic conditions to promote health and well being.  Reviewed scheduled/upcoming provider appointments including: office to call with appointment for 02-2021 Discussed plans with patient for ongoing care management follow up and provided patient with direct contact information for care management team Patient Goals/Self-Care Activities: - call for medicine refill 2 or 3 days before it runs out - call if I am sick and can't take my medicine - keep a list of all the  medicines I take; vitamins and herbals too - learn to read medicine labels - use a pillbox to sort medicine - use an alarm clock or phone to remind me to take my medicine - change to whole grain breads, cereal, pasta - drink 6 to 8 glasses of water each day - eat 3 to 5 servings of fruits and vegetables each day - eat 5 or 6 small meals each day - fill half the plate with nonstarchy vegetables - limit fast food meals to no more than 1 per week - manage portion size - prepare main meal at home 3 to 5 days each week - read food labels for fat, fiber, carbohydrates and portion size - switch to low-fat or skim milk - switch to sugar-free drinks - be open to making changes - I can manage, know and watch for signs of a heart attack - if I have chest pain, call for help - learn about small changes that will make a big  difference - learn my personal risk factors  Follow Up Plan: Telephone follow up appointment with care management team member scheduled for: 12-15-2020 at 345 pm      Task: Mutually Develop and Royce Macadamia Achievement of Patient Goals Completed 10/17/2020  Outcome: Positive  Note:   Care Management Activities:    - barriers to meeting goals identified - change-talk evoked - choices provided - collaboration with team encouraged - decision-making supported - difficulty of making life-long changes acknowledged - health risks reviewed - problem-solving facilitated - questions answered - readiness for change evaluated - reassurance provided - resources needed to meet goals identified    Notes:      Plan: Telephone follow up appointment with care management team member scheduled for:  12-15-2020 at 345 pm  Noreene Larsson RN, MSN, [facility] Mobile: [phone]

## 2020-10-17 NOTE — Patient Instructions (Signed)
Visit Information   Goals Addressed             This Visit's Progress    RNCM: Monitor and Manage My Blood Sugar-Diabetes Type 2       Timeframe:  Long-Range Goal Priority:  High Start Date:       06-06-2020                      Expected End Date:      11-22-2021                Follow Up Date 12-15-2020   - check blood sugar at prescribed times - check blood sugar before and after exercise - check blood sugar if I feel it is too high or too low - enter blood sugar readings and medication or insulin into daily log - take the blood sugar log to all doctor visits - take the blood sugar meter to all doctor visits    Why is this important?   Checking your blood sugar at home helps to keep it from getting very high or very low.  Writing the results in a diary or log helps the doctor know how to care for you.  Your blood sugar log should have the time, date and the results.  Also, write down the amount of insulin or other medicine that you take.  Other information, like what you ate, exercise done and how you were feeling, will also be helpful.     Notes: Encouraged the patient to start taking blood sugars regularly. 08-08-2020: The patient is taking more regularly now. 10-17-2020: The patient states his blood sugars are staying around 120's. Last hemoglobin A1C was 6.6 on 09-13-2020. Praised the patient for checking and recording blood sugars.         The patient verbalized understanding of instructions, educational materials, and care plan provided today and declined offer to receive copy of patient instructions, educational materials, and care plan.   Telephone follow up appointment with care management team member scheduled for: 12-15-2020 345 pm  Noreene Larsson RN, MSN, [facility] Mobile: [phone]

## 2020-10-26 ENCOUNTER — Telehealth: Payer: Self-pay

## 2020-10-26 ENCOUNTER — Telehealth: Payer: Self-pay | Admitting: Pharmacist

## 2020-10-26 NOTE — Telephone Encounter (Signed)
  Chronic Care Management   Outreach Note  10/26/2020 Name: Anthony Bell MRN: [id number] DOB: April 18, 1955  Referred by: [name], NP Reason for referral : No chief complaint on file.   Was unable to reach patient via telephone today and have left HIPAA compliant voicemail asking patient to return my call.    Follow Up Plan: Will collaborate with Care Guide to outreach to schedule follow up with me  Harlow Asa, PharmD, [place] [facility] [phone]

## 2020-11-02 ENCOUNTER — Other Ambulatory Visit: Payer: Self-pay | Admitting: Internal Medicine

## 2020-11-02 DIAGNOSIS — I10 Essential (primary) hypertension: Secondary | ICD-10-CM

## 2020-11-02 MED ORDER — HYDROCHLOROTHIAZIDE 25 MG PO TABS: 25.0000 mg | ORAL_TABLET | Freq: Every day | ORAL | 0 refills | Status: DC

## 2020-11-02 NOTE — Telephone Encounter (Signed)
Medication Refill - Medication: hydrochlorothiazide (HYDRODIURIL) 25 MG tablet   Has the patient contacted their pharmacy? Yes.  Preferred Pharmacy (with phone number or street name):  [id number] [address]  Phone:  [phone]:  [phone] Agent: Please be advised that RX refills may take up to 3 business days. We ask that you follow-up with your pharmacy.

## 2020-11-03 ENCOUNTER — Other Ambulatory Visit: Payer: Self-pay

## 2020-11-03 DIAGNOSIS — I10 Essential (primary) hypertension: Secondary | ICD-10-CM

## 2020-11-03 MED ORDER — LISINOPRIL 20 MG PO TABS: 20.0000 mg | ORAL_TABLET | Freq: Every day | ORAL | 1 refills | Status: DC

## 2020-11-14 ENCOUNTER — Ambulatory Visit: Payer: BC Managed Care – PPO | Admitting: Pharmacist

## 2020-11-14 DIAGNOSIS — E782 Mixed hyperlipidemia: Secondary | ICD-10-CM

## 2020-11-14 DIAGNOSIS — Z720 Tobacco use: Secondary | ICD-10-CM

## 2020-11-14 DIAGNOSIS — E1165 Type 2 diabetes mellitus with hyperglycemia: Secondary | ICD-10-CM

## 2020-11-14 DIAGNOSIS — I10 Essential (primary) hypertension: Secondary | ICD-10-CM

## 2020-11-14 NOTE — Patient Instructions (Signed)
Thank you allowing the Care Management Team to be a part of your care! It was a pleasure speaking with you today!     Care Management Team    Noreene Larsson RN, MSN, CCM Nurse Care Coordinator  [phone]   Harlow Asa PharmD  Clinical Pharmacist  [phone]   Christa See, MSW, LCSW Clinical Social Worker [phone]   Visit Information   Goals Addressed             This Visit's Progress    Pharmacy Goals       Our goal A1c is less than 7%. This corresponds with fasting sugars less than 130 and 2 hour after meal sugars less than 180. Please check your fasting blood sugar and keep a log of the results  Our goal bad cholesterol, or LDL, is less than 70 . This is why it is important to continue taking your atorvastatin  Our goal blood pressure is less than 130/80. Please check your blood pressure and keep log of the results.  Feel free to call me with any questions or concerns. I look forward to our next call!  Wallace Cullens, PharmD, [place] [phone]        The patient verbalized understanding of instructions, educational materials, and care plan provided today and declined offer to receive copy of patient instructions, educational materials, and care plan.   Telephone follow up appointment with care management team member scheduled for: 8/29 at 4 pm

## 2020-11-14 NOTE — Chronic Care Management (AMB) (Signed)
Care Management   Pharmacy Note  11/14/2020 Name: Anthony Bell MRN: [id number] DOB: [birth year]  Subjective: Anthony Bell is a 65 y.o. year old male who is a primary care patient of [name], NP. The Care Management team was consulted for assistance with care management and care coordination needs.    Engaged with patient by telephone for follow up visit in response to provider referral for pharmacy case management and/or care coordination services.   The patient was given information about Care Management services today including:  Care Management services includes personalized support from designated clinical staff supervised by the patient's primary care provider, including individualized plan of care and coordination with other care providers. 24/7 contact phone numbers for assistance for urgent and routine care needs. The patient may stop case management services at any time by phone call to the office staff.  Patient agreed to services and consent obtained.  Assessment:  Review of patient status, including review of consultants reports, laboratory and other test data, was performed as part of comprehensive evaluation and provision of chronic care management services.   SDOH (Social Determinants of Health) assessments and interventions performed: yes SDOH Interventions    Flowsheet Row Most Recent Value  SDOH Interventions   SDOH Interventions for the Following Domains Tobacco  Tobacco Interventions --  [Discuss benefits of smoking cessation]        Objective:  Lab Results  Component Value Date   CREATININE 1.26 (H) 09/20/2020   CREATININE 1.21 06/02/2020   CREATININE 1.47 (H) 03/22/2020    Lab Results  Component Value Date   HGBA1C 6.6 (A) 09/13/2020       Component Value Date/Time   CHOL 125 09/20/2020 0750   TRIG 119 09/20/2020 0750   HDL 34 (L) 09/20/2020 0750   CHOLHDL 3.7 09/20/2020 0750   LDLCALC 70 09/20/2020 0750     BP Readings from Last  3 Encounters:  10/17/20 (!) 120/40  09/13/20 (!) 121/49  07/12/20 129/60    Care Plan  No Known Allergies  Medications Reviewed Today     Reviewed by Vanita Ingles on 10/17/20 at 1600  Med List Status: <None>   Medication Order Taking? Sig Documenting Provider Last Dose Status Informant  amLODipine (NORVASC) 5 MG tablet [id number]  Take 1 tablet (5 mg total) by mouth daily. [name], NP  Active   aspirin EC 81 MG tablet [id number]  Take 81 mg by mouth.  [provider]  Active Self  atorvastatin (LIPITOR) 20 MG tablet [id number]  Take 1 tablet (20 mg total) by mouth daily. [name], FNP  Active   blood glucose meter kit and supplies KIT [id number]  Dispense based on patient and insurance preference. Use up to four times daily as directed. 100 strips and lancets please [name], NP  Active   Blood Glucose Monitoring Suppl (CONTOUR NEXT EZ) w/Device KIT [id number]  USE UP TO FOUR TIMES DAILY AS DIRECTED. (FOR ICD-10 E10.9, E11.9). [name], DO  Active   CONTOUR NEXT TEST test strip [id number]  USE AS DIRECTED UPTO 4 TIMES A [name], NP  Active   empagliflozin (JARDIANCE) 10 MG TABS tablet [id number]  Take 1 tablet (10 mg total) by mouth daily before breakfast.  Patient not taking: Reported on 10/12/2020   [name], NP  Active   hydrochlorothiazide (HYDRODIURIL) 25 MG tablet [id number]  Take 1 tablet (25 mg total) by mouth daily. Malfi, Lupita Raider, FNP  Active  insulin glargine (LANTUS SOLOSTAR) 100 UNIT/ML Solostar Pen [id number]  Inject 20 Units into the skin daily. [name], NP  Active   Insulin Pen Needle 32G X 4 MM MISC [id number]  1 Units by Does not apply route every morning. Pen needles [name], NP  Active   lisinopril (ZESTRIL) 20 MG tablet [id number]  Take 1 tablet (20 mg total) by mouth daily. [name], FNP  Active   metFORMIN (GLUCOPHAGE) 1000 MG tablet [id number]  Take 1 tablet (1,000 mg total) by mouth 2  (two) times daily with a meal. [name], NP  Active   tadalafil (CIALIS) 5 MG tablet [id number]  Take 1 tablet (5 mg total) by mouth daily as needed for erectile dysfunction. [name], FNP  Active             Patient Active Problem List   Diagnosis Date Noted   Overweight (BMI 25.0-29.9) 09/13/2020   COPD (chronic obstructive pulmonary disease) ([place]) 06/14/2020   Hyperlipidemia 03/23/2020   Erectile dysfunction 03/22/2020   Type 2 diabetes mellitus ([place]) 08/23/2017   Gastroesophageal reflux disease 12/07/2014   Essential hypertension 12/07/2014   History of rectal cancer 12/07/2014    Conditions to be addressed/monitored: HTN, HLD, and DMII  Care Plan : PharmD Medication Management  Updates made by [name], RPH-CPP since 11/14/2020 12:00 AM     Problem: Disease Progression      Long-Range Goal: Disease Progression Prevented or Minimized   Start Date: 06/06/2020  Expected End Date: 09/04/2020  This Visit's Progress: On track  Recent Progress: On track  Priority: High  Note:   Current Barriers:  Financial barriers Using copayment card from manufacturers for Jardiance and Lantus Lack of blood pressure results for clinical team  Pharmacist Clinical Goal(s):  Over the next 90 days, patient will achieve control of blood sugar as evidenced by A1C <7%  through collaboration with PharmD and provider.   Interventions: 1:1 collaboration with Webb Silversmith NP regarding development and update of comprehensive plan of care as evidenced by provider attestation and co-signature Inter-disciplinary care team collaboration (see longitudinal plan of care) Reports he is thinking about reducing his hours at work soon  T2DM: Controlled; current treatment: Metformin 1000 mg twice daily with meals Lantus 20 units daily in evening Jardiance 10 mg daily before breakfast Confirms picked up and restarted taking Jardiance daily Report recent home fasting blood sugar  results ranging 98-120 Denies s/s of hypoglycemia Have discussed s/s of low blood sugar and how to manage lows Discuss importance of eating regular and well-balanced meals throughout the day. Working on eating breakfast once he gets to work Report continuing to walk ~20 minutes daily (around yard and to [employer]).  Reports planning to pick up refill of Lantus in the next week. Remind patient to ask pharmacy to split bill Rx to Lantus copay assistance card   Hypertension: Current treatment: HCTZ 25 mg daily Lisinopril 20 mg daily amlodipine 5 mg daily Confirms picked up and restarted taking amlodipine Reports has BP checked at work and results have been "good", but denies keeping record Denies symptoms of hypotension or hypertension Encourage patient to continue to have BP checked, keep a log of the results and have this record for [place] to review during our calls Patient asks if I can call him next week to review his BP readings. Schedule as requested   Hyperlipidemia: Controlled; current treatment: Atorvastatin 20 mg daily   Medication Adherence: Note his wife helps to  manage his medications including his Lantus nightly Confirms using weekly pillboxes as adherence aid. Have advised patient to also use medication list from latest office visit when refilling pillbox to confirm has all medications.   Tobacco Abuse: Reported smokes ~1 ppd Discussed benefits of smoking cessation Reports has been thinking about quitting, but would want to do this with his wife   Patient Goals/Self-Care Activities Over the next 90 days, patient will:  - check glucose, document, and provide at future appointments - check blood pressure, document, and provide at future appointments - attend medical appointments as scheduled  Follow Up Plan: Telephone follow up appointment with care management team member scheduled for: 11/21/2020 at 4:00 PM       Follow Up:  Patient agrees to Care Plan and  Follow-up.  Wallace Cullens, PharmD, [place], CPP Clinical Pharmacist [facility] [phone]

## 2020-11-21 ENCOUNTER — Ambulatory Visit: Payer: BC Managed Care – PPO | Admitting: Pharmacist

## 2020-11-21 DIAGNOSIS — I10 Essential (primary) hypertension: Secondary | ICD-10-CM

## 2020-11-21 DIAGNOSIS — E1165 Type 2 diabetes mellitus with hyperglycemia: Secondary | ICD-10-CM

## 2020-11-21 NOTE — Patient Instructions (Signed)
Visit Information  PATIENT GOALS:  Goals Addressed             This Visit's Progress    Pharmacy Goals       Our goal A1c is less than 7%. This corresponds with fasting sugars less than 130 and 2 hour after meal sugars less than 180. Please check your fasting blood sugar and keep a log of the results  Our goal bad cholesterol, or LDL, is less than 70 . This is why it is important to continue taking your atorvastatin  Our goal blood pressure is less than 130/80. Please check your blood pressure and keep log of the results.  Feel free to call me with any questions or concerns. I look forward to our next call!   Wallace Cullens, PharmD, [place] [phone]        The patient verbalized understanding of instructions, educational materials, and care plan provided today and declined offer to receive copy of patient instructions, educational materials, and care plan.   Telephone follow up appointment with care management team member scheduled for: 10/5 at 4 pm

## 2020-11-21 NOTE — Chronic Care Management (AMB) (Signed)
Care Management   Pharmacy Note  11/21/2020 Name: Anthony Bell MRN: [id number] DOB: 1955-08-03  Subjective: Anthony Bell is a 65 y.o. year old male who is a primary care patient of [name], NP. The Care Management team was consulted for assistance with care management and care coordination needs.    Engaged with patient by telephone for follow up visit in response to provider referral for pharmacy case management and/or care coordination services.   The patient was given information about Care Management services today including:  Care Management services includes personalized support from designated clinical staff supervised by the patient's primary care provider, including individualized plan of care and coordination with other care providers. 24/7 contact phone numbers for assistance for urgent and routine care needs. The patient may stop case management services at any time by phone call to the office staff.  Patient agreed to services and consent obtained.  Assessment:  Review of patient status, including review of consultants reports, laboratory and other test data, was performed as part of comprehensive evaluation and provision of chronic care management services.   SDOH (Social Determinants of Health) assessments and interventions performed:  none  Objective:  Lab Results  Component Value Date   CREATININE 1.26 (H) 09/20/2020   CREATININE 1.21 06/02/2020   CREATININE 1.47 (H) 03/22/2020    Lab Results  Component Value Date   HGBA1C 6.6 (A) 09/13/2020       Component Value Date/Time   CHOL 125 09/20/2020 0750   TRIG 119 09/20/2020 0750   HDL 34 (L) 09/20/2020 0750   CHOLHDL 3.7 09/20/2020 0750   LDLCALC 70 09/20/2020 0750    BP Readings from Last 3 Encounters:  10/17/20 (!) 120/40  09/13/20 (!) 121/49  07/12/20 129/60    Care Plan  No Known Allergies  Medications Reviewed Today     Reviewed by Vanita Ingles (Case Manager) on 10/17/20 at  1600  Med List Status: <None>   Medication Order Taking? Sig Documenting Provider Last Dose Status Informant  amLODipine (NORVASC) 5 MG tablet [id number]  Take 1 tablet (5 mg total) by mouth daily. [name], NP  Active   aspirin EC 81 MG tablet [id number]  Take 81 mg by mouth.  [provider]  Active Self  atorvastatin (LIPITOR) 20 MG tablet [id number]  Take 1 tablet (20 mg total) by mouth daily. [name], FNP  Active   blood glucose meter kit and supplies KIT [id number]  Dispense based on patient and insurance preference. Use up to four times daily as directed. 100 strips and lancets please [name], NP  Active   Blood Glucose Monitoring Suppl (CONTOUR NEXT EZ) w/Device KIT [id number]  USE UP TO FOUR TIMES DAILY AS DIRECTED. (FOR ICD-10 E10.9, E11.9). [name], DO  Active   CONTOUR NEXT TEST test strip [id number]  USE AS DIRECTED UPTO 4 TIMES A [name], NP  Active   empagliflozin (JARDIANCE) 10 MG TABS tablet [id number]  Take 1 tablet (10 mg total) by mouth daily before breakfast.  Patient not taking: Reported on 10/12/2020   [name], NP  Active   hydrochlorothiazide (HYDRODIURIL) 25 MG tablet [id number]  Take 1 tablet (25 mg total) by mouth daily. Malfi, Lupita Raider, FNP  Active   insulin glargine (LANTUS SOLOSTAR) 100 UNIT/ML Solostar Pen [id number]  Inject 20 Units into the skin daily. [name], NP  Active   Insulin Pen Needle 32G X 4 MM MISC [id number]  1  Units by Does not apply route every morning. Pen needles [name], NP  Active   lisinopril (ZESTRIL) 20 MG tablet [id number]  Take 1 tablet (20 mg total) by mouth daily. [name], FNP  Active   metFORMIN (GLUCOPHAGE) 1000 MG tablet [id number]  Take 1 tablet (1,000 mg total) by mouth 2 (two) times daily with a meal. [name], NP  Active   tadalafil (CIALIS) 5 MG tablet [id number]  Take 1 tablet (5 mg total) by mouth daily as needed for erectile dysfunction. [name], FNP  Active             Patient Active Problem List   Diagnosis Date Noted   Overweight (BMI 25.0-29.9) 09/13/2020   COPD (chronic obstructive pulmonary disease) ([place]) 06/14/2020   Hyperlipidemia 03/23/2020   Erectile dysfunction 03/22/2020   Type 2 diabetes mellitus ([place]) 08/23/2017   Gastroesophageal reflux disease 12/07/2014   Essential hypertension 12/07/2014   History of rectal cancer 12/07/2014    Conditions to be addressed/monitored: HTN, HLD, and DMII  Care Plan : PharmD Medication Management  Updates made by [name], RPH-CPP since 11/21/2020 12:00 AM     Problem: Disease Progression      Long-Range Goal: Disease Progression Prevented or Minimized   Start Date: 06/06/2020  Expected End Date: 09/04/2020  This Visit's Progress: On track  Recent Progress: On track  Priority: High  Note:   Current Barriers:  Financial barriers Using copayment card from manufacturers for Jardiance and Lantus Lack of blood pressure results for clinical team  Pharmacist Clinical Goal(s):  Over the next 90 days, patient will achieve control of blood sugar as evidenced by A1C <7%  through collaboration with PharmD and provider.   Interventions: 1:1 collaboration with Webb Silversmith NP regarding development and update of comprehensive plan of care as evidenced by provider attestation and co-signature Inter-disciplinary care team collaboration (see longitudinal plan of care)  Hypertension: Current treatment: HCTZ 25 mg daily Lisinopril 20 mg daily amlodipine 5 mg daily Reports BP results from work: 8/26: 120/60  Today: 120/70  Denies symptoms of hypotension or hypertension Encourage patient to continue to have BP checked, keep a log of the results (including heart rate) and have this record for [place] to review during our calls   T2DM: Controlled; current treatment: Metformin 1000 mg twice daily with meals Lantus 20 units daily in evening Jardiance 10 mg daily  before breakfast Have discussed s/s of low blood sugar and how to manage lows Discuss importance of eating regular and well-balanced meals throughout the day. Working on eating breakfast once he gets to work Report continuing to walk ~20 minutes daily (around yard and to [employer]).  Reports will pick up refill of Lantus this week. Remind patient to ask pharmacy to split bill Rx to Lantus copay assistance card   Hyperlipidemia: Controlled; current treatment: Atorvastatin 20 mg daily   Medication Adherence: Note his wife helps to manage his medications including his Lantus nightly Patient using weekly pillboxes as adherence aid. Have advised patient to also use medication list from latest office visit when refilling pillbox to confirm has all medications.   Tobacco Abuse: Reported smokes ~1 ppd Discussed benefits of smoking cessation Reports has been thinking about quitting, but would want to do this with his wife   Patient Goals/Self-Care Activities Over the next 90 days, patient will:  - check glucose, document, and provide at future appointments - check blood pressure, document, and provide at future appointments -  attend medical appointments as scheduled  Follow Up Plan: Telephone follow up appointment with care management team member scheduled for: 10/5 at 4 pm      Follow Up:  Patient agrees to Care Plan and Follow-up.  Wallace Cullens, PharmD, [place], CPP Clinical Pharmacist [facility] [phone]

## 2020-12-15 ENCOUNTER — Ambulatory Visit: Payer: Self-pay

## 2020-12-15 ENCOUNTER — Telehealth: Payer: BC Managed Care – PPO | Admitting: General Practice

## 2020-12-15 DIAGNOSIS — I10 Essential (primary) hypertension: Secondary | ICD-10-CM

## 2020-12-15 DIAGNOSIS — E1165 Type 2 diabetes mellitus with hyperglycemia: Secondary | ICD-10-CM

## 2020-12-15 DIAGNOSIS — E782 Mixed hyperlipidemia: Secondary | ICD-10-CM

## 2020-12-15 NOTE — Patient Instructions (Signed)
Visit Information   Goals Addressed             This Visit's Progress    RNCM: Monitor and Manage My Blood Sugar-Diabetes Type 2       Timeframe:  Long-Range Goal Priority:  High Start Date:       06-06-2020                      Expected End Date:      11-22-2021                Follow Up Date 01-12-2021   - check blood sugar at prescribed times - check blood sugar before and after exercise - check blood sugar if I feel it is too high or too low - enter blood sugar readings and medication or insulin into daily log - take the blood sugar log to all doctor visits - take the blood sugar meter to all doctor visits    Why is this important?   Checking your blood sugar at home helps to keep it from getting very high or very low.  Writing the results in a diary or log helps the doctor know how to care for you.  Your blood sugar log should have the time, date and the results.  Also, write down the amount of insulin or other medicine that you take.  Other information, like what you ate, exercise done and how you were feeling, will also be helpful.     Notes: Encouraged the patient to start taking blood sugars regularly. 08-08-2020: The patient is taking more regularly now. 10-17-2020: The patient states his blood sugars are staying around 120's. Last hemoglobin A1C was 6.6 on 09-13-2020. Praised the patient for checking and recording blood sugars. 12-15-2020: The patient states his blood sugars are doing well. The patient does state he has a problem however. He went to pick up his Lantus and told them what the pharm D told him to tell the pharmacy and was told he would have to pay out of pocket. The patient states he could not do that and he did not get the Lantus. The patient has not taken the Lantus x 2 days. RNCM made a call to [facility] and spoke to the pharmacist. The patients insurance does not perfer Lantus now and that is why he was told what he was. The insurance prefers Rudolpho Sevin now.  Secure chat sent to the pcp, CMA, pharm D on call for direction. Will continue to monitor.         The patient verbalized understanding of instructions, educational materials, and care plan provided today and declined offer to receive copy of patient instructions, educational materials, and care plan.   Telephone follow up appointment with care management team member scheduled for: 01-12-2021 at 230 pm and will follow up with the patient after collaboration with the pcp concerning DM medications.   Noreene Larsson RN, MSN, [place] Mobile: [phone]

## 2020-12-15 NOTE — Chronic Care Management (AMB) (Signed)
Care Management    RN Visit Note  12/15/2020 Name: Anthony Bell MRN: [id number] DOB: 1955/06/17  Subjective: Anthony Bell is a 65 y.o. year old male who is a primary care patient of Anthony Fenton, NP. The care management team was consulted for assistance with disease management and care coordination needs.    Engaged with patient by telephone for follow up visit in response to provider referral for case management and/or care coordination services.   Consent to Services:   Anthony Bell was given information about Care Management services today including:  Care Management services includes personalized support from designated clinical staff supervised by his physician, including individualized plan of care and coordination with other care providers 24/7 contact phone numbers for assistance for urgent and routine care needs. The patient may stop case management services at any time by phone call to the office staff.  Patient agreed to services and consent obtained.   Assessment: Review of patient past medical history, allergies, medications, health status, including review of consultants reports, laboratory and other test data, was performed as part of comprehensive evaluation and provision of chronic care management services.   SDOH (Social Determinants of Health) assessments and interventions performed:    Care Plan  No Known Allergies  Outpatient Encounter Medications as of 12/15/2020  Medication Sig   amLODipine (NORVASC) 5 MG tablet Take 1 tablet (5 mg total) by mouth daily.   aspirin EC 81 MG tablet Take 81 mg by mouth.    atorvastatin (LIPITOR) 20 MG tablet Take 1 tablet (20 mg total) by mouth daily.   blood glucose meter kit and supplies KIT Dispense based on patient and insurance preference. Use up to four times daily as directed. 100 strips and lancets please   Blood Glucose Monitoring Suppl (CONTOUR NEXT EZ) w/Device KIT USE UP TO FOUR TIMES DAILY AS DIRECTED. (FOR ICD-10  E10.9, E11.9).   CONTOUR NEXT TEST test strip USE AS DIRECTED UPTO 4 TIMES A DAY   empagliflozin (JARDIANCE) 10 MG TABS tablet Take 1 tablet (10 mg total) by mouth daily before breakfast.   hydrochlorothiazide (HYDRODIURIL) 25 MG tablet Take 1 tablet (25 mg total) by mouth daily.   insulin glargine (LANTUS SOLOSTAR) 100 UNIT/ML Solostar Pen Inject 20 Units into the skin daily.   Insulin Pen Needle 32G X 4 MM MISC 1 Units by Does not apply route every morning. Pen needles   lisinopril (ZESTRIL) 20 MG tablet Take 1 tablet (20 mg total) by mouth daily.   metFORMIN (GLUCOPHAGE) 1000 MG tablet Take 1 tablet (1,000 mg total) by mouth 2 (two) times daily with a meal.   tadalafil (CIALIS) 5 MG tablet Take 1 tablet (5 mg total) by mouth daily as needed for erectile dysfunction.   No facility-administered encounter medications on file as of 12/15/2020.    Patient Active Problem List   Diagnosis Date Noted   Overweight (BMI 25.0-29.9) 09/13/2020   COPD (chronic obstructive pulmonary disease) ([place]) 06/14/2020   Hyperlipidemia 03/23/2020   Erectile dysfunction 03/22/2020   Type 2 diabetes mellitus ([place]) 08/23/2017   Gastroesophageal reflux disease 12/07/2014   Essential hypertension 12/07/2014   History of rectal cancer 12/07/2014    Conditions to be addressed/monitored: HTN, HLD, and DMII  Care Plan : RNCM: Diabetes Type 2 (Adult)  Updates made by Vanita Ingles, RN since 12/15/2020 12:00 AM     Problem: RNCM: Glycemic Management (Diabetes, Type 2)   Priority: Medium     Long-Range Goal: RNCM: Glycemic  Management Optimized   Start Date: 06/06/2020  Expected End Date: 08/13/2021  This Visit's Progress: On track  Recent Progress: On track  Priority: High  Note:   Objective:  Lab Results  Component Value Date   HGBA1C 6.6 (A) 09/13/2020    Lab Results  Component Value Date   CREATININE 1.21 06/02/2020   CREATININE 1.47 (H) 03/22/2020   CREATININE 1.55 (H) 02/02/2018   No results  found for: EGFR Current Barriers:  Knowledge Deficits related to basic Diabetes pathophysiology and self care/management Knowledge Deficits related to medications used for management of diabetes Does not have glucometer to monitor blood sugar- meter ordered on 06-02-2020. 10-17-2020: The patient is consistently checking blood sugars at this time.  Limited Social Support Unable to independently manage DM Lacks social connections Does not maintain contact with provider office Does not contact provider office for questions/concerns Out of Lantus, went to pharmacy to get and they told him he would have to pay out of pocket- Has been out of Lantus x 2 days (12-15-2020) Case Manager Clinical Goal(s):  patient will demonstrate improved adherence to prescribed treatment plan for diabetes self care/management as evidenced by: daily monitoring and recording of CBG  adherence to ADA/ carb modified diet adherence to prescribed medication regimen contacting provider for new or worsened symptoms or questions Interventions:  Collaboration with Anthony Fenton, NP regarding development and update of comprehensive plan of care as evidenced by provider attestation and co-signature Inter-disciplinary care team collaboration (see longitudinal plan of care) Provided education to patient about basic DM disease process. Education on checking blood sugars. The patient states his sister is supposed to bring him a meter. Co-collaboration with the pharmacist. Meter was ordered on 06-02-2020 by pcp. Pharm D will assist with meter use and medications management. Spoke with the patient about the importance of checking blood sugars and fasting blood sugar <130 and post prandial <180.  Also discussed goal of hemoglobin A1C of 7.0 or less. The patient verbalized understanding. The patient drinks Pepsi but has switched to water. Talked about no sugar options for drinks. The patient does not like diet pepsi. Offered other options for the  patient to try. Expressed the benefits of controlled DM on organ systems. The patient is receptive to learning. 10-14-2020: The patient states that he is doing much better with managing his DM. He is not drinking Pepsi any more except when he needs a little taste and he will open one and get a taste and be done with it. He is mostly always drinking water.  He waits until he gets to work to eat breakfast. Denies any lows at this time. Says that his blood sugars are staying consistently at 120. Praised for newest hemoglobin A1C of 6.6 on 6.21-2022. 12-15-2020: The patient states that he is doing well but has a problem and was not able to get his insulin (Lantus), The patient went to the pharmacy and advised the to split pay the Lantus and they told him he would have to pay out of pocket. The patient states that he could not do this and did not get the Lantus.  The patient wanted to know if he could get samples or what he needed to do. The RNCM contacted the office by secure chat. Pcp has left for the day but CMA advised that the office did not have any samples of Lantus. The RNCM called [facility] and ask about the issue with the Lantus and was told that his insurance does not  prefer Lantus now but [place] Information provided to the office staff. Will wait to hear back from the pcp for the next steps for the patient. Call made back to the patient and advised the patient to take other medications as prescribed and the RNCM would reach back out to the patient once the [facility] was able to collaborate with the pcp. The patient verbalized understanding. The patient is concerned that he will not get what he needs. Emotional support provided.  Reviewed medications with patient and discussed importance of medication adherence. 08-08-2020: The patient is compliant with medications regimen. 10-17-2020: The patient states that he has his Jardiance now and he is compliant with all of his medications. 12-15-2020: Compliant with all  medications except he is out of Lantus. Waiting for pcp determination for changes needed in medications regimen.  Discussed plans with patient for ongoing care management follow up and provided patient with direct contact information for care management team Provided patient with written educational materials related to hypo and hyperglycemia and importance of correct treatment. Reviewed with the patient the sx and sx and what to look for in changes. 12-15-2020: The patient states he is not having any low blood sugars. Review of sx and sx to look for. Will continue to monitor.  Reviewed scheduled/upcoming provider appointments including: pcp wants the patient to come back around 03-15-2021. Message sent to front office staff to call and make an appointment for annual physical for the patient. He will call for any other concerns. Advised patient, providing education and rationale, to check cbg BID and record, calling pcp for findings outside established parameters.  08-08-2020: The patient states he is doing well. His blood sugar this am was 129. He will have new blood work in June. Expects A1C to be trending down. 12-15-2020: Consistently checking blood sugars and states average is around 120.   Referral made to pharmacy team for assistance with medication education, management, and help with the importance of taking blood sugars daily and managing blood sugars with medications and dietary constraints. 12-15-2020: The patient is working with pharm D for medications management.  Review of patient status, including review of consultants reports, relevant laboratory and other test results, and medications completed. Self-Care Activities - UNABLE to independently manage DM as evidence of hemoglobin A1C of 14, 10-17-2020: Newest hemoglobin A1C of 6.6 on 09-13-2020- praised for accomplishments  Self administers oral medications as prescribed Attends all scheduled provider appointments Checks blood sugars as  prescribed and utilize hyper and hypoglycemia protocol as needed Adheres to prescribed ADA/carb modified Patient Goals: - check blood sugar at prescribed times - check blood sugar before and after exercise - check blood sugar if I feel it is too high or too low - enter blood sugar readings and medication or insulin into daily log - take the blood sugar log to all doctor visits - take the blood sugar meter to all doctor visits - change to whole grain breads, cereal, pasta - drink 6 to 8 glasses of water each day - fill half of plate with vegetables - limit fast food meals to no more than 1 per week - manage portion size - prepare main meal at home 3 to 5 days each week - read food labels for fat, fiber, carbohydrates and portion size - switch to low-fat or skim milk - switch to sugar-free drinks - schedule appointment with eye doctor - check feet daily for cuts, sores or redness - do heel pump exercise 2 to 3 times  each day - keep feet up while sitting - trim toenails straight across - wash and dry feet carefully every day - wear comfortable, cotton socks - wear comfortable, well-fitting shoes barriers to adherence to treatment plan identified - blood glucose monitoring encouraged - blood glucose readings reviewed - counseling by pharmacist provided- pharm D-co collaboration. Pharm D will contact the patient for help with effective management of DM and other chronic conditions  - individualized medical nutrition therapy provided - mutual A1C goal set or reviewed - resources required to improve adherence to care identified - self-awareness of signs/symptoms of hypo or hyperglycemia encouraged - use of blood glucose monitoring log promoted- to send log by mail to the patient.    Follow Up Plan: Telephone follow up appointment with care management team member scheduled for: 01-12-2021 at 230 pm and will follow up after collaboration with pcp and pharm D.     Care Plan : RNCM:  Hypertension (Adult)  Updates made by Vanita Ingles, RN since 12/15/2020 12:00 AM     Problem: RNCM: Hypertension (Hypertension)   Priority: Medium     Long-Range Goal: RNCM: Hypertension Monitored   Start Date: 06/06/2020  Expected End Date: 08/13/2021  This Visit's Progress: On track  Recent Progress: On track  Priority: Medium  Note:   Objective:  Last practice recorded BP readings:  BP Readings from Last 3 Encounters:  10/17/20 (!) 120/40  09/13/20 (!) 121/49  07/12/20 129/60   Most recent eGFR/CrCl: No results found for: EGFR  No components found for: CRCL Current Barriers:  Knowledge Deficits related to basic understanding of hypertension pathophysiology and self care management Knowledge Deficits related to understanding of medications prescribed for management of hypertension Difficulty obtaining medications Non-adherence to prescribed medication regimen Limited Social Designer, multimedia.  Unable to independently manage HTN Does not adhere to prescribed medication regimen Lacks social connections Does not contact provider office for questions/concerns Case Manager Clinical Goal(s):  patient will verbalize understanding of plan for hypertension management patient will attend all scheduled medical appointments: office to call and secure an appointment for 02-2021 patient will demonstrate improved adherence to prescribed treatment plan for hypertension as evidenced by taking all medications as prescribed, monitoring and recording blood pressure as directed, adhering to low sodium/DASH diet patient will demonstrate improved health management independence as evidenced by checking blood pressure as directed and notifying PCP if SBP>160 or DBP > 90, taking all medications as prescribe, and adhering to a low sodium diet as discussed. patient will verbalize basic understanding of hypertension disease process and self health management plan as evidenced by compliance  with  heart healthy/ADA diet, compliance with medications and working with the CCM team to optimize health and well being.  Interventions:  Collaboration with Anthony Fenton, NP regarding development and update of comprehensive plan of care as evidenced by provider attestation and co-signature Inter-disciplinary care team collaboration (see longitudinal plan of care) UNABLE to independently:manage HTN effectively  Evaluation of current treatment plan related to hypertension self management and patient's adherence to plan as established by provider. 12-15-2020: Reviewed with the patient if the patient is taking amlodipine now and the patient confirms compliance. Education on taking medications and if he runs out to let the [facility] or pharm D know. The patient verbalized understanding.  Provided education to patient re: stroke prevention, s/s of heart attack and stroke, DASH diet, complications of uncontrolled blood pressure Reviewed medications with patient and discussed importance of compliance Discussed plans with patient for  ongoing care management follow up and provided patient with direct contact information for care management team Advised patient, providing education and rationale, to monitor blood pressure daily and record, calling PCP for findings outside established parameters.  Self-Care Activities: - Self administers medications as prescribed Attends all scheduled provider appointments Calls provider office for new concerns, questions, or BP outside discussed parameters Checks BP and records as discussed Follows a low sodium diet/DASH diet Patient Goals: - check blood pressure 3 times per week - choose a place to take my blood pressure (home, clinic or office, retail store) - write blood pressure results in a log or diary - agree on reward when goals are met - agree to work together to make changes - ask questions to understand - have a family meeting to talk about healthy habits -  learn about high blood pressure  Follow Up Plan: Telephone follow up appointment with care management team member scheduled for: 01-12-2021 at 230 pm    Care Plan : RNCM: HLD Management  Updates made by Vanita Ingles, RN since 12/15/2020 12:00 AM     Problem: RNCM: Management of HLD   Priority: Medium     Long-Range Goal: RNCM: Management of HLD   Start Date: 06/06/2020  Expected End Date: 08/13/2021  This Visit's Progress: On track  Recent Progress: On track  Priority: Medium  Note:   Current Barriers:  Poorly controlled hyperlipidemia, complicated by uncontrolled DM Current antihyperlipidemic regimen: Lipitor 20 Mg QD Most recent lipid panel:  Lab Results  Component Value Date   CHOL 125 09/20/2020   CHOL 235 (H) 03/22/2020   Lab Results  Component Value Date   HDL 34 (L) 09/20/2020   HDL 36 (L) 03/22/2020   Lab Results  Component Value Date   LDLCALC 70 09/20/2020   LDLCALC 158 (H) 03/22/2020   Lab Results  Component Value Date   TRIG 119 09/20/2020   TRIG 242 (H) 03/22/2020   Lab Results  Component Value Date   CHOLHDL 3.7 09/20/2020   CHOLHDL 6.5 (H) 03/22/2020   No results found for: LDLDIRECT  ASCVD risk enhancing conditions: age 32,  DM, HTN, current smoker Unable to independently manage chronic conditons  Lacks social connections Does not maintain contact with provider office Does not contact provider office for questions/concerns RN Care Manager Clinical Goal(s):  patient will work with Consulting civil engineer, providers, and care team towards execution of optimized self-health management plan patient will verbalize understanding of plan for effective management of HLD  patient will work with [facility], CCM team  to address needs related to HLD  patient will attend all scheduled medical appointments: no upcoming appointments, knows to call for needs. Interventions: Collaboration with Anthony Fenton, NP regarding development and update of comprehensive plan of  care as evidenced by provider attestation and co-signature Inter-disciplinary care team collaboration (see longitudinal plan of care) Medication review performed; medication list updated in electronic medical record.  Inter-disciplinary care team collaboration (see longitudinal plan of care) Referred to pharmacy team for assistance with HLD medication management Evaluation of current treatment plan related to HLD  and patient's adherence to plan as established by provider. 12-15-2020: The patient is doing well and denies any issues related to HLD. States he is feeling great and eating good. He is being mindful of his health and is so thankful for the CCM team checking on him.  Advised patient to call the office for changes or questions Provided education to patient re: heart  healthy/ADA diet. 12-15-2020: States compliance with heart healthy/ADA diet. Denies any new concerns at this time.  Reviewed medications with patient and discussed compliance/cost constraints/questions. 12-15-2020: States that he is taking medications as prescribed  Provided patient with dietary restriction  educational materials related to Heart healthy/ADA diet and the benefits of controlling chronic conditions to promote health and well being.  Reviewed scheduled/upcoming provider appointments including: office to call with appointment for 02-2021 Discussed plans with patient for ongoing care management follow up and provided patient with direct contact information for care management team Patient Goals/Self-Care Activities: - call for medicine refill 2 or 3 days before it runs out - call if I am sick and can't take my medicine - keep a list of all the medicines I take; vitamins and herbals too - learn to read medicine labels - use a pillbox to sort medicine - use an alarm clock or phone to remind me to take my medicine - change to whole grain breads, cereal, pasta - drink 6 to 8 glasses of water each day - eat 3 to 5  servings of fruits and vegetables each day - eat 5 or 6 small meals each day - fill half the plate with nonstarchy vegetables - limit fast food meals to no more than 1 per week - manage portion size - prepare main meal at home 3 to 5 days each week - read food labels for fat, fiber, carbohydrates and portion size - switch to low-fat or skim milk - switch to sugar-free drinks - be open to making changes - I can manage, know and watch for signs of a heart attack - if I have chest pain, call for help - learn about small changes that will make a big difference - learn my personal risk factors  Follow Up Plan: Telephone follow up appointment with care management team member scheduled for: 01-12-2021 at 230 pm       Plan: Telephone follow up appointment with care management team member scheduled for:  01-12-2021 at 230 pm and will follow up with the patient after collaboration with the pcp concerning DM medications.  Noreene Larsson RN, MSN, [place] Mobile: [phone]

## 2020-12-16 ENCOUNTER — Other Ambulatory Visit: Payer: Self-pay | Admitting: Internal Medicine

## 2020-12-16 ENCOUNTER — Ambulatory Visit: Payer: Self-pay

## 2020-12-16 DIAGNOSIS — E119 Type 2 diabetes mellitus without complications: Secondary | ICD-10-CM

## 2020-12-16 MED ORDER — INSULIN DEGLUDEC 100 UNIT/ML ~~LOC~~ SOPN: 20.0000 [IU] | PEN_INJECTOR | Freq: Every day | SUBCUTANEOUS | 1 refills | Status: DC

## 2020-12-16 NOTE — Patient Instructions (Signed)
Visit Information   Goals Addressed             This Visit's Progress    RNCM: Monitor and Manage My Blood Sugar-Diabetes Type 2       Timeframe:  Long-Range Goal Priority:  High Start Date:       06-06-2020                      Expected End Date:      11-22-2021                Follow Up Date 01-12-2021   - check blood sugar at prescribed times - check blood sugar before and after exercise - check blood sugar if I feel it is too high or too low - enter blood sugar readings and medication or insulin into daily log - take the blood sugar log to all doctor visits - take the blood sugar meter to all doctor visits    Why is this important?   Checking your blood sugar at home helps to keep it from getting very high or very low.  Writing the results in a diary or log helps the doctor know how to care for you.  Your blood sugar log should have the time, date and the results.  Also, write down the amount of insulin or other medicine that you take.  Other information, like what you ate, exercise done and how you were feeling, will also be helpful.     Notes: Encouraged the patient to start taking blood sugars regularly. 08-08-2020: The patient is taking more regularly now. 10-17-2020: The patient states his blood sugars are staying around 120's. Last hemoglobin A1C was 6.6 on 09-13-2020. Praised the patient for checking and recording blood sugars. 12-15-2020: The patient states his blood sugars are doing well. The patient does state he has a problem however. He went to pick up his Lantus and told them what the pharm D told him to tell the pharmacy and was told he would have to pay out of pocket. The patient states he could not do that and he did not get the Lantus. The patient has not taken the Lantus x 2 days. RNCM [facility] and spoke to the pharmacist. The patients insurance does not perfer Lantus now and that is why he was told what he was. The insurance prefers [name] now.  Secure chat sent to the pcp, CMA, pharm D on call for direction. Will continue to monitor. 12-16-2020: Call made back to the patient after collaboration with the pcp. The pcp instructed that it was fine for the patient to take [name] now instead of Lantus. PCP [name] [place] instructed the patient to go and pick up [place] and take as directed. The patient verbalized understanding. Will collaborate with the pharm D for follow up either by pharm D or RNCM to see if patient has picked up medications and if there are any other RX needs.         The patient verbalized understanding of instructions, educational materials, and care plan provided today and declined offer to receive copy of patient instructions, educational materials, and care plan.   Telephone follow up appointment with care management team member scheduled for: 01-20-2021 at 80 pm  Noreene Larsson RN, MSN, [place] Mobile: [phone]

## 2020-12-16 NOTE — Chronic Care Management (AMB) (Signed)
Care Management    RN Visit Note  12/16/2020 Name: Anthony Bell MRN: [id number] DOB: Dec 01, 1955  Subjective: Anthony Bell is a 65 y.o. year old male who is a primary care patient of Anthony Fenton, NP. The care management team was consulted for assistance with disease management and care coordination needs.    Engaged with patient by telephone for follow up visit in response to provider referral for case management and/or care coordination services.   Consent to Services:   Anthony Bell was given information about Care Management services today including:  Care Management services includes personalized support from designated clinical staff supervised by his physician, including individualized plan of care and coordination with other care providers 24/7 contact phone numbers for assistance for urgent and routine care needs. The patient may stop case management services at any time by phone call to the office staff.  Patient agreed to services and consent obtained.   Assessment: Review of patient past medical history, allergies, medications, health status, including review of consultants reports, laboratory and other test data, was performed as part of comprehensive evaluation and provision of chronic care management services.   SDOH (Social Determinants of Health) assessments and interventions performed:    Care Plan  No Known Allergies  Outpatient Encounter Medications as of 12/16/2020  Medication Sig   amLODipine (NORVASC) 5 MG tablet Take 1 tablet (5 mg total) by mouth daily.   aspirin EC 81 MG tablet Take 81 mg by mouth.    atorvastatin (LIPITOR) 20 MG tablet Take 1 tablet (20 mg total) by mouth daily.   blood glucose meter kit and supplies KIT Dispense based on patient and insurance preference. Use up to four times daily as directed. 100 strips and lancets please   Blood Glucose Monitoring Suppl (CONTOUR NEXT EZ) w/Device KIT USE UP TO FOUR TIMES DAILY AS DIRECTED. (FOR ICD-10  E10.9, E11.9).   CONTOUR NEXT TEST test strip USE AS DIRECTED UPTO 4 TIMES A DAY   empagliflozin (JARDIANCE) 10 MG TABS tablet Take 1 tablet (10 mg total) by mouth daily before breakfast.   hydrochlorothiazide (HYDRODIURIL) 25 MG tablet Take 1 tablet (25 mg total) by mouth daily.   insulin degludec (TRESIBA) 100 UNIT/ML FlexTouch Pen Inject 20 Units into the skin daily.   Insulin Pen Needle 32G X 4 MM MISC 1 Units by Does not apply route every morning. Pen needles   lisinopril (ZESTRIL) 20 MG tablet Take 1 tablet (20 mg total) by mouth daily.   metFORMIN (GLUCOPHAGE) 1000 MG tablet Take 1 tablet (1,000 mg total) by mouth 2 (two) times daily with a meal.   tadalafil (CIALIS) 5 MG tablet Take 1 tablet (5 mg total) by mouth daily as needed for erectile dysfunction.   No facility-administered encounter medications on file as of 12/16/2020.    Patient Active Problem List   Diagnosis Date Noted   Overweight (BMI 25.0-29.9) 09/13/2020   COPD (chronic obstructive pulmonary disease) ([place]) 06/14/2020   Hyperlipidemia 03/23/2020   Erectile dysfunction 03/22/2020   Type 2 diabetes mellitus ([place]) 08/23/2017   Gastroesophageal reflux disease 12/07/2014   Essential hypertension 12/07/2014   History of rectal cancer 12/07/2014    Conditions to be addressed/monitored: DMII  Care Plan : RNCM: Diabetes Type 2 (Adult)  Updates made by Anthony Ingles, RN since 12/16/2020 12:00 AM     Problem: RNCM: Glycemic Management (Diabetes, Type 2)   Priority: Medium     Long-Range Goal: RNCM: Glycemic Management Optimized  Start Date: 06/06/2020  Expected End Date: 08/13/2021  This Visit's Progress: On track  Recent Progress: On track  Priority: High  Note:   Objective:  Lab Results  Component Value Date   HGBA1C 6.6 (A) 09/13/2020    Lab Results  Component Value Date   CREATININE 1.21 06/02/2020   CREATININE 1.47 (H) 03/22/2020   CREATININE 1.55 (H) 02/02/2018   No results found for: EGFR Current  Barriers:  Knowledge Deficits related to basic Diabetes pathophysiology and self care/management Knowledge Deficits related to medications used for management of diabetes Does not have glucometer to monitor blood sugar- meter ordered on 06-02-2020. 10-17-2020: The patient is consistently checking blood sugars at this time.  Limited Social Support Unable to independently manage DM Lacks social connections Does not maintain contact with provider office Does not contact provider office for questions/concerns Out of Lantus, went to pharmacy to get and they told him he would have to pay out of pocket- Has been out of Lantus x 2 days (12-15-2020) Case Manager Clinical Goal(s):  patient will demonstrate improved adherence to prescribed treatment plan for diabetes self care/management as evidenced by: daily monitoring and recording of CBG  adherence to ADA/ carb modified diet adherence to prescribed medication regimen contacting provider for new or worsened symptoms or questions Interventions:  Collaboration with Anthony Fenton, NP regarding development and update of comprehensive plan of care as evidenced by provider attestation and co-signature Inter-disciplinary care team collaboration (see longitudinal plan of care) Provided education to patient about basic DM disease process. Education on checking blood sugars. The patient states his sister is supposed to bring him a meter. Co-collaboration with the pharmacist. Meter was ordered on 06-02-2020 by pcp. Pharm D will assist with meter use and medications management. Spoke with the patient about the importance of checking blood sugars and fasting blood sugar <130 and post prandial <180.  Also discussed goal of hemoglobin A1C of 7.0 or less. The patient verbalized understanding. The patient drinks Pepsi but has switched to water. Talked about no sugar options for drinks. The patient does not like diet pepsi. Offered other options for the patient to try. Expressed  the benefits of controlled DM on organ systems. The patient is receptive to learning. 10-14-2020: The patient states that he is doing much better with managing his DM. He is not drinking Pepsi any more except when he needs a little taste and he will open one and get a taste and be done with it. He is mostly always drinking water.  He waits until he gets to work to eat breakfast. Denies any lows at this time. Says that his blood sugars are staying consistently at 120. Praised for newest hemoglobin A1C of 6.6 on 6.21-2022. 12-15-2020: The patient states that he is doing well but has a problem and was not able to get his insulin (Lantus), The patient went to the pharmacy and advised the to split pay the Lantus and they told him he would have to pay out of pocket. The patient states that he could not do this and did not get the Lantus.  The patient wanted to know if he could get samples or what he needed to do. The RNCM contacted the office by secure chat. Pcp has left for the day but CMA advised that the office did not have any samples of Lantus. The RNCM called [facility] and ask about the issue with the Lantus and was told that his insurance does not prefer Lantus now but  [name]. Information provided to the office staff. Will wait to hear back from the pcp for the next steps for the patient. Call made back to the patient and advised the patient to take other medications as prescribed and the RNCM would reach back out to the patient once the [facility] was able to collaborate with the pcp. The patient verbalized understanding. The patient is concerned that he will not get what he needs. Emotional support provided. 12-16-2020: Correspondence from pcp that it was okay for the patient to take [place] instead of Lantus. [facility] [place] called the patient back and told the patient about the change in medications and instructed the [facility] to [facility] and [facility] [place] and take as directed. The  patient was at work and states he would go by and pick up medication. Will collaborate with the pharm D for follow up with the patient early next week to make sure the patient has new RX and there are no issues with the RX.  The patient expressed thanks for helping him get the medications he needs. Will continue to monitor and assist as needed to help patient maintain his DM health and well being.  Reviewed medications with patient and discussed importance of medication adherence. 08-08-2020: The patient is compliant with medications regimen. 10-17-2020: The patient states that he has his Jardiance now and he is compliant with all of his medications. 12-15-2020: Compliant with all medications except he is out of Lantus. Waiting for pcp determination for changes needed in medications regimen. 12-16-2020: Instructed the patient to discontinue use of Lantus (patient had been out x 2 days) and start taking the [place] as directed. The [facility] is [facility] [place] to pick new medication up. Instructed the patient to call the office for any changes or issues related to getting [place]. Discussed plans with patient for ongoing care management follow up and provided patient with direct contact information for care management team Provided patient with written educational materials related to hypo and hyperglycemia and importance of correct treatment. Reviewed with the patient the sx and sx and what to look for in changes. 12-15-2020: The patient states he is not having any low blood sugars. Review of sx and sx to look for. Will continue to monitor.  Reviewed scheduled/upcoming provider appointments including: pcp wants the patient to come back around 03-15-2021. Message sent to front office staff to call and make an appointment for annual physical for the patient. He will call for any other concerns. Advised patient, providing education and rationale, to check cbg BID and record, calling pcp for findings  outside established parameters.  08-08-2020: The patient states he is doing well. His blood sugar this am was 129. He will have new blood work in June. Expects A1C to be trending down. 12-15-2020: Consistently checking blood sugars and states average is around 120.   Referral made to pharmacy team for assistance with medication education, management, and help with the importance of taking blood sugars daily and managing blood sugars with medications and dietary constraints. 12-15-2020: The patient is working with pharm D for medications management.  Review of patient status, including review of consultants reports, relevant laboratory and other test results, and medications completed. Self-Care Activities - UNABLE to independently manage DM as evidence of hemoglobin A1C of 14, 10-17-2020: Newest hemoglobin A1C of 6.6 on 09-13-2020- praised for accomplishments  Self administers oral medications as prescribed Attends all scheduled  provider appointments Checks blood sugars as prescribed and utilize hyper and hypoglycemia protocol as needed Adheres to prescribed ADA/carb modified Patient Goals: - check blood sugar at prescribed times - check blood sugar before and after exercise - check blood sugar if I feel it is too high or too low - enter blood sugar readings and medication or insulin into daily log - take the blood sugar log to all doctor visits - take the blood sugar meter to all doctor visits - change to whole grain breads, cereal, pasta - drink 6 to 8 glasses of water each day - fill half of plate with vegetables - limit fast food meals to no more than 1 per week - manage portion size - prepare main meal at home 3 to 5 days each week - read food labels for fat, fiber, carbohydrates and portion size - switch to low-fat or skim milk - switch to sugar-free drinks - schedule appointment with eye doctor - check feet daily for cuts, sores or redness - do heel pump exercise 2 to 3 times each day -  keep feet up while sitting - trim toenails straight across - wash and dry feet carefully every day - wear comfortable, cotton socks - wear comfortable, well-fitting shoes barriers to adherence to treatment plan identified - blood glucose monitoring encouraged - blood glucose readings reviewed - counseling by pharmacist provided- pharm D-co collaboration. Pharm D will contact the patient for help with effective management of DM and other chronic conditions  - individualized medical nutrition therapy provided - mutual A1C goal set or reviewed - resources required to improve adherence to care identified - self-awareness of signs/symptoms of hypo or hyperglycemia encouraged - use of blood glucose monitoring log promoted- to send log by mail to the patient.    Follow Up Plan: Telephone follow up appointment with care management team member scheduled for: 01-12-2021 at 230 pm and will follow up after collaboration with pcp and pharm D, follow up made on 12-16-2020. Will check with pharm D for further follow ups or needs before next outreach.      Plan: Telephone follow up appointment with care management team member scheduled for:  01-12-2021 at 64 pm  Noreene Larsson RN, MSN, [place]: [phone]

## 2020-12-19 ENCOUNTER — Ambulatory Visit: Payer: BC Managed Care – PPO | Admitting: Pharmacist

## 2020-12-19 DIAGNOSIS — E1165 Type 2 diabetes mellitus with hyperglycemia: Secondary | ICD-10-CM

## 2020-12-19 NOTE — Patient Instructions (Signed)
Thank you allowing the Care Management Team to be a part of your care! It was a pleasure speaking with you today!     Care Management Team    Noreene Larsson RN, MSN, CCM Nurse Care Coordinator  [phone]   Wallace Cullens, PharmD  Clinical Pharmacist  [phone]   Christa See, MSW, LCSW Clinical Social Worker [phone]   Visit Information   Goals Addressed             This Visit's Progress    Pharmacy Goals       Our goal A1c is less than 7%. This corresponds with fasting sugars less than 130 and 2 hour after meal sugars less than 180. Please check your fasting blood sugar and keep a log of the results  Our goal bad cholesterol, or LDL, is less than 70 . This is why it is important to continue taking your atorvastatin  Our goal blood pressure is less than 130/80. Please check your blood pressure and keep log of the results.  Feel free to call me with any questions or concerns. I look forward to our next call!  Wallace Cullens, PharmD, [place] [phone]       The patient verbalized understanding of instructions, educational materials, and care plan provided today and declined offer to receive copy of patient instructions, educational materials, and care plan.  Telephone follow up appointment with care management team member scheduled for: 10/5 at 4 pm

## 2020-12-19 NOTE — Chronic Care Management (AMB) (Signed)
Care Management   Pharmacy Note  12/19/2020 Name: Anthony Bell MRN: [id number] DOB: 1955-08-04  Subjective: Anthony Bell is a 65 y.o. year old male who is a primary care patient of [name], NP. The Care Management team was consulted for assistance with care management and care coordination needs.    Engaged with patient by telephone for follow up visit in response to provider referral for pharmacy case management and/or care coordination services.   The patient was given information about Care Management services today including:  Care Management services includes personalized support from designated clinical staff supervised by the patient's primary care provider, including individualized plan of care and coordination with other care providers. 24/7 contact phone numbers for assistance for urgent and routine care needs. The patient may stop case management services at any time by phone call to the office staff.  Patient agreed to services and consent obtained.  Assessment:  Review of patient status, including review of consultants reports, laboratory and other test data, was performed as part of comprehensive evaluation and provision of chronic care management services.   SDOH (Social Determinants of Health) assessments and interventions performed:  none  Objective:  Lab Results  Component Value Date   CREATININE 1.26 (H) 09/20/2020   CREATININE 1.21 06/02/2020   CREATININE 1.47 (H) 03/22/2020    Lab Results  Component Value Date   HGBA1C 6.6 (A) 09/13/2020    BP Readings from Last 3 Encounters:  10/17/20 (!) 120/40  09/13/20 (!) 121/49  07/12/20 129/60    Care Plan  No Known Allergies  Medications Reviewed    Reviewed by Vanita Ingles (Case Manager) on 10/17/20 at 1600  Med List Status: <None>   Medication Order Taking? Sig Documenting Provider Last Dose Status Informant  amLODipine (NORVASC) 5 MG tablet [id number]  Take 1 tablet (5 mg total) by mouth  daily. [name], NP  Active   aspirin EC 81 MG tablet [id number]  Take 81 mg by mouth.  [provider]  Active Self  atorvastatin (LIPITOR) 20 MG tablet [id number]  Take 1 tablet (20 mg total) by mouth daily. [name], FNP  Active   blood glucose meter kit and supplies KIT [id number]  Dispense based on patient and insurance preference. Use up to four times daily as directed. 100 strips and lancets please [name], NP  Active   Blood Glucose Monitoring Suppl (CONTOUR NEXT EZ) w/Device KIT [id number]  USE UP TO FOUR TIMES DAILY AS DIRECTED. (FOR ICD-10 E10.9, E11.9). [name], DO  Active   CONTOUR NEXT TEST test strip [id number]  USE AS DIRECTED UPTO 4 TIMES A [name], NP  Active   empagliflozin (JARDIANCE) 10 MG TABS tablet [id number]  Take 1 tablet (10 mg total) by mouth daily before breakfast.  Patient not taking: Reported on 10/12/2020   [name], NP  Active   hydrochlorothiazide (HYDRODIURIL) 25 MG tablet [id number]  Take 1 tablet (25 mg total) by mouth daily. Malfi, Lupita Raider, FNP  Active   insulin glargine (LANTUS SOLOSTAR) 100 UNIT/ML Solostar Pen [id number]  Inject 20 Units into the skin daily. [name], NP  Active   Insulin Pen Needle 32G X 4 MM MISC [id number]  1 Units by Does not apply route every morning. Pen needles [name], NP  Active   lisinopril (ZESTRIL) 20 MG tablet [id number]  Take 1 tablet (20 mg total) by mouth daily. Malfi, Lupita Raider, FNP  Active   metFORMIN (  GLUCOPHAGE) 1000 MG tablet [id number]  Take 1 tablet (1,000 mg total) by mouth 2 (two) times daily with a meal. [name], NP  Active   tadalafil (CIALIS) 5 MG tablet [id number]  Take 1 tablet (5 mg total) by mouth daily as needed for erectile dysfunction. [name], FNP  Active             Patient Active Problem List   Diagnosis Date Noted   Overweight (BMI 25.0-29.9) 09/13/2020   COPD (chronic obstructive pulmonary disease) ([place]) 06/14/2020    Hyperlipidemia 03/23/2020   Erectile dysfunction 03/22/2020   Type 2 diabetes mellitus ([place]) 08/23/2017   Gastroesophageal reflux disease 12/07/2014   Essential hypertension 12/07/2014   History of rectal cancer 12/07/2014    Conditions to be addressed/monitored: HTN, HLD, and DMII  Care Plan : PharmD Medication Management  Updates made by [name], RPH-CPP since 12/19/2020 12:00 AM     Problem: Disease Progression      Long-Range Goal: Disease Progression Prevented or Minimized   Start Date: 06/06/2020  Expected End Date: 09/04/2020  Recent Progress: On track  Priority: High  Note:   Current Barriers:  Financial barriers Using copayment card from manufacturers for [place] Lack of blood pressure results for clinical team  Pharmacist Clinical Goal(s):  Over the next 90 days, patient will achieve control of blood sugar as evidenced by A1C <7%  through collaboration with PharmD and provider.   Interventions: 1:1 collaboration with Webb Silversmith NP regarding development and update of comprehensive plan of care as evidenced by provider attestation and co-signature Inter-disciplinary care team collaboration (see longitudinal plan of care) Receive message from [facility] Nurse Case Manager advising patient ran out of Lantus last week Reported [facility] advised patient's insurance no longer covered Lantus, instead required to switch to [place].  CM RNCM had contacted PCP and provider had sent new Rx for Tresiba 20 units daily to pharmacy on 9/23 Requests follow up to patient as CM Pharmacist back in office today  T2DM: Current treatment: Metformin 1000 mg twice daily with meals Jardiance 10 mg daily before breakfast Tresiba 20 units daily (planning to start [place] today) Previously on Lantus 20 units daily Outreach and speak briefly to patient (patient at work). Reports he has not yet picked up [place]. Requests help from [facility] Pharmacist with medication cost.   Patient agrees to sharing his information with [employer] via their website to sign up for copay savings card. Collaborate with Wendy/Angela at [place] to resolve copay card billing issue [facility] confirms patient will receive 1 box (5 pens) for $25 copayment Follow up with patient in afternoon. Patient confirms has picked up [place] Rx and will start taking as directed by PCP Counsel patient that with this switch from one insulin product to another, important to closely monitor his blood sugar, keep log of the results and contact office for readings outside of established parameters, particularly for any s/s of hypoglycemia Review counseling on s/s of hypoglycemia Patient confirms carries soft peppermint candies with him to use as rapid source of sugar if needed for lows Reports prior to running out of Lantus, morning fasting CBGs ranging 99-104 Patient requests assistance following up with [facility] regarding issue with billing of Rx for pen needles.   Patient Goals/Self-Care Activities Over the next 90 days, patient will:  - check glucose, document, and provide at future appointments - check blood pressure, document, and provide at  future appointments - attend medical appointments as scheduled  Follow Up Plan: Telephone follow up appointment with care management team member scheduled for: 10/5 at 4 pm       Follow Up:  Patient agrees to Care Plan and Follow-up.  Wallace Cullens, PharmD, [place], CPP Clinical Pharmacist [facility] [phone]

## 2020-12-28 ENCOUNTER — Ambulatory Visit: Payer: BC Managed Care – PPO | Admitting: Pharmacist

## 2020-12-28 DIAGNOSIS — E1165 Type 2 diabetes mellitus with hyperglycemia: Secondary | ICD-10-CM

## 2020-12-28 DIAGNOSIS — I10 Essential (primary) hypertension: Secondary | ICD-10-CM

## 2020-12-28 DIAGNOSIS — E782 Mixed hyperlipidemia: Secondary | ICD-10-CM

## 2020-12-29 NOTE — Chronic Care Management (AMB) (Signed)
Care Management   Pharmacy Note  12/28/2020 Name: Morley Gaumer MRN: [id number] DOB: Aug 05, 1955  Subjective: Christo Hain is a 65 y.o. year old male who is a primary care patient of [name], NP. The Care Management team was consulted for assistance with care management and care coordination needs.    Engaged with patient by telephone for follow up visit in response to provider referral for pharmacy case management and/or care coordination services.   The patient was given information about Care Management services today including:  Care Management services includes personalized support from designated clinical staff supervised by the patient's primary care provider, including individualized plan of care and coordination with other care providers. 24/7 contact phone numbers for assistance for urgent and routine care needs. The patient may stop case management services at any time by phone call to the office staff.  Patient agreed to services and consent obtained.  Assessment:  Review of patient status, including review of consultants reports, laboratory and other test data, was performed as part of comprehensive evaluation and provision of chronic care management services.   SDOH (Social Determinants of Health) assessments and interventions performed:  yes  Objective:  Lab Results  Component Value Date   CREATININE 1.26 (H) 09/20/2020   CREATININE 1.21 06/02/2020   CREATININE 1.47 (H) 03/22/2020    Lab Results  Component Value Date   HGBA1C 6.6 (A) 09/13/2020       Component Value Date/Time   CHOL 125 09/20/2020 0750   TRIG 119 09/20/2020 0750   HDL 34 (L) 09/20/2020 0750   CHOLHDL 3.7 09/20/2020 0750   LDLCALC 70 09/20/2020 0750    BP Readings from Last 3 Encounters:  10/17/20 (!) 120/40  09/13/20 (!) 121/49  07/12/20 129/60    Care Plan  No Known Allergies  Medications Reviewed Today     Reviewed by Vanita Ingles (Case Manager) on 10/17/20 at 1600   Med List Status: <None>   Medication Order Taking? Sig Documenting Provider Last Dose Status Informant  amLODipine (NORVASC) 5 MG tablet [id number]  Take 1 tablet (5 mg total) by mouth daily. [name], NP  Active   aspirin EC 81 MG tablet [id number]  Take 81 mg by mouth.  [provider]  Active Self  atorvastatin (LIPITOR) 20 MG tablet [id number]  Take 1 tablet (20 mg total) by mouth daily. [name], FNP  Active   blood glucose meter kit and supplies KIT [id number]  Dispense based on patient and insurance preference. Use up to four times daily as directed. 100 strips and lancets please [name], NP  Active   Blood Glucose Monitoring Suppl (CONTOUR NEXT EZ) w/Device KIT [id number]  USE UP TO FOUR TIMES DAILY AS DIRECTED. (FOR ICD-10 E10.9, E11.9). [name], DO  Active   CONTOUR NEXT TEST test strip [id number]  USE AS DIRECTED UPTO 4 TIMES A [name], NP  Active   empagliflozin (JARDIANCE) 10 MG TABS tablet [id number]  Take 1 tablet (10 mg total) by mouth daily before breakfast.  Patient not taking: Reported on 10/12/2020   [name], NP  Active   hydrochlorothiazide (HYDRODIURIL) 25 MG tablet [id number]  Take 1 tablet (25 mg total) by mouth daily. Malfi, Lupita Raider, FNP  Active   insulin glargine (LANTUS SOLOSTAR) 100 UNIT/ML Solostar Pen [id number]  Inject 20 Units into the skin daily. [name], NP  Active   Insulin Pen Needle 32G X 4 MM MISC [id number]  1  Units by Does not apply route every morning. Pen needles [name], NP  Active   lisinopril (ZESTRIL) 20 MG tablet [id number]  Take 1 tablet (20 mg total) by mouth daily. [name], FNP  Active   metFORMIN (GLUCOPHAGE) 1000 MG tablet [id number]  Take 1 tablet (1,000 mg total) by mouth 2 (two) times daily with a meal. [name], NP  Active   tadalafil (CIALIS) 5 MG tablet [id number]  Take 1 tablet (5 mg total) by mouth daily as needed for erectile dysfunction. [name], FNP   Active             Patient Active Problem List   Diagnosis Date Noted   Overweight (BMI 25.0-29.9) 09/13/2020   COPD (chronic obstructive pulmonary disease) ([place]) 06/14/2020   Hyperlipidemia 03/23/2020   Erectile dysfunction 03/22/2020   Type 2 diabetes mellitus ([place]) 08/23/2017   Gastroesophageal reflux disease 12/07/2014   Essential hypertension 12/07/2014   History of rectal cancer 12/07/2014    Conditions to be addressed/monitored: HTN, HLD, and DMII  Care Plan : PharmD Medication Management  Updates made by [name], RPH-CPP since 12/29/2020 12:00 AM     Problem: Disease Progression      Long-Range Goal: Disease Progression Prevented or Minimized   Start Date: 06/06/2020  Expected End Date: 09/04/2020  This Visit's Progress: On track  Recent Progress: On track  Priority: High  Note:   Current Barriers:  Financial barriers Using copayment card from manufacturers for [place] Lack of blood pressure results for clinical team  Pharmacist Clinical Goal(s):  Over the next 90 days, patient will achieve control of blood sugar as evidenced by A1C <7%  through collaboration with PharmD and provider.   Interventions: 1:1 collaboration with Webb Silversmith NP regarding development and update of comprehensive plan of care as evidenced by provider attestation and co-signature Inter-disciplinary care team collaboration (see longitudinal plan of care)  T2DM: Current treatment: Metformin 1000 mg twice daily with meals Jardiance 10 mg daily before breakfast Tresiba 20 units daily Reports recent morning fasting CBGs ranging 93-103 Consulted with PCP Webb Silversmith via Secure chat. Recommended providing instruction: Patient may reduce Tresiba dose by 2 units every 7 days for fasting blood sugars consistently <95 mg/dL. Provider agreed Provide this direction to patient today and he verbalizes understanding via teach back method Review counseling on s/s of  hypoglycemia Note patient carries soft peppermint candies with him to use as rapid source of sugar if needed for lows Patient requests assistance following up with [facility] regarding issue with billing of Rx for pen needles.  Reports only has 5 pen needles remaining Collaborate with [facility] today. Pharmacy will fill Rx for pen needles and notify patient when ready Patient reports will plan to go by pharmacy tomorrow to pick up  Hypertension: Current treatment: HCTZ 25 mg daily lisinopril 20 mg daily amlodipine 5 mg daily Denies checking BP recently Denies symptoms of hypotension or hypertension Encourage patient to continue to have BP checked, keep a log of the results (including heart rate) and have this record for [place] to review during our calls  Hyperlipidemia: Controlled; current treatment: Atorvastatin 20 mg daily   Medication Adherence: Note his wife helps to manage his medications including his Lantus nightly Patient using weekly pillboxes as adherence aid. Have advised patient to also use medication list from latest office visit when refilling pillbox to confirm has all medications.   Tobacco Abuse: Reports has reduces smoking to ~10-12  cigarettes/day Discussed benefits of smoking cessation Reports has been thinking about quitting, but would want to do this with his wife  Patient Goals/Self-Care Activities Over the next 90 days, patient will:  - check glucose, document, and provide at future appointments - check blood pressure, document, and provide at future appointments - attend medical appointments as scheduled  Follow Up Plan: Telephone follow up appointment with care management team member scheduled for: 01/25/2021 at 3:45 PM       Follow Up:  Patient agrees to Care Plan and Follow-up.  Wallace Cullens, PharmD, [place], CPP Clinical Pharmacist [facility] [phone]

## 2020-12-29 NOTE — Patient Instructions (Signed)
Thank you allowing the Care Management Team to be a part of your care! It was a pleasure speaking with you today!     Care Management Team    Noreene Larsson RN, MSN, CCM Nurse Care Coordinator  [phone]   Wallace Cullens, PharmD  Clinical Pharmacist  [phone]   Christa See, MSW, LCSW Clinical Social Worker [phone]   Visit Information   Goals Addressed             This Visit's Progress    Pharmacy Goals       Our goal A1c is less than 7%. This corresponds with fasting sugars less than 130 and 2 hour after meal sugars less than 180. Please check your fasting blood sugar and keep a log of the results  Our goal bad cholesterol, or LDL, is less than 70 . This is why it is important to continue taking your atorvastatin  Our goal blood pressure is less than 130/80. Please check your blood pressure and keep log of the results.  Feel free to call me with any questions or concerns. I look forward to our next call!    Wallace Cullens, PharmD, [place] [phone]        The patient verbalized understanding of instructions, educational materials, and care plan provided today and declined offer to receive copy of patient instructions, educational materials, and care plan.   Telephone follow up appointment with care management team member scheduled for: 01/25/2021 at 3:45 PM

## 2021-01-12 ENCOUNTER — Ambulatory Visit: Payer: Self-pay

## 2021-01-12 ENCOUNTER — Telehealth: Payer: BC Managed Care – PPO | Admitting: General Practice

## 2021-01-12 DIAGNOSIS — E1165 Type 2 diabetes mellitus with hyperglycemia: Secondary | ICD-10-CM

## 2021-01-12 DIAGNOSIS — Z720 Tobacco use: Secondary | ICD-10-CM

## 2021-01-12 DIAGNOSIS — I10 Essential (primary) hypertension: Secondary | ICD-10-CM

## 2021-01-12 DIAGNOSIS — E782 Mixed hyperlipidemia: Secondary | ICD-10-CM

## 2021-01-12 NOTE — Patient Instructions (Signed)
Visit Information   Goals Addressed             This Visit's Progress    RNCM: Monitor and Manage My Blood Sugar-Diabetes Type 2       Timeframe:  Long-Range Goal Priority:  High Start Date:       06-06-2020                      Expected End Date:      11-22-2021                Follow Up Date 02-23-2021   - check blood sugar at prescribed times - check blood sugar before and after exercise - check blood sugar if I feel it is too high or too low - enter blood sugar readings and medication or insulin into daily log - take the blood sugar log to all doctor visits - take the blood sugar meter to all doctor visits    Why is this important?   Checking your blood sugar at home helps to keep it from getting very high or very low.  Writing the results in a diary or log helps the doctor know how to care for you.  Your blood sugar log should have the time, date and the results.  Also, write down the amount of insulin or other medicine that you take.  Other information, like what you ate, exercise done and how you were feeling, will also be helpful.     Notes: Encouraged the patient to start taking blood sugars regularly. 08-08-2020: The patient is taking more regularly now. 10-17-2020: The patient states his blood sugars are staying around 120's. Last hemoglobin A1C was 6.6 on 09-13-2020. Praised the patient for checking and recording blood sugars. 12-15-2020: The patient states his blood sugars are doing well. The patient does state he has a problem however. He went to pick up his Lantus and told them what the pharm D told him to tell the pharmacy and was told he would have to pay out of pocket. The patient states he could not do that and he did not get the Lantus. The patient has not taken the Lantus x 2 days. [facility]CM [facility] and spoke to the [facility]ist. The patients insurance does not perfer Lantus now and that is why he was told what he was. The insurance prefers [name] now. Secure  chat sent to the pcp, CMA, pharm D on call for direction. Will continue to monitor. 12-16-2020: Call made back to the patient after collaboration with the pcp. The pcp instructed that it was fine for the patient to take [name] now instead of Lantus. [name] [place] instructed the patient to go and pick up [place] and take as directed. The patient verbalized understanding. Will collaborate with the pharm D for follow up either by pharm D or RNCM to see if patient has picked up medications and if there are any other RX needs. 01-12-2021: The patient states that he has his medications and is doing well with the [place]. He is still at 20 units daily and his blood sugars are 118-120. He states he has not had any below 95 and he is going to keep the [place] at the 20 units. The patient states he feel great and thanks the CCM team for all their help with his medications and health care needs.  The patient verbalized understanding of instructions, educational materials, and care plan provided today and declined offer to receive copy of patient instructions, educational materials, and care plan.   Telephone follow up appointment with care management team member scheduled for: 02-23-2021 at 345 pm  Noreene Larsson RN, MSN, [place] Mobile: [phone]

## 2021-01-12 NOTE — Chronic Care Management (AMB) (Signed)
Care Management    RN Visit Note  01/12/2021 Name: Anthony Bell MRN: [id number] DOB: 1955-06-28  Subjective: Anthony Bell is a 65 y.o. year old male who is a primary care patient of Anthony Fenton, NP. The care management team was consulted for assistance with disease management and care coordination needs.    Engaged with patient by telephone for follow up visit in response to provider referral for case management and/or care coordination services.   Consent to Services:   [name] was given information about Care Management services today including:  Care Management services includes personalized support from designated clinical staff supervised by his physician, including individualized plan of care and coordination with other care providers 24/7 contact phone numbers for assistance for urgent and routine care needs. The patient may stop case management services at any time by phone call to the office staff.  Patient agreed to services and consent obtained.   Assessment: Review of patient past medical history, allergies, medications, health status, including review of consultants reports, laboratory and other test data, was performed as part of comprehensive evaluation and provision of chronic care management services.   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 No structured activity noted.]  Stress Interventions Intervention Not Indicated  Social Connections Interventions Other (Comment)  [Good support system]  Transportation Interventions Intervention Not  Indicated        Care Plan  No Known Allergies  Outpatient Encounter Medications as of 01/12/2021  Medication Sig   amLODipine (NORVASC) 5 MG tablet Take 1 tablet (5 mg total) by mouth daily.   aspirin EC 81 MG tablet Take 81 mg by mouth.    atorvastatin (LIPITOR) 20 MG tablet Take 1 tablet (20 mg total) by mouth daily.   blood glucose meter kit and supplies KIT Dispense based on patient and insurance preference. Use up to four times daily as directed. 100 strips and lancets please   Blood Glucose Monitoring Suppl (CONTOUR NEXT EZ) w/Device KIT USE UP TO FOUR TIMES DAILY AS DIRECTED. (FOR ICD-10 E10.9, E11.9).   CONTOUR NEXT TEST test strip USE AS DIRECTED UPTO 4 TIMES A DAY   empagliflozin (JARDIANCE) 10 MG TABS tablet Take 1 tablet (10 mg total) by mouth daily before breakfast.   hydrochlorothiazide (HYDRODIURIL) 25 MG tablet Take 1 tablet (25 mg total) by mouth daily.   insulin degludec (TRESIBA) 100 UNIT/ML FlexTouch Pen Inject 20 Units into the skin daily.   Insulin Pen Needle 32G X 4 MM MISC 1 Units by Does not apply route every morning. Pen needles   lisinopril (ZESTRIL) 20 MG tablet Take 1 tablet (20 mg total) by mouth daily.   metFORMIN (GLUCOPHAGE) 1000 MG tablet Take 1 tablet (1,000 mg total) by mouth 2 (two) times daily with a meal.   tadalafil (CIALIS) 5 MG tablet Take 1 tablet (5 mg total) by mouth daily as needed for erectile dysfunction.   No facility-administered encounter medications on file as of 01/12/2021.    Patient Active Problem List   Diagnosis Date Noted   Overweight (BMI 25.0-29.9) 09/13/2020   COPD (chronic obstructive pulmonary disease) ([place]) 06/14/2020   Hyperlipidemia 03/23/2020  Erectile dysfunction 03/22/2020   Type 2 diabetes mellitus ([place]) 08/23/2017   Gastroesophageal reflux disease 12/07/2014   Essential hypertension 12/07/2014   History of rectal cancer 12/07/2014    Conditions to be addressed/monitored: HTN, HLD, DMII, and Tobacco  Use  Care Plan : RNCM: Diabetes Type 2 (Adult)  Updates made by Anthony Ingles, RN since 01/12/2021 12:00 AM  Completed 01/12/2021   Problem: RNCM: Glycemic Management (Diabetes, Type 2) Resolved 01/12/2021  Priority: Medium     Long-Range Goal: RNCM: Glycemic Management Optimized Completed 01/12/2021  Start Date: 06/06/2020  Expected End Date: 08/13/2021  Recent Progress: On track  Priority: High  Note:   Objective: Resolving duplicate goal Lab Results  Component Value Date   HGBA1C 6.6 (A) 09/13/2020    Lab Results  Component Value Date   CREATININE 1.21 06/02/2020   CREATININE 1.47 (H) 03/22/2020   CREATININE 1.55 (H) 02/02/2018   No results found for: EGFR Current Barriers:  Knowledge Deficits related to basic Diabetes pathophysiology and self care/management Knowledge Deficits related to medications used for management of diabetes Does not have glucometer to monitor blood sugar- meter ordered on 06-02-2020. 10-17-2020: The patient is consistently checking blood sugars at this time.  Limited Social Support Unable to independently manage DM Lacks social connections Does not maintain contact with provider office Does not contact provider office for questions/concerns Out of Lantus, went to pharmacy to get and they told him he would have to pay out of pocket- Has been out of Lantus x 2 days (12-15-2020) Case Manager Clinical Goal(s):  patient will demonstrate improved adherence to prescribed treatment plan for diabetes self care/management as evidenced by: daily monitoring and recording of CBG  adherence to ADA/ carb modified diet adherence to prescribed medication regimen contacting provider for new or worsened symptoms or questions Interventions:  Collaboration with Anthony Fenton, NP regarding development and update of comprehensive plan of care as evidenced by provider attestation and co-signature Inter-disciplinary care team collaboration (see longitudinal plan of  care) Provided education to patient about basic DM disease process. Education on checking blood sugars. The patient states his sister is supposed to bring him a meter. Co-collaboration with the pharmacist. Meter was ordered on 06-02-2020 by pcp. Pharm D will assist with meter use and medications management. Spoke with the patient about the importance of checking blood sugars and fasting blood sugar <130 and post prandial <180.  Also discussed goal of hemoglobin A1C of 7.0 or less. The patient verbalized understanding. The patient drinks Pepsi but has switched to water. Talked about no sugar options for drinks. The patient does not like diet pepsi. Offered other options for the patient to try. Expressed the benefits of controlled DM on organ systems. The patient is receptive to learning. 10-14-2020: The patient states that he is doing much better with managing his DM. He is not drinking Pepsi any more except when he needs a little taste and he will open one and get a taste and be done with it. He is mostly always drinking water.  He waits until he gets to work to eat breakfast. Denies any lows at this time. Says that his blood sugars are staying consistently at 120. Praised for newest hemoglobin A1C of 6.6 on 6.21-2022. 12-15-2020: The patient states that he is doing well but has a problem and was not able to get his insulin (Lantus), The patient went to the pharmacy and advised the to split pay the Lantus and they told him he would  have to pay out of pocket. The patient states that he could not do this and did not get the Lantus.  The patient wanted to know if he could get samples or what he needed to do. The RNCM contacted the office by secure chat. Pcp has left for the day but CMA advised that the office did not have any samples of Lantus. The RNCM called [facility] and ask about the issue with the Lantus and was told that his insurance does not prefer Lantus now but [place]. Information provided to the office staff. Will  wait to hear back from the pcp for the next steps for the patient. Call made back to the patient and advised the patient to take other medications as prescribed and the RNCM would reach back out to the patient once the [facility] was able to collaborate with the pcp. The patient verbalized understanding. The patient is concerned that he will not get what he needs. Emotional support provided. 12-16-2020: Correspondence from pcp that it was okay for the patient to take [place] instead of Lantus. The pcp [facility] [place] called the patient back and told the patient about the change in medications and instructed the patient to go to [facility] and pick up [place] and take as directed. The patient was at work and states he would go by and pick up medication. Will collaborate with the pharm D for follow up with the patient early next week to make sure the patient has new RX and there are no issues with the RX.  The patient expressed thanks for helping him get the medications he needs. Will continue to monitor and assist as needed to help patient maintain his DM health and well being.  Reviewed medications with patient and discussed importance of medication adherence. 08-08-2020: The patient is compliant with medications regimen. 10-17-2020: The patient states that he has his Jardiance now and he is compliant with all of his medications. 12-15-2020: Compliant with all medications except he is out of Lantus. Waiting for pcp determination for changes needed in medications regimen. 12-16-2020: Instructed the patient to discontinue use of Lantus (patient had been out x 2 days) and start taking the [place] as directed. The patient is to go [facility] in [place] to pick new medication up. Instructed the patient to call the office for any changes or issues related to getting [place]. Discussed plans with patient for ongoing care management follow up and provided patient with direct contact information for care  management team Provided patient with written educational materials related to hypo and hyperglycemia and importance of correct treatment. Reviewed with the patient the sx and sx and what to look for in changes. 12-15-2020: The patient states he is not having any low blood sugars. Review of sx and sx to look for. Will continue to monitor.  Reviewed scheduled/upcoming provider appointments including: pcp wants the patient to come back around 03-15-2021. Message sent to front office staff to call and make an appointment for annual physical for the patient. He will call for any other concerns. Advised patient, providing education and rationale, to check cbg BID and record, calling pcp for findings outside established parameters.  08-08-2020: The patient states he is doing well. His blood sugar this am was 129. He will have new blood work in June. Expects A1C to be trending down. 12-15-2020: Consistently checking blood sugars and states average is around 120.   Referral made to pharmacy team for assistance  with medication education, management, and help with the importance of taking blood sugars daily and managing blood sugars with medications and dietary constraints. 12-15-2020: The patient is working with pharm D for medications management.  Review of patient status, including review of consultants reports, relevant laboratory and other test results, and medications completed. Self-Care Activities - UNABLE to independently manage DM as evidence of hemoglobin A1C of 14, 10-17-2020: Newest hemoglobin A1C of 6.6 on 09-13-2020- praised for accomplishments  Self administers oral medications as prescribed Attends all scheduled provider appointments Checks blood sugars as prescribed and utilize hyper and hypoglycemia protocol as needed Adheres to prescribed ADA/carb modified Patient Goals: - check blood sugar at prescribed times - check blood sugar before and after exercise - check blood sugar if I feel it is too  high or too low - enter blood sugar readings and medication or insulin into daily log - take the blood sugar log to all doctor visits - take the blood sugar meter to all doctor visits - change to whole grain breads, cereal, pasta - drink 6 to 8 glasses of water each day - fill half of plate with vegetables - limit fast food meals to no more than 1 per week - manage portion size - prepare main meal at home 3 to 5 days each week - read food labels for fat, fiber, carbohydrates and portion size - switch to low-fat or skim milk - switch to sugar-free drinks - schedule appointment with eye doctor - check feet daily for cuts, sores or redness - do heel pump exercise 2 to 3 times each day - keep feet up while sitting - trim toenails straight across - wash and dry feet carefully every day - wear comfortable, cotton socks - wear comfortable, well-fitting shoes barriers to adherence to treatment plan identified - blood glucose monitoring encouraged - blood glucose readings reviewed - counseling by pharmacist provided- pharm D-co collaboration. Pharm D will contact the patient for help with effective management of DM and other chronic conditions  - individualized medical nutrition therapy provided - mutual A1C goal set or reviewed - resources required to improve adherence to care identified - self-awareness of signs/symptoms of hypo or hyperglycemia encouraged - use of blood glucose monitoring log promoted- to send log by mail to the patient.    Follow Up Plan: Telephone follow up appointment with care management team member scheduled for: 01-12-2021 at 230 pm and will follow up after collaboration with pcp and pharm D, follow up made on 12-16-2020. Will check with pharm D for further follow ups or needs before next outreach.     Care Plan : RNCM: Hypertension (Adult)  Updates made by Anthony Ingles, RN since 01/12/2021 12:00 AM  Completed 01/12/2021   Problem: RNCM: Hypertension  (Hypertension) Resolved 01/12/2021  Priority: Medium     Long-Range Goal: RNCM: Hypertension Monitored Completed 01/12/2021  Start Date: 06/06/2020  Expected End Date: 08/13/2021  Recent Progress: On track  Priority: Medium  Note:   Objective: Resolving duplicate goal  Last practice recorded BP readings:  BP Readings from Last 3 Encounters:  10/17/20 (!) 120/40  09/13/20 (!) 121/49  07/12/20 129/60   Most recent eGFR/CrCl: No results found for: EGFR  No components found for: CRCL Current Barriers:  Knowledge Deficits related to basic understanding of hypertension pathophysiology and self care management Knowledge Deficits related to understanding of medications prescribed for management of hypertension Difficulty obtaining medications Non-adherence to prescribed medication regimen Limited Social Designer, multimedia.  Unable to independently manage HTN Does not adhere to prescribed medication regimen Lacks social connections Does not contact provider office for questions/concerns Case Manager Clinical Goal(s):  patient will verbalize understanding of plan for hypertension management patient will attend all scheduled medical appointments: office to call and secure an appointment for 02-2021 patient will demonstrate improved adherence to prescribed treatment plan for hypertension as evidenced by taking all medications as prescribed, monitoring and recording blood pressure as directed, adhering to low sodium/DASH diet patient will demonstrate improved health management independence as evidenced by checking blood pressure as directed and notifying PCP if SBP>160 or DBP > 90, taking all medications as prescribe, and adhering to a low sodium diet as discussed. patient will verbalize basic understanding of hypertension disease process and self health management plan as evidenced by compliance with  heart healthy/ADA diet, compliance with medications and working with the CCM team to  optimize health and well being.  Interventions:  Collaboration with Anthony Fenton, NP regarding development and update of comprehensive plan of care as evidenced by provider attestation and co-signature Inter-disciplinary care team collaboration (see longitudinal plan of care) UNABLE to independently:manage HTN effectively  Evaluation of current treatment plan related to hypertension self management and patient's adherence to plan as established by provider. 12-15-2020: Reviewed with the patient if the patient is taking amlodipine now and the patient confirms compliance. Education on taking medications and if he runs out to let the [facility] or pharm D know. The patient verbalized understanding.  Provided education to patient re: stroke prevention, s/s of heart attack and stroke, DASH diet, complications of uncontrolled blood pressure Reviewed medications with patient and discussed importance of compliance Discussed plans with patient for ongoing care management follow up and provided patient with direct contact information for care management team Advised patient, providing education and rationale, to monitor blood pressure daily and record, calling PCP for findings outside established parameters.  Self-Care Activities: - Self administers medications as prescribed Attends all scheduled provider appointments Calls provider office for new concerns, questions, or BP outside discussed parameters Checks BP and records as discussed Follows a low sodium diet/DASH diet Patient Goals: - check blood pressure 3 times per week - choose a place to take my blood pressure (home, clinic or office, retail store) - write blood pressure results in a log or diary - agree on reward when goals are met - agree to work together to make changes - ask questions to understand - have a family meeting to talk about healthy habits - learn about high blood pressure  Follow Up Plan: Telephone follow up appointment with care  management team member scheduled for: 01-12-2021 at 230 pm    Care Plan : RNCM: HLD Management  Updates made by Anthony Ingles, RN since 01/12/2021 12:00 AM  Completed 01/12/2021   Problem: RNCM: Management of HLD Resolved 01/12/2021  Priority: Medium     Long-Range Goal: RNCM: Management of HLD Completed 01/12/2021  Start Date: 06/06/2020  Expected End Date: 08/13/2021  Recent Progress: On track  Priority: Medium  Note:   Current Barriers: Resolving duplicate goal Poorly controlled hyperlipidemia, complicated by uncontrolled DM Current antihyperlipidemic regimen: Lipitor 20 Mg QD Most recent lipid panel:  Lab Results  Component Value Date   CHOL 125 09/20/2020   CHOL 235 (H) 03/22/2020   Lab Results  Component Value Date   HDL 34 (L) 09/20/2020   HDL 36 (L) 03/22/2020   Lab Results  Component Value Date   LDLCALC 70  09/20/2020   LDLCALC 158 (H) 03/22/2020   Lab Results  Component Value Date   TRIG 119 09/20/2020   TRIG 242 (H) 03/22/2020   Lab Results  Component Value Date   CHOLHDL 3.7 09/20/2020   CHOLHDL 6.5 (H) 03/22/2020   No results found for: LDLDIRECT  ASCVD risk enhancing conditions: age 72,  DM, HTN, current smoker Unable to independently manage chronic conditons  Lacks social connections Does not maintain contact with provider office Does not contact provider office for [place]):  patient will work with Consulting civil engineer, providers, and care team towards execution of optimized self-health management plan patient will verbalize understanding of plan for effective management of HLD  patient will work with [facility], CCM team  to address needs related to HLD  patient will attend all scheduled medical appointments: no upcoming appointments, knows to call for needs. Interventions: Collaboration with Anthony Fenton, NP regarding development and update of comprehensive plan of care as evidenced by provider attestation  and co-signature Inter-disciplinary care team collaboration (see longitudinal plan of care) Medication review performed; medication list updated in electronic medical record.  Inter-disciplinary care team collaboration (see longitudinal plan of care) Referred to pharmacy team for assistance with HLD medication management Evaluation of current treatment plan related to HLD  and patient's adherence to plan as established by provider. 12-15-2020: The patient is doing well and denies any issues related to HLD. States he is feeling great and eating good. He is being mindful of his health and is so thankful for the CCM team checking on him.  Advised patient to call the office for changes or questions Provided education to patient re: heart healthy/ADA diet. 12-15-2020: States compliance with heart healthy/ADA diet. Denies any new concerns at this time.  Reviewed medications with patient and discussed compliance/cost constraints/questions. 12-15-2020: States that he is taking medications as prescribed  Provided patient with dietary restriction  educational materials related to Heart healthy/ADA diet and the benefits of controlling chronic conditions to promote health and well being.  Reviewed scheduled/upcoming provider appointments including: office to call with appointment for 02-2021 Discussed plans with patient for ongoing care management follow up and provided patient with direct contact information for care management team Patient Goals/Self-Care Activities: - call for medicine refill 2 or 3 days before it runs out - call if I am sick and can't take my medicine - keep a list of all the medicines I take; vitamins and herbals too - learn to read medicine labels - use a pillbox to sort medicine - use an alarm clock or phone to remind me to take my medicine - change to whole grain breads, cereal, pasta - drink 6 to 8 glasses of water each day - eat 3 to 5 servings of fruits and vegetables each day -  eat 5 or 6 small meals each day - fill half the plate with nonstarchy vegetables - limit fast food meals to no more than 1 per week - manage portion size - prepare main meal at home 3 to 5 days each week - read food labels for fat, fiber, carbohydrates and portion size - switch to low-fat or skim milk - switch to sugar-free drinks - be open to making changes - I can manage, know and watch for signs of a heart attack - if I have chest pain, call for help - learn about small changes that will make a big difference - learn my personal risk factors  Follow  Up Plan: Telephone follow up appointment with care management team member scheduled for: 01-12-2021 at 230 pm      Care Plan : RNCM: General Plan of Care (Adult) for Chronic Disease Management and Care Coordination Needs  Updates made by Anthony Ingles, RN since 01/12/2021 12:00 AM     Problem: RNCM: Development of plan of Care for Chronic Disease Managment and Care Coordination Needs (HTN/HLD/DM)   Priority: High     Long-Range Goal: RNCM: Effective Management of plan of Care for Chronic Disease Managment and Care Coordination Needs (HTN/HLD/DM)   Start Date: 01/12/2021  Expected End Date: 01/12/2022  Priority: High  Note:   Current Barriers:  Knowledge Deficits related to plan of care for management of HTN, HLD, and DMII  Chronic Disease Management support and education needs related to HTN, HLD, and DMII  RNCM Clinical Goal(s):  Patient will verbalize understanding of plan for management of HTN, HLD, and DMII  verbalize basic understanding of HTN, HLD, and DMII disease process and self health management plan   take all medications exactly as prescribed and will call provider for medication related questions demonstrate understanding of rationale for each prescribed medication   attend all scheduled medical appointments: the patient has no upcoming appointments with the pcp but knows to call for changes  demonstrate improved  and ongoing adherence to prescribed treatment plan for HTN, HLD, and DMII as evidenced by daily monitoring and recording of CBG  adherence to ADA/ carb modified diet adherence to prescribed medication regimen contacting provider for new or worsened symptoms or questions   demonstrate improved and ongoing health management independence   work with pharmacist to address Medication procurement related to HTN, HLD, and DMII demonstrate ongoing self health care management ability for effective management of chronic conditions   through collaboration with RN Care manager, provider, and care team.   Interventions: 1:1 collaboration with primary care provider regarding development and update of comprehensive plan of care as evidenced by provider attestation and co-signature Inter-disciplinary care team collaboration (see longitudinal plan of care) Evaluation of current treatment plan related to  self management and patient's adherence to plan as established by provider   SDOH Barriers (Status: Goal on track: YES.)  Patient interviewed and SDOH assessment performed        SDOH Interventions    Flowsheet Row Most Recent Value  SDOH Interventions   Food Insecurity Interventions Intervention Not Indicated  Financial Strain Interventions Intervention Not Indicated  Housing Interventions Intervention Not Indicated  Intimate Partner Violence Interventions Intervention Not Indicated  Physical Activity Interventions Other (Comments)  [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 No structured activity noted.]  Stress Interventions Intervention Not Indicated  Social Connections Interventions Other (Comment)  [Good support system]  Transportation Interventions Intervention Not Indicated     Patient interviewed and appropriate assessments performed Provided patient with information about resources available for help with expressed needs in [place] and the availability of Care guides as  needed  Advised patient to call  the office for changes in [place], Questions, or concerns    Diabetes:  (Status: Goal on track: YES.) Lab Results  Component Value Date   HGBA1C 6.6 (A) 09/13/2020  Assessed patient's understanding of A1c goal: <7% Provided education to patient about basic DM disease process; Reviewed medications with patient and discussed importance of medication adherence. 01-12-2021: The patient states he has the [place] and it is working well for him. He states he is still taking 20 units daily. The patient  has not decreased the [place] any. Has parameters to decrease [place] 2 units every 7 days for blood sugars <95. The patient states he has not had any lows and he is going to keep the dose at 20 units right now. Continues to work with the pharm D for ongoing support and management of medications.         Reviewed prescribed diet with patient heart healthy/ADA diet. 01-12-2021: Is compliant with heart healthy diet; Counseled on importance of regular laboratory monitoring as prescribed;        Discussed plans with patient for ongoing care management follow up and provided patient with direct contact information for care management team;      Provided patient with written educational materials related to hypo and hyperglycemia and importance of correct treatment. 01-12-2021: The patient denies any lows at this time. Review of sx and sx of hypoglycemia and hyperglycemia. Will continue to monitor for changes        Reviewed scheduled/upcoming provider appointments including: no upcoming appointments, knows to call the office for changes ;         Advised patient, providing education and rationale, to check cbg BID and record        call provider for findings outside established parameters. 01-12-2021: States that his blood sugar range has been 118 to 120;       Referral made to pharmacy team for assistance with ongoing support and education related to medications for management of chronic conditions;        Review of patient status, including review of consultants reports, relevant laboratory and other test results, and medications completed;       Screening for signs and symptoms of depression related to chronic disease state;        Assessed social determinant of health barriers;         Hyperlipidemia:  (Status: Goal on track: YES.) Lab Results  Component Value Date   CHOL 125 09/20/2020   HDL 34 (L) 09/20/2020   LDLCALC 70 09/20/2020   TRIG 119 09/20/2020   CHOLHDL 3.7 09/20/2020     Medication review performed; medication list updated in electronic medical record.  Provider established cholesterol goals reviewed; Counseled on importance of regular laboratory monitoring as prescribed; Provided HLD educational materials; Reviewed role and benefits of statin for ASCVD risk reduction; Discussed strategies to manage statin-induced myalgias; Reviewed importance of limiting foods high in cholesterol;  Hypertension: (Status: Goal on track: YES.) Last practice recorded BP readings:  BP Readings from Last 3 Encounters:  10/17/20 (!) 120/40  09/13/20 (!) 121/49  07/12/20 129/60  Most recent eGFR/CrCl: No results found for: EGFR  No components found for: CRCL  Evaluation of current treatment plan related to hypertension self management and patient's adherence to plan as established by provider;   Provided education to patient re: stroke prevention, s/s of heart attack and stroke; Reviewed prescribed diet Heart healthy/ADA diet Heart Healthy/ADA Reviewed medications with patient and discussed importance of compliance. 01-12-2021: The patient states she is compliant with medications  Counseled on adverse effects of illicit drug and excessive alcohol use in patients with high blood pressure;  Discussed plans with patient for ongoing care management follow up and provided patient with direct contact information for care management team; Advised patient, providing education and rationale, to  monitor blood pressure daily and record, calling PCP for findings outside established parameters;  Provided education on prescribed diet Heart Healthy/ADA diet ;  Discussed complications of poorly controlled blood  pressure such as heart disease, stroke, circulatory complications, vision complications, kidney impairment, sexual dysfunction;   Smoking Cessation: (Status: Goal on track: NO.) Reviewed smoking history:  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 01-12-2021: The patient states that he smokes 10 to 15 cigarettes daily. He is not interested in smoking cessation at this time. Knows there are available resources when he is ready to quit.  Advised patient to discuss smoking cessation options with provider; Provided contact information for [place] [facility] (1-800-QUIT-NOW); Provided patient with printed smoking cessation educational materials; Discussed plans with patient for ongoing care management follow up and provided patient with direct contact information for care management team;  Patient Goals/Self-Care Activities: Patient will self administer medications as prescribed as evidenced by self report/primary caregiver report  Patient will attend all scheduled provider appointments as evidenced by clinician review of documented attendance to scheduled appointments and patient/caregiver report Patient will call pharmacy for medication refills as evidenced by patient report and review of pharmacy fill history as appropriate Patient will attend church or other social activities as  evidenced by patient report Patient will continue to perform ADL's independently as evidenced by patient/caregiver report Patient will continue to perform IADL's independently as evidenced by patient/caregiver report Patient will call provider office for new concerns or questions as evidenced by review of documented incoming telephone call notes and patient report Patient will work with BSW to address care coordination needs and will continue to work with the clinical team to address health care and disease management related needs as evidenced by documented adherence to scheduled care management/care coordination appointments - keep appointment with eye doctor - schedule appointment with eye doctor - check blood sugar at prescribed times: before meals and at bedtime, when you have symptoms of low or high blood sugar, and before and after exercise - check feet daily for cuts, sores or redness - enter blood sugar readings and medication or insulin into daily log - take the blood sugar log to all doctor visits - trim toenails straight across - drink 6 to 8 glasses of water each day - eat fish at least once per week - fill half of plate with vegetables - limit fast food meals to no more than 1 per week - manage portion size - prepare main meal at home 3 to 5 days each week - read food labels for fat, fiber, carbohydrates and portion size - reduce red meat to 2 to 3 times a week - keep feet up while sitting - wash and dry feet carefully every day - wear comfortable, cotton socks - wear comfortable, well-fitting shoes - check blood pressure weekly - choose a place to take my blood pressure (home, clinic or office, retail store) - learn about high blood pressure - keep a blood pressure log - call doctor for signs and symptoms of high blood pressure - develop an action plan for high blood pressure - keep all doctor appointments - take medications for blood pressure exactly as prescribed -  call for medicine refill 2 or 3 days before it runs out - take  all medications exactly as prescribed - call doctor with any symptoms you believe are related to your medicine - call doctor when you experience any new symptoms - go to all doctor appointments as scheduled - adhere to prescribed diet: Heart Healthy/ADA        Plan: Telephone follow up appointment with care management team member scheduled for:  02-23-2021 at 345 pm  [place], MSN, [facility] Mobile: [phone]

## 2021-01-25 ENCOUNTER — Telehealth: Payer: Self-pay | Admitting: Pharmacist

## 2021-01-25 ENCOUNTER — Telehealth: Payer: BC Managed Care – PPO

## 2021-01-25 NOTE — Telephone Encounter (Signed)
  Chronic Care Management   Outreach Note  01/25/2021 Name: Anthony Bell MRN: [id number] DOB: 04-04-1955  Referred by: [name], NP Reason for referral : No chief complaint on file.   Was unable to reach patient via telephone today and have left HIPAA compliant voicemail asking patient to return my call.    Follow Up Plan: Will collaborate with Care Guide to outreach to schedule follow up with me  Harlow Asa, PharmD, [facility] [phone]

## 2021-02-01 NOTE — Telephone Encounter (Signed)
Pt has been rescheduled. 

## 2021-02-07 ENCOUNTER — Other Ambulatory Visit: Payer: Self-pay | Admitting: Internal Medicine

## 2021-02-07 DIAGNOSIS — I10 Essential (primary) hypertension: Secondary | ICD-10-CM

## 2021-02-07 NOTE — Telephone Encounter (Signed)
Requested Prescriptions  Pending Prescriptions Disp Refills  . hydrochlorothiazide (HYDRODIURIL) 25 MG tablet [Pharmacy Med Name: HYDROCHLOROTHIAZIDE 25 MG TAB] 90 tablet 0    Sig: TAKE 1 TABLET (25 MG TOTAL) BY MOUTH DAILY.     Cardiovascular: Diuretics - Thiazide Failed - 02/07/2021  1:28 AM      Failed - Cr in normal range and within 360 days    Creat  Date Value Ref Range Status  09/20/2020 1.26 (H) 0.70 - 1.25 mg/dL Final    Comment:    For patients >30 years of age, the reference limit for Creatinine is approximately 13% higher for people identified as African-American. .          Failed - Last BP in normal range    BP Readings from Last 1 Encounters:  10/17/20 (!) 120/40         Passed - Ca in normal range and within 360 days    Calcium  Date Value Ref Range Status  09/20/2020 9.6 8.6 - 10.3 mg/dL Final   Calcium, Total  Date Value Ref Range Status  10/10/2011 9.2 8.5 - 10.1 mg/dL Final         Passed - K in normal range and within 360 days    Potassium  Date Value Ref Range Status  09/20/2020 3.5 3.5 - 5.3 mmol/L Final  10/10/2011 4.3 3.5 - 5.1 mmol/L Final         Passed - Na in normal range and within 360 days    Sodium  Date Value Ref Range Status  09/20/2020 141 135 - 146 mmol/L Final  10/10/2011 144 136 - 145 mmol/L Final         Passed - Valid encounter within last 6 months    Recent Outpatient Visits          4 months ago Type 2 diabetes mellitus with hyperglycemia, without long-term current use of insulin ([place])   [facility] [place], [name], NP   7 months ago Depression, unspecified depression type   [facility] [name], NP   7 months ago Type 2 diabetes mellitus with hyperglycemia, without long-term current use of insulin [facility])   [facility] [name], NP   8 months ago Type 2 diabetes mellitus with hyperglycemia, without long-term current use of insulin [facility])   [facility] [name], NP   8 months ago Type 2 diabetes mellitus with hyperglycemia, without long-term current use of insulin [facility])   [facility] [name], NP

## 2021-02-23 ENCOUNTER — Telehealth: Payer: BC Managed Care – PPO

## 2021-02-23 ENCOUNTER — Telehealth: Payer: Self-pay

## 2021-02-23 NOTE — Telephone Encounter (Signed)
  Care Management   Follow Up Note   02/23/2021 Name: Anthony Bell MRN: [id number] DOB: 12/17/1955   Referred by: [name], NP Reason for referral : Care Coordination (RNCM: Follow up for Chronic Disease Management and Care Coordination Needs )   An unsuccessful telephone outreach was attempted today. The patient was referred to the case management team for assistance with care management and care coordination.   Follow Up Plan: A HIPPA compliant phone message was left for the patient providing contact information and requesting a return call.   Noreene Larsson RN, MSN, [place] Mobile: [phone]

## 2021-03-23 ENCOUNTER — Telehealth: Payer: BC Managed Care – PPO

## 2021-03-23 ENCOUNTER — Ambulatory Visit: Payer: Self-pay

## 2021-03-23 DIAGNOSIS — I10 Essential (primary) hypertension: Secondary | ICD-10-CM

## 2021-03-23 DIAGNOSIS — M79602 Pain in left arm: Secondary | ICD-10-CM

## 2021-03-23 DIAGNOSIS — E782 Mixed hyperlipidemia: Secondary | ICD-10-CM

## 2021-03-23 DIAGNOSIS — Z720 Tobacco use: Secondary | ICD-10-CM

## 2021-03-23 DIAGNOSIS — Z794 Long term (current) use of insulin: Secondary | ICD-10-CM

## 2021-03-23 NOTE — Chronic Care Management (AMB) (Signed)
Care Management    RN Visit Note  03/23/2021 Name: Anthony Bell MRN: [id number] DOB: 07/31/1955  Subjective: Anthony Bell is a 65 y.o. year old male who is a primary care patient of [name], NP. The care management team was consulted for assistance with disease management and care coordination needs.    Engaged with patient by telephone for follow up visit in response to provider referral for case management and/or care coordination services.   Consent to Services:   Mr. Belland was given information about Care Management services today including:  Care Management services includes personalized support from designated clinical staff supervised by his physician, including individualized plan of care and coordination with other care providers 24/7 contact phone numbers for assistance for urgent and routine care needs. The patient may stop case management services at any time by phone call to the office staff.  Patient agreed to services and consent obtained.   Assessment: Review of patient past medical history, allergies, medications, health status, including review of consultants reports, laboratory and other test data, was performed as part of comprehensive evaluation and provision of chronic care management services.   SDOH (Social Determinants of Health) assessments and interventions performed:    Care Plan  No Known Allergies  Outpatient Encounter Medications as of 03/23/2021  Medication Sig   amLODipine (NORVASC) 5 MG tablet Take 1 tablet (5 mg total) by mouth daily.   aspirin EC 81 MG tablet Take 81 mg by mouth.    atorvastatin (LIPITOR) 20 MG tablet Take 1 tablet (20 mg total) by mouth daily.   blood glucose meter kit and supplies KIT Dispense based on patient and insurance preference. Use up to four times daily as directed. 100 strips and lancets please   Blood Glucose Monitoring Suppl (CONTOUR NEXT EZ) w/Device KIT USE UP TO FOUR TIMES DAILY AS DIRECTED. (FOR  ICD-10 E10.9, E11.9).   CONTOUR NEXT TEST test strip USE AS DIRECTED UPTO 4 TIMES A DAY   empagliflozin (JARDIANCE) 10 MG TABS tablet Take 1 tablet (10 mg total) by mouth daily before breakfast.   hydrochlorothiazide (HYDRODIURIL) 25 MG tablet TAKE 1 TABLET (25 MG TOTAL) BY MOUTH DAILY.   insulin degludec (TRESIBA) 100 UNIT/ML FlexTouch Pen Inject 20 Units into the skin daily.   Insulin Pen Needle 32G X 4 MM MISC 1 Units by Does not apply route every morning. Pen needles   lisinopril (ZESTRIL) 20 MG tablet Take 1 tablet (20 mg total) by mouth daily.   metFORMIN (GLUCOPHAGE) 1000 MG tablet Take 1 tablet (1,000 mg total) by mouth 2 (two) times daily with a meal.   tadalafil (CIALIS) 5 MG tablet Take 1 tablet (5 mg total) by mouth daily as needed for erectile dysfunction.   No facility-administered encounter medications on file as of 03/23/2021.    Patient Active Problem List   Diagnosis Date Noted   Overweight (BMI 25.0-29.9) 09/13/2020   COPD (chronic obstructive pulmonary disease) ([place]) 06/14/2020   Hyperlipidemia 03/23/2020   Erectile dysfunction 03/22/2020   Type 2 diabetes mellitus ([place]) 08/23/2017   Gastroesophageal reflux disease 12/07/2014   Essential hypertension 12/07/2014   History of rectal cancer 12/07/2014    Conditions to be addressed/monitored: HTN, HLD, DMII, Tobacco Use, and Chronic pain   Care Plan : RNCM: General Plan of Care (Adult) for Chronic Disease Management and Care Coordination Needs  Updates made by Vanita Ingles, RN since 03/23/2021 12:00 AM     Problem: RNCM: Development of plan of  Care for Chronic Disease Managment and Care Coordination Needs (HTN/HLD/DM)   Priority: High     Long-Range Goal: RNCM: Effective Management of plan of Care for Chronic Disease Managment and Care Coordination Needs (HTN/HLD/DM)   Start Date: 01/12/2021  Expected End Date: 01/12/2022  Priority: High  Note:   Current Barriers:  Knowledge Deficits related to plan of care  for management of HTN, HLD, and DMII, left arm pain  Chronic Disease Management support and education needs related to HTN, HLD, and DMII, left arm pain  RNCM Clinical Goal(s):  Patient will verbalize understanding of plan for management of HTN, HLD, and DMII, left arm pain  verbalize basic understanding of HTN, HLD, and DMII, left arm pain  disease process and self health management plan  take all medications exactly as prescribed and will call provider for medication related questions demonstrate understanding of rationale for each prescribed medication  attend all scheduled medical appointments: the patient has no upcoming appointments with the pcp but knows to call for changes  demonstrate improved and ongoing adherence to prescribed treatment plan for HTN, HLD, and DMII, left arm pain as evidenced by daily monitoring and recording of CBG  adherence to ADA/ carb modified diet adherence to prescribed medication regimen contacting provider for new or worsened symptoms or questions  demonstrate improved and ongoing health management independence  work with pharmacist to address Medication procurement related to HTN, HLD, and DMII. Left arm pain demonstrate ongoing self health care management ability for effective management of chronic conditions  through collaboration with RN Care manager, provider, and care team.   Interventions: 1:1 collaboration with primary care provider regarding development and update of comprehensive plan of care as evidenced by provider attestation and co-signature Inter-disciplinary care team collaboration (see longitudinal plan of care) Evaluation of current treatment plan related to  self management and patient's adherence to plan as established by provider   SDOH Barriers (Status: Goal on track: YES.)  Patient interviewed and SDOH assessment performed        SDOH Interventions    Flowsheet Row Most Recent Value  SDOH Interventions   Food Insecurity  Interventions Intervention Not Indicated  Financial Strain Interventions Intervention Not Indicated  Housing Interventions Intervention Not Indicated  Intimate Partner Violence Interventions Intervention Not Indicated  Physical Activity Interventions Other (Comments)  [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 No structured activity noted.]  Stress Interventions Intervention Not Indicated  Social Connections Interventions Other (Comment)  [Good support system]  Transportation Interventions Intervention Not Indicated     Patient interviewed and appropriate assessments performed Provided patient with information about resources available for help with expressed needs in [place] and the availability of Care guides as  needed  Advised patient to call the office for changes in [place], Questions, or concerns    Diabetes:  (Status: Goal on track: YES.) Lab Results  Component Value Date   HGBA1C 6.6 (A) 09/13/2020  Assessed patient's understanding of A1c goal: <7% Provided education to patient about basic DM disease process; Reviewed medications with patient and discussed importance of medication adherence. 01-12-2021: The patient states he has the [place] and it is working well for him. He states he is still taking 20 units daily. The patient has not decreased the [place] any. Has parameters to decrease [place] 2 units every 7 days for blood sugars <95. The patient states he has not had any lows and he is going to keep the dose at 20 units right now. Continues to work with the  pharm D for ongoing support and management of medications.  03-23-2021: The patient has his medications and is taking as prescribed        Reviewed prescribed diet with patient heart healthy/ADA diet. 03-23-2021: Is compliant with heart healthy diet; Counseled on importance of regular laboratory monitoring as prescribed;        Discussed plans with patient for ongoing care management follow up and provided patient with direct contact  information for care management team;      Provided patient with written educational materials related to hypo and hyperglycemia and importance of correct treatment. 03-23-2021: The patient denies any lows at this time. Review of sx and sx of hypoglycemia and hyperglycemia. Will continue to monitor for changes        Reviewed scheduled/upcoming provider appointments including: 03-30-2021 at 340 pm knows to call the office for changes ;         Advised patient, providing education and rationale, to check cbg BID and record. 03-23-2021: The patient states that his blood sugars are 96 to 98 range and staying this consistently        call provider for findings outside established parameters. 01-12-2021: States that his blood sugar range has been 118 to 120; 03-23-2021: Range has been 96 to 98 consistently      Referral made to pharmacy team for assistance with ongoing support and education related to medications for management of chronic conditions;       Review of patient status, including review of consultants reports, relevant laboratory and other test results, and medications completed;       Screening for signs and symptoms of depression related to chronic disease state;        Assessed social determinant of health barriers;         Hyperlipidemia:  (Status: Goal on track: YES.) Lab Results  Component Value Date   CHOL 125 09/20/2020   HDL 34 (L) 09/20/2020   LDLCALC 70 09/20/2020   TRIG 119 09/20/2020   CHOLHDL 3.7 09/20/2020     Medication review performed; medication list updated in electronic medical record.  Provider established cholesterol goals reviewed; Counseled on importance of regular laboratory monitoring as prescribed. 03-23-2021: The patient has regular lab work done. Denies any acute findings  Provided HLD educational materials; Reviewed role and benefits of statin for ASCVD risk reduction; Discussed strategies to manage statin-induced myalgias; Reviewed importance of limiting  foods high in cholesterol;  Hypertension: (Status: Goal on track: YES.) Last practice recorded BP readings:  BP Readings from Last 3 Encounters:  10/17/20 (!) 120/40  09/13/20 (!) 121/49  07/12/20 129/60  Most recent eGFR/CrCl: No results found for: EGFR  No components found for: CRCL  Evaluation of current treatment plan related to hypertension self management and patient's adherence to plan as established by provider. 03-23-2021: The patient denies any issues with HTN or heart health. Feels he is overall doing well;   Provided education to patient re: stroke prevention, s/s of heart attack and stroke; Reviewed prescribed diet Heart healthy/ADA diet Heart Healthy/ADA Reviewed medications with patient and discussed importance of compliance. 03-23-2021: The patient states he is compliant with medications  Counseled on adverse effects of illicit drug and excessive alcohol use in patients with high blood pressure;  Discussed plans with patient for ongoing care management follow up and provided patient with direct contact information for care management team; Advised patient, providing education and rationale, to monitor blood pressure daily and record, calling PCP for findings outside  established parameters;  Provided education on prescribed diet Heart Healthy/ADA diet ;  Discussed complications of poorly controlled blood pressure such as heart disease, stroke, circulatory complications, vision complications, kidney impairment, sexual dysfunction;   Smoking Cessation: (Status: Goal on track: NO.) Reviewed smoking history:  tobacco abuse of 40 years; currently smoking 1 ppd Previous quit attempts, unsuccessful 0 successful using 0  Reports smoking within 30 minutes of waking up Reports triggers to smoke include: stress relief Reports motivation to quit smoking includes: knows that quitting would be better on his health On a scale of 1-10, reports MOTIVATION to quit is 1 On a scale of 1-10,  reports CONFIDENCE in quitting is 1  Evaluation of current treatment plan reviewed. 01-12-2021: The patient states that he smokes 10 to 15 cigarettes daily. He is not interested in smoking cessation at this time. Knows there are available resources when he is ready to quit.  Advised patient to discuss smoking cessation options with provider; Provided contact information for [place] [facility] (1-800-QUIT-NOW); Provided patient with printed smoking cessation educational materials; Discussed plans with patient for ongoing care management follow up and provided patient with direct contact information for care management team;   Pain:  (Status: Goal on Track (progressing): YES.) Long Term Goal  Pain assessment performed. 03-23-2021: The patient denies pain right now but states once he has settled down from work and he has taken his bath and eaten that his left arm starts to hurt and he is only getting about 3 hours a night of sleep. He states that once he gets up and is moving around it is fine. He has tried icy hot and a heating pad and it helps minimally. He states his mother had issues with arthritis and he likely does. He said when he was younger he pulled a lot of tobacco. He was taking OTC pain meds but stopped when he felt like that was not helping either. Is open for recommendations from the provider.  Medications reviewed. 03-23-2021: Is taking OTC pain medications with minimal relief Reviewed provider established plan for pain management; Discussed importance of adherence to all scheduled medical appointments. 03-23-2021: Assisted patient with securing an appointment for 03-30-2021 at 340 pm with pcp. Reviewed with the patient and the patient verbalized understanding of appointment Counseled on the importance of reporting any/all new or changed pain symptoms or management strategies to pain management provider; Advised patient to report to care team affect of pain on daily activities; Discussed use of  relaxation techniques and/or diversional activities to assist with pain reduction (distraction, imagery, relaxation, massage, acupressure, TENS, heat, and cold application; Reviewed with patient prescribed pharmacological and nonpharmacological pain relief strategies; Advised patient to discuss unresolved pain, changes in level or intensity of pain with provider; Screening for signs and symptoms of depression related to chronic disease state;  Assessed social determinant of health barriers;    Patient Goals/Self-Care Activities: Patient will self administer medications as prescribed as evidenced by self report/primary caregiver report  Patient will attend all scheduled provider appointments as evidenced by clinician review of documented attendance to scheduled appointments and patient/caregiver report Patient will call pharmacy for medication refills as evidenced by patient report and review of pharmacy fill history as appropriate Patient will attend church or other social activities as evidenced by patient report Patient will continue to perform ADL's independently as evidenced by patient/caregiver report Patient will continue to perform IADL's independently as evidenced by patient/caregiver report Patient will call provider office for new concerns or  questions as evidenced by review of documented incoming telephone call notes and patient report Patient will work with BSW to address care coordination needs and will continue to work with the clinical team to address health care and disease management related needs as evidenced by documented adherence to scheduled care management/care coordination appointments - keep appointment with eye doctor - schedule appointment with eye doctor - check blood sugar at prescribed times: before meals and at bedtime, when you have symptoms of low or high blood sugar, and before and after exercise - check feet daily for cuts, sores or redness - enter blood sugar  readings and medication or insulin into daily log - take the blood sugar log to all doctor visits - trim toenails straight across - drink 6 to 8 glasses of water each day - eat fish at least once per week - fill half of plate with vegetables - limit fast food meals to no more than 1 per week - manage portion size - prepare main meal at home 3 to 5 days each week - read food labels for fat, fiber, carbohydrates and portion size - reduce red meat to 2 to 3 times a week - keep feet up while sitting - wash and dry feet carefully every day - wear comfortable, cotton socks - wear comfortable, well-fitting shoes - check blood pressure weekly - choose a place to take my blood pressure (home, clinic or office, retail store) - learn about high blood pressure - keep a blood pressure log - call doctor for signs and symptoms of high blood pressure - develop an action plan for high blood pressure - keep all doctor appointments - take medications for blood pressure exactly as prescribed - call for medicine refill 2 or 3 days before it runs out - take all medications exactly as prescribed - call doctor with any symptoms you believe are related to your medicine - call doctor when you experience any new symptoms - go to all doctor appointments as scheduled - adhere to prescribed diet: Heart Healthy/ADA        Plan: Telephone follow up appointment with care management team member scheduled for:  05-11-2021 at 345 pm  [place], MSN, [facility] [place] Mobile: [phone]

## 2021-03-23 NOTE — Patient Instructions (Signed)
Visit Information  Thank you for taking time to visit with me today. Please don't hesitate to contact me if I can be of assistance to you before our next scheduled telephone appointment.  Following are the goals we discussed today:  RNCM Clinical Goal(s):  Patient will verbalize understanding of plan for management of HTN, HLD, and DMII, left arm pain  verbalize basic understanding of HTN, HLD, and DMII, left arm pain  disease process and self health management plan  take all medications exactly as prescribed and will call provider for medication related questions demonstrate understanding of rationale for each prescribed medication  attend all scheduled medical appointments: the patient has no upcoming appointments with the pcp but knows to call for changes  demonstrate improved and ongoing adherence to prescribed treatment plan for HTN, HLD, and DMII, left arm pain as evidenced by daily monitoring and recording of CBG  adherence to ADA/ carb modified diet adherence to prescribed medication regimen contacting provider for new or worsened symptoms or questions  demonstrate improved and ongoing health management independence  work with pharmacist to address Medication procurement related to HTN, HLD, and DMII. Left arm pain demonstrate ongoing self health care management ability for effective management of chronic conditions  through collaboration with RN Care manager, provider, and care team.    Interventions: 1:1 collaboration with primary care provider regarding development and update of comprehensive plan of care as evidenced by provider attestation and co-signature Inter-disciplinary care team collaboration (see longitudinal plan of care) Evaluation of current treatment plan related to  self management and patient's adherence to plan as established by provider     SDOH Barriers (Status: Goal on track: YES.)  Patient interviewed and SDOH assessment performed        SDOH Interventions      Flowsheet Row Most Recent Value  SDOH Interventions    Food Insecurity Interventions Intervention Not Indicated  Financial Strain Interventions Intervention Not Indicated  Housing Interventions Intervention Not Indicated  Intimate Partner Violence Interventions Intervention Not Indicated  Physical Activity Interventions Other (Comments)  [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 No structured activity noted.]  Stress Interventions Intervention Not Indicated  Social Connections Interventions Other (Comment)  [Good support system]  Transportation Interventions Intervention Not Indicated       Patient interviewed and appropriate assessments performed Provided patient with information about resources available for help with expressed needs in [place] and the availability of Care guides as  needed  Advised patient to call the office for changes in [place], Questions, or concerns       Diabetes:  (Status: Goal on track: YES.)      Lab Results  Component Value Date    HGBA1C 6.6 (A) 09/13/2020  Assessed patient's understanding of A1c goal: <7% Provided education to patient about basic DM disease process; Reviewed medications with patient and discussed importance of medication adherence. 01-12-2021: The patient states he has the [place] and it is working well for him. He states he is still taking 20 units daily. The patient has not decreased the [place] any. Has parameters to decrease Tresibia 2 units every 7 days for blood sugars <95. The patient states he has not had any lows and he is going to keep the dose at 20 units right now. Continues to work with the pharm D for ongoing support and management of medications.  03-23-2021: The patient has his medications and is taking as prescribed        Reviewed prescribed diet with patient heart  healthy/ADA diet. 03-23-2021: Is compliant with heart healthy diet; Counseled on importance of regular laboratory monitoring as prescribed;        Discussed plans with  patient for ongoing care management follow up and provided patient with direct contact information for care management team;      Provided patient with written educational materials related to hypo and hyperglycemia and importance of correct treatment. 03-23-2021: The patient denies any lows at this time. Review of sx and sx of hypoglycemia and hyperglycemia. Will continue to monitor for changes        Reviewed scheduled/upcoming provider appointments including: 03-30-2021 at 340 pm knows to call the office for changes ;         Advised patient, providing education and rationale, to check cbg BID and record. 03-23-2021: The patient states that his blood sugars are 96 to 98 range and staying this consistently        call provider for findings outside established parameters. 01-12-2021: States that his blood sugar range has been 118 to 120; 03-23-2021: Range has been 96 to 98 consistently      Referral made to pharmacy team for assistance with ongoing support and education related to medications for management of chronic conditions;       Review of patient status, including review of consultants reports, relevant laboratory and other test results, and medications completed;       Screening for signs and symptoms of depression related to chronic disease state;        Assessed social determinant of health barriers;          Hyperlipidemia:  (Status: Goal on track: YES.)      Lab Results  Component Value Date    CHOL 125 09/20/2020    HDL 34 (L) 09/20/2020    LDLCALC 70 09/20/2020    TRIG 119 09/20/2020    CHOLHDL 3.7 09/20/2020      Medication review performed; medication list updated in electronic medical record.  Provider established cholesterol goals reviewed; Counseled on importance of regular laboratory monitoring as prescribed. 03-23-2021: The patient has regular lab work done. Denies any acute findings  Provided HLD educational materials; Reviewed role and benefits of statin for ASCVD risk  reduction; Discussed strategies to manage statin-induced myalgias; Reviewed importance of limiting foods high in cholesterol;   Hypertension: (Status: Goal on track: YES.) Last practice recorded BP readings:     BP Readings from Last 3 Encounters:  10/17/20 (!) 120/40  09/13/20 (!) 121/49  07/12/20 129/60  Most recent eGFR/CrCl: No results found for: EGFR  No components found for: CRCL   Evaluation of current treatment plan related to hypertension self management and patient's adherence to plan as established by provider. 03-23-2021: The patient denies any issues with HTN or heart health. Feels he is overall doing well;   Provided education to patient re: stroke prevention, s/s of heart attack and stroke; Reviewed prescribed diet Heart healthy/ADA diet Heart Healthy/ADA Reviewed medications with patient and discussed importance of compliance. 03-23-2021: The patient states he is compliant with medications  Counseled on adverse effects of illicit drug and excessive alcohol use in patients with high blood pressure;  Discussed plans with patient for ongoing care management follow up and provided patient with direct contact information for care management team; Advised patient, providing education and rationale, to monitor blood pressure daily and record, calling PCP for findings outside established parameters;  Provided education on prescribed diet Heart Healthy/ADA diet ;  Discussed complications of poorly  controlled blood pressure such as heart disease, stroke, circulatory complications, vision complications, kidney impairment, sexual dysfunction;    Smoking Cessation: (Status: Goal on track: NO.) Reviewed smoking history:  tobacco abuse of 40 years; currently smoking 1 ppd Previous quit attempts, unsuccessful 0 successful using 0  Reports smoking within 30 minutes of waking up Reports triggers to smoke include: stress relief Reports motivation to quit smoking includes: knows that  quitting would be better on his health On a scale of 1-10, reports MOTIVATION to quit is 1 On a scale of 1-10, reports CONFIDENCE in quitting is 1   Evaluation of current treatment plan reviewed. 01-12-2021: The patient states that he smokes 10 to 15 cigarettes daily. He is not interested in smoking cessation at this time. Knows there are available resources when he is ready to quit.  Advised patient to discuss smoking cessation options with provider; Provided contact information for [place] Quit Line (1-800-QUIT-NOW); Provided patient with printed smoking cessation educational materials; Discussed plans with patient for ongoing care management follow up and provided patient with direct contact information for care management team;     Pain:  (Status: Goal on Track (progressing): YES.) Long Term Goal  Pain assessment performed. 03-23-2021: The patient denies pain right now but states once he has settled down from work and he has taken his bath and eaten that his left arm starts to hurt and he is only getting about 3 hours a night of sleep. He states that once he gets up and is moving around it is fine. He has tried icy hot and a heating pad and it helps minimally. He states his mother had issues with arthritis and he likely does. He said when he was younger he pulled a lot of tobacco. He was taking OTC pain meds but stopped when he felt like that was not helping either. Is open for recommendations from the provider.  Medications reviewed. 03-23-2021: Is taking OTC pain medications with minimal relief Reviewed provider established plan for pain management; Discussed importance of adherence to all scheduled medical appointments. 03-23-2021: Assisted patient with securing an appointment for 03-30-2021 at 340 pm with pcp. Reviewed with the patient and the patient verbalized understanding of appointment Counseled on the importance of reporting any/all new or changed pain symptoms or management strategies to pain  management provider; Advised patient to report to care team affect of pain on daily activities; Discussed use of relaxation techniques and/or diversional activities to assist with pain reduction (distraction, imagery, relaxation, massage, acupressure, TENS, heat, and cold application; Reviewed with patient prescribed pharmacological and nonpharmacological pain relief strategies; Advised patient to discuss unresolved pain, changes in level or intensity of pain with provider; Screening for signs and symptoms of depression related to chronic disease state;  Assessed social determinant of health barriers;     Patient Goals/Self-Care Activities: Patient will self administer medications as prescribed as evidenced by self report/primary caregiver report  Patient will attend all scheduled provider appointments as evidenced by clinician review of documented attendance to scheduled appointments and patient/caregiver report Patient will call pharmacy for medication refills as evidenced by patient report and review of pharmacy fill history as appropriate Patient will attend church or other social activities as evidenced by patient report Patient will continue to perform ADL's independently as evidenced by patient/caregiver report Patient will continue to perform IADL's independently as evidenced by patient/caregiver report Patient will call provider office for new concerns or questions as evidenced by review of documented incoming telephone call notes and  patient report Patient will work with BSW to address care coordination needs and will continue to work with the clinical team to address health care and disease management related needs as evidenced by documented adherence to scheduled care management/care coordination appointments - keep appointment with eye doctor - schedule appointment with eye doctor - check blood sugar at prescribed times: before meals and at bedtime, when you have symptoms of low or  high blood sugar, and before and after exercise - check feet daily for cuts, sores or redness - enter blood sugar readings and medication or insulin into daily log - take the blood sugar log to all doctor visits - trim toenails straight across - drink 6 to 8 glasses of water each day - eat fish at least once per week - fill half of plate with vegetables - limit fast food meals to no more than 1 per week - manage portion size - prepare main meal at home 3 to 5 days each week - read food labels for fat, fiber, carbohydrates and portion size - reduce red meat to 2 to 3 times a week - keep feet up while sitting - wash and dry feet carefully every day - wear comfortable, cotton socks - wear comfortable, well-fitting shoes - check blood pressure weekly - choose a place to take my blood pressure (home, clinic or office, retail store) - learn about high blood pressure - keep a blood pressure log - call doctor for signs and symptoms of high blood pressure - develop an action plan for high blood pressure - keep all doctor appointments - take medications for blood pressure exactly as prescribed - call for medicine refill 2 or 3 days before it runs out - take all medications exactly as prescribed - call doctor with any symptoms you believe are related to your medicine - call doctor when you experience any new symptoms - go to all doctor appointments as scheduled - adhere to prescribed diet: Heart Healthy/ADA     Our next appointment is by telephone on 05-11-2021 at 345 pm   Please call the care guide team at [phone] if you need to cancel or reschedule your appointment.   If you are experiencing a Mental Health or [place] or need someone to talk to, please call the Suicide and Crisis Lifeline: 988 call the [place] National Suicide Prevention Lifeline: 209 846 2336 or TTY: 7803110264 TTY 684-302-5178) to talk to a trained counselor call 1-800-273-TALK (toll free, 24  hour hotline)   The patient verbalized understanding of instructions, educational materials, and care plan provided today and declined offer to receive copy of patient instructions, educational materials, and care plan.   Telephone follow up appointment with care management team member scheduled for: 05-11-2021 at 345 pm  Noreene Larsson RN, MSN, [place] Mobile: [phone]

## 2021-03-29 ENCOUNTER — Ambulatory Visit: Payer: BC Managed Care – PPO | Admitting: Pharmacist

## 2021-03-29 DIAGNOSIS — I10 Essential (primary) hypertension: Secondary | ICD-10-CM

## 2021-03-29 DIAGNOSIS — Z720 Tobacco use: Secondary | ICD-10-CM

## 2021-03-29 DIAGNOSIS — E1165 Type 2 diabetes mellitus with hyperglycemia: Secondary | ICD-10-CM

## 2021-03-29 DIAGNOSIS — E782 Mixed hyperlipidemia: Secondary | ICD-10-CM

## 2021-03-29 DIAGNOSIS — Z794 Long term (current) use of insulin: Secondary | ICD-10-CM

## 2021-03-29 NOTE — Patient Instructions (Signed)
Thank you allowing the Care Management Team to be a part of your care! It was a pleasure speaking with you today!     Care Management Team    Noreene Larsson RN, MSN, CCM Nurse Care Coordinator  [phone]   Wallace Cullens, PharmD  Clinical Pharmacist  [phone]   Christa See, MSW, LCSW Clinical Social Worker [phone]   Visit Information   Goals Addressed             This Visit's Progress    Pharmacy Goals       Our goal A1c is less than 7%. This corresponds with fasting sugars less than 130 and 2 hour after meal sugars less than 180. Please check your fasting blood sugar and keep a log of the results  Our goal bad cholesterol, or LDL, is less than 70 . This is why it is important to continue taking your atorvastatin  Our goal blood pressure is less than 130/80. Please check your blood pressure and keep log of the results.  Feel free to call me with any questions or concerns. I look forward to our next call!   Wallace Cullens, PharmD, [place] [phone]        The patient verbalized understanding of instructions, educational materials, and care plan provided today and declined offer to receive copy of patient instructions, educational materials, and care plan.   Telephone follow up appointment with care management team member scheduled for: 04/26/2021 at 4:00 PM

## 2021-03-29 NOTE — Chronic Care Management (AMB) (Signed)
Care Management   Pharmacy Note  03/29/2021 Name: Anthony Bell MRN: [id number] DOB: Apr 18, 1955  Subjective: Anthony Bell is a 66 y.o. year old male who is a primary care patient of [name], NP. The Care Management team was consulted for assistance with care management and care coordination needs.    Engaged with patient by telephone for follow up visit in response to provider referral for pharmacy case management and/or care coordination services.   The patient was given information about Care Management services today including:  Care Management services includes personalized support from designated clinical staff supervised by the patient's primary care provider, including individualized plan of care and coordination with other care providers. 24/7 contact phone numbers for assistance for urgent and routine care needs. The patient may stop case management services at any time by phone call to the office staff.  Patient agreed to services and consent obtained.  Assessment:  Review of patient status, including review of consultants reports, laboratory and other test data, was performed as part of comprehensive evaluation and provision of chronic care management services.   SDOH (Social Determinants of Health) assessments and interventions performed:    Objective:  Lab Results  Component Value Date   CREATININE 1.26 (H) 09/20/2020   CREATININE 1.21 06/02/2020   CREATININE 1.47 (H) 03/22/2020    Lab Results  Component Value Date   HGBA1C 6.6 (A) 09/13/2020       Component Value Date/Time   CHOL 125 09/20/2020 0750   TRIG 119 09/20/2020 0750   HDL 34 (L) 09/20/2020 0750   CHOLHDL 3.7 09/20/2020 0750   LDLCALC 70 09/20/2020 0750    BP Readings from Last 3 Encounters:  10/17/20 (!) 120/40  09/13/20 (!) 121/49  07/12/20 129/60    Care Plan  No Known Allergies  Medications Reviewed Today     Reviewed by Vanita Ingles, RN (Case Manager) on 03/23/21 at [place] List Status: <None>   Medication Order Taking? Sig Documenting Provider Last Dose Status Informant  amLODipine (NORVASC) 5 MG tablet [id number] No Take 1 tablet (5 mg total) by mouth daily. [name], NP Taking Active   aspirin EC 81 MG tablet [id number] No Take 81 mg by mouth.  [provider] Taking Active Self  atorvastatin (LIPITOR) 20 MG tablet [id number] No Take 1 tablet (20 mg total) by mouth daily. [name], FNP Taking Active   blood glucose meter kit and supplies KIT [id number] No Dispense based on patient and insurance preference. Use up to four times daily as directed. 100 strips and lancets please [name], NP Taking Active   Blood Glucose Monitoring Suppl (CONTOUR NEXT EZ) w/Device KIT [id number] No USE UP TO FOUR TIMES DAILY AS DIRECTED. (FOR ICD-10 E10.9, E11.9). [name], DO Taking Active   CONTOUR NEXT TEST test strip [id number] No USE AS DIRECTED UPTO 4 TIMES A [name], NP Taking Active   empagliflozin (JARDIANCE) 10 MG TABS tablet [id number] No Take 1 tablet (10 mg total) by mouth daily before breakfast. [name], NP Taking Active   hydrochlorothiazide (HYDRODIURIL) 25 MG tablet [id number]  TAKE 1 TABLET (25 MG TOTAL) BY MOUTH DAILY. [name], NP  Active   insulin degludec (TRESIBA) 100 UNIT/ML FlexTouch Pen [id number]  Inject 20 Units into the skin daily. [name], NP  Active   Insulin Pen Needle 32G X 4 MM MISC [id number] No 1 Units by Does not apply route every morning.  Pen needles [name], NP Taking Active   lisinopril (ZESTRIL) 20 MG tablet [id number] No Take 1 tablet (20 mg total) by mouth daily. [name], NP Taking Active   metFORMIN (GLUCOPHAGE) 1000 MG tablet [id number] No Take 1 tablet (1,000 mg total) by mouth 2 (two) times daily with a meal. [name], NP Taking Active   tadalafil (CIALIS) 5 MG tablet [id number] No Take 1 tablet (5 mg total) by mouth daily as needed for erectile  dysfunction. [name], FNP Taking Active             Patient Active Problem List   Diagnosis Date Noted   Overweight (BMI 25.0-29.9) 09/13/2020   COPD (chronic obstructive pulmonary disease) ([place]) 06/14/2020   Hyperlipidemia 03/23/2020   Erectile dysfunction 03/22/2020   Type 2 diabetes mellitus ([place]) 08/23/2017   Gastroesophageal reflux disease 12/07/2014   Essential hypertension 12/07/2014   History of rectal cancer 12/07/2014    Care Plan : PharmD Medication Management  Updates made by [name], RPH-CPP since 03/29/2021 12:00 AM     Problem: Disease Progression      Long-Range Goal: Disease Progression Prevented or Minimized   Start Date: 06/06/2020  Expected End Date: 09/04/2020  This Visit's Progress: On track  Recent Progress: On track  Priority: High  Note:   Current Barriers:  Financial barriers Using copayment card from manufacturers for [place] Lack of blood pressure results for clinical team  Pharmacist Clinical Goal(s):  Over the next 90 days, patient will maintain control of blood sugar as evidenced by A1C <7% through collaboration with PharmD and provider.   Interventions: 1:1 collaboration with Webb Silversmith NP regarding development and update of comprehensive plan of care as evidenced by provider attestation and co-signature Inter-disciplinary care team collaboration (see longitudinal plan of care)  T2DM: Current treatment: Metformin 1000 mg twice daily with meals Jardiance 10 mg daily before breakfast Tresiba 20 units daily Reports recent morning fasting CBGs ranging 97-125 Denies symptoms of hypoglycemia Note patient carries soft peppermint candies with him to use as rapid source of sugar if needed for lows Exercise: reports currently walking ~15 minutes x most days/week Patient agreeable to goal of walking 30 minutes x most days/week  Hypertension: Current treatment: HCTZ 25 mg daily lisinopril 20 mg  daily amlodipine 5 mg daily Will have blood pressure checked tomorrow at appointment with PCP Denies symptoms of hypotension or hypertension Encourage patient to continue to have BP checked, keep a log of the results (including heart rate) and have this record for [place] to review during our calls  Hyperlipidemia: Controlled; current treatment: Atorvastatin 20 mg daily   Medication Adherence: Note his wife helps to manage his medications including his Lantus nightly Patient using weekly pillboxes as adherence aid. Have advised patient to also use medication list from latest office visit when refilling pillbox to confirm has all medications.   Tobacco Abuse: Reports has reduced smoking to ~10 cigarettes/day Discussed benefits of smoking cessation Motivation: health benefit Reports has been thinking about quitting, but would want to do this with his wife Recently quit together for a week, but restarted Not ready to set quit date today, but will discuss again during next call  Patient Goals/Self-Care Activities Over the next 90 days, patient will:  - check glucose, document, and provide at future appointments - check blood pressure, document, and provide at future appointments - attend medical appointments as scheduled  Follow Up Plan: Telephone follow up appointment with care  management team member scheduled for: 04/26/2021 at 4:00 PM       Wallace Cullens, PharmD, [place], [place] [phone]

## 2021-03-30 ENCOUNTER — Ambulatory Visit: Payer: BLUE CROSS/BLUE SHIELD | Admitting: Internal Medicine

## 2021-03-30 ENCOUNTER — Other Ambulatory Visit: Payer: Self-pay

## 2021-03-30 VITALS — BP 131/58 | HR 74 | Temp 97.1°F | Resp 18 | Ht 68.0 in | Wt 193.6 lb

## 2021-03-30 DIAGNOSIS — Z6829 Body mass index (BMI) 29.0-29.9, adult: Secondary | ICD-10-CM

## 2021-03-30 DIAGNOSIS — E663 Overweight: Secondary | ICD-10-CM

## 2021-03-30 DIAGNOSIS — Z1159 Encounter for screening for other viral diseases: Secondary | ICD-10-CM | POA: Diagnosis not present

## 2021-03-30 DIAGNOSIS — M7522 Bicipital tendinitis, left shoulder: Secondary | ICD-10-CM

## 2021-03-30 DIAGNOSIS — Z0001 Encounter for general adult medical examination with abnormal findings: Secondary | ICD-10-CM | POA: Diagnosis not present

## 2021-03-30 DIAGNOSIS — E1165 Type 2 diabetes mellitus with hyperglycemia: Secondary | ICD-10-CM

## 2021-03-30 DIAGNOSIS — Z114 Encounter for screening for human immunodeficiency virus [HIV]: Secondary | ICD-10-CM

## 2021-03-30 NOTE — Progress Notes (Signed)
Subjective:    Patient ID: Anthony Bell, male    DOB: Jan 18, 1956, 65 y.o.   MRN: [id number]  HPI  Pt presents to the clinic today for his annual exam.  Flu: Never Tetanus: 03/2016 Pneumovax: 07/2017 COVID: [place] x2 Shingrix: Never PSA screening: 06/2019 Colon screening: 03/2020 Vision screening: As needed Dentist: As needed  Diet: He does eat meat.  He consumes fruits and vegetables.  He does eat fried foods.  He drinks mostly water, some soda Exercise: None   Review of Systems  Past Medical History:  Diagnosis Date   Colon polyps    Diabetes mellitus without complication (HCC)    GERD (gastroesophageal reflux disease)    Hypertension    Rectal cancer (HCC)    [facility]    Current Outpatient Medications  Medication Sig Dispense Refill   amLODipine (NORVASC) 5 MG tablet Take 1 tablet (5 mg total) by mouth daily. 90 tablet 1   aspirin EC 81 MG tablet Take 81 mg by mouth.      atorvastatin (LIPITOR) 20 MG tablet Take 1 tablet (20 mg total) by mouth daily. 90 tablet 3   blood glucose meter kit and supplies KIT Dispense based on patient and insurance preference. Use up to four times daily as directed. 100 strips and lancets please 1 each 0   Blood Glucose Monitoring Suppl (CONTOUR NEXT EZ) w/Device KIT USE UP TO FOUR TIMES DAILY AS DIRECTED. (FOR ICD-10 E10.9, E11.9). 1 kit 0   CONTOUR NEXT TEST test strip USE AS DIRECTED UPTO 4 TIMES A DAY 100 strip 5   empagliflozin (JARDIANCE) 10 MG TABS tablet Take 1 tablet (10 mg total) by mouth daily before breakfast. 90 tablet 1   hydrochlorothiazide (HYDRODIURIL) 25 MG tablet TAKE 1 TABLET (25 MG TOTAL) BY MOUTH DAILY. 90 tablet 0   insulin degludec (TRESIBA) 100 UNIT/ML FlexTouch Pen Inject 20 Units into the skin daily. 15 mL 1   Insulin Pen Needle 32G X 4 MM MISC 1 Units by Does not apply route every morning. Pen needles 90 each 3   lisinopril (ZESTRIL) 20 MG tablet Take 1 tablet (20 mg total) by mouth daily. 90 tablet 1   metFORMIN  (GLUCOPHAGE) 1000 MG tablet Take 1 tablet (1,000 mg total) by mouth 2 (two) times daily with a meal. 180 tablet 3   tadalafil (CIALIS) 5 MG tablet Take 1 tablet (5 mg total) by mouth daily as needed for erectile dysfunction. 10 tablet 0   No current facility-administered medications for this visit.    No Known Allergies  Family History  Problem Relation Age of Onset   Diabetes Mother    Cancer Father    Diabetes Father    Diabetes Sister    Heart attack Neg Hx    Stroke Neg Hx     Social History   Socioeconomic History   Marital status: Married    Spouse name: Not on file   Number of children: Not on file   Years of education: Not on file   Highest education level: Not on file  Occupational History   Not on file  Tobacco Use   Smoking status: Every Day    Packs/day: 1.00    Years: 40.00    Pack years: 40.00    Types: Cigarettes   Smokeless tobacco: Never  Vaping Use   Vaping Use: Never used  Substance and Sexual Activity   Alcohol use: No   Drug use: No   Sexual activity: Not on  file  Other Topics Concern   Not on file  Social History Narrative   Not on file   Social Determinants of Health   Financial Resource Strain: Low Risk    Difficulty of Paying Living Expenses: Not hard at all  Food Insecurity: No Food Insecurity   Worried About Charity fundraiser in the Last Year: Never true   [place] in the Last Year: Never true  Transportation Needs: No Transportation Needs   Lack of Transportation (Medical): No   Lack of Transportation (Non-Medical): No  Physical Activity: Inactive   Days of Exercise per Week: 0 days   Minutes of Exercise per Session: 0 min  Stress: No Stress Concern Present   Feeling of Stress : Not at all  Social Connections: Moderately Isolated   Frequency of Communication with Friends and Family: More than three times a week   Frequency of Social Gatherings with Friends and Family: More than three times a week   Attends Religious  Services: Never   Marine scientist or Organizations: No   Attends Music therapist: Never   Marital Status: Married  Human resources officer Violence: Not At Risk   Fear of Current or Ex-Partner: No   Emotionally Abused: No   Physically Abused: No   Sexually Abused: No     Constitutional: Denies fever, malaise, fatigue, headache or abrupt weight changes.  HEENT: Denies eye pain, eye redness, ear pain, ringing in the ears, wax buildup, runny nose, nasal congestion, bloody nose, or sore throat. Respiratory: Denies difficulty breathing, shortness of breath, cough or sputum production.   Cardiovascular: Denies chest pain, chest tightness, palpitations or swelling in the hands or feet.  Gastrointestinal: Denies abdominal pain, bloating, constipation, diarrhea or blood in the stool.  GU: Denies urgency, frequency, pain with urination, burning sensation, blood in urine, odor or discharge. Musculoskeletal: Pt reports left upper arm pain. Denies decrease in range of motion, difficulty with gait, or joint pain and swelling.  Skin: Denies redness, rashes, lesions or ulcercations.  Neurological: Denies dizziness, difficulty with memory, difficulty with speech or problems with balance and coordination.  Psych: Denies anxiety, depression, SI/HI.  No other specific complaints in a complete review of systems (except as listed in HPI above).     Objective:   Physical Exam  BP (!) 131/58 (BP Location: Right Arm, Patient Position: Sitting, Cuff Size: Normal)    Pulse 74    Temp (!) 97.1 F (36.2 C) (Temporal)    Resp 18    Ht 5' 8"  (1.727 m)    Wt 193 lb 9.6 oz (87.8 kg)    SpO2 100%    BMI 29.44 kg/m   Wt Readings from Last 3 Encounters:  09/13/20 194 lb (88 kg)  07/12/20 187 lb 3.2 oz (84.9 kg)  06/14/20 186 lb 6.4 oz (84.6 kg)    General: Appears his stated age, obese, in NAD. Skin: Warm, dry and intact. No ulcerations noted. HEENT: Head: normal shape and size; Eyes: sclera white  and EOMs intact;  Neck:  Neck supple, trachea midline. No masses, lumps or thyromegaly present.  Cardiovascular: Normal rate and rhythm. S1,S2 noted.  No murmur, rubs or gallops noted. No JVD or BLE edema. No carotid bruits noted. Pulmonary/Chest: Normal effort and positive vesicular breath sounds. No respiratory distress. No wheezes, rales or ronchi noted.  Abdomen: Soft and nontender. Normal bowel sounds. No distention or masses noted. Liver, spleen and kidneys non palpable. Musculoskeletal: Normal internal  and external rotation of the left shoulder.  Pain with palpation of the anterior biceps tendon.  Strength 5/5 BUE/BLE.  No difficulty with gait.  Neurological: Alert and oriented. Cranial nerves II-XII grossly intact. Coordination normal.  Psychiatric: Mood and affect normal. Behavior is normal. Judgment and thought content normal.    BMET    Component Value Date/Time   NA 141 09/20/2020 0750   NA 144 10/10/2011 1615   K 3.5 09/20/2020 0750   K 4.3 10/10/2011 1615   CL 103 09/20/2020 0750   CL 104 10/10/2011 1615   CO2 31 09/20/2020 0750   CO2 35 (H) 10/10/2011 1615   GLUCOSE 78 09/20/2020 0750   GLUCOSE 106 (H) 10/10/2011 1615   BUN 17 09/20/2020 0750   BUN 17 10/10/2011 1615   CREATININE 1.26 (H) 09/20/2020 0750   CALCIUM 9.6 09/20/2020 0750   CALCIUM 9.2 10/10/2011 1615   GFRNONAA 60 09/20/2020 0750   GFRAA 70 09/20/2020 0750    Lipid Panel     Component Value Date/Time   CHOL 125 09/20/2020 0750   TRIG 119 09/20/2020 0750   HDL 34 (L) 09/20/2020 0750   CHOLHDL 3.7 09/20/2020 0750   LDLCALC 70 09/20/2020 0750    CBC    Component Value Date/Time   WBC 8.8 09/20/2020 0750   RBC 6.24 (H) 09/20/2020 0750   HGB 16.9 09/20/2020 0750   HGB 14.9 10/10/2011 1615   HCT 52.9 (H) 09/20/2020 0750   HCT 46.9 10/10/2011 1615   PLT 187 09/20/2020 0750   PLT 159 10/10/2011 1615   MCV 84.8 09/20/2020 0750   MCV 87 10/10/2011 1615   MCH 27.1 09/20/2020 0750   MCHC 31.9  (L) 09/20/2020 0750   RDW 14.5 09/20/2020 0750   RDW 14.3 10/10/2011 1615   LYMPHSABS 1,448 06/02/2020 0958   EOSABS 193 06/02/2020 0958   BASOSABS 31 06/02/2020 0958    Hgb A1C Lab Results  Component Value Date   HGBA1C 6.6 (A) 09/13/2020           Assessment & Plan:   Preventative Health Maintenance:  He declines flu shot today Tetanus UTD Pneumovax UTD Encouraged him to get his covid booster He declines shingrix vaccine at this time Colon screening UTD Encouraged him to consume a balanced diet and exercise regimen Advised him to see an eye doctor and dentist annually Will check CBC, CMT, Lipid, PSA, A1C, urine microalbumin, HIV and Hep C today  Left Biceps Tendonitis:  Will send in RX for Pred Taper x 9 days- monitor sugars Avoid repetitive motions Ice for 10 minutes 2 x day  RTC in 6 months follow up chronic conditions  Webb Silversmith, NP This visit occurred during the SARS-CoV-2 public health emergency.  Safety protocols were in place, including screening questions prior to the visit, additional usage of staff PPE, and extensive cleaning of exam room while observing appropriate contact time as indicated for disinfecting solutions.

## 2021-03-31 ENCOUNTER — Encounter: Payer: Self-pay | Admitting: Internal Medicine

## 2021-03-31 MED ORDER — PREDNISONE 10 MG PO TABS: ORAL_TABLET | ORAL | 0 refills | Status: DC

## 2021-03-31 NOTE — Assessment & Plan Note (Signed)
Encouraged diet and exercise for weight loss ?

## 2021-03-31 NOTE — Patient Instructions (Signed)

## 2021-04-06 LAB — COMPLETE METABOLIC PANEL WITH GFR
AG Ratio: 1.6 (calc) (ref 1.0–2.5)
ALT: 10 U/L (ref 9–46)
AST: 12 U/L (ref 10–35)
Albumin: 4.1 g/dL (ref 3.6–5.1)
Alkaline phosphatase (APISO): 94 U/L (ref 35–144)
BUN: 21 mg/dL (ref 7–25)
CO2: 33 mmol/L — ABNORMAL HIGH (ref 20–32)
Calcium: 9.5 mg/dL (ref 8.6–10.3)
Chloride: 101 mmol/L (ref 98–110)
Creat: 1.32 mg/dL (ref 0.70–1.35)
Globulin: 2.6 g/dL (calc) (ref 1.9–3.7)
Glucose, Bld: 121 mg/dL (ref 65–139)
Potassium: 4.1 mmol/L (ref 3.5–5.3)
Sodium: 141 mmol/L (ref 135–146)
Total Bilirubin: 0.5 mg/dL (ref 0.2–1.2)
Total Protein: 6.7 g/dL (ref 6.1–8.1)
eGFR: 61 mL/min/{1.73_m2} (ref >=60)

## 2021-04-06 LAB — CBC
HCT: 48.4 % (ref 38.5–50.0)
Hemoglobin: 16.4 g/dL (ref 13.2–17.1)
MCH: 27.4 pg (ref 27.0–33.0)
MCHC: 33.9 g/dL (ref 32.0–36.0)
MCV: 80.9 fL (ref 80.0–100.0)
MPV: 10.4 fL (ref 7.5–12.5)
Platelets: 209 10*3/uL (ref 140–400)
RBC: 5.98 10*6/uL — ABNORMAL HIGH (ref 4.20–5.80)
RDW: 14.1 % (ref 11.0–15.0)
WBC: 8.9 10*3/uL (ref 3.8–10.8)

## 2021-04-06 LAB — HEMOGLOBIN A1C
Hgb A1c MFr Bld: 6.8 % of total Hgb — ABNORMAL HIGH (ref <5.7)
Mean Plasma Glucose: 148 mg/dL
eAG (mmol/L): 8.2 mmol/L

## 2021-04-06 LAB — CARDIO IQ(R) ADVANCED LIPID PANEL
Apolipoprotein B: 76 mg/dL (ref <90)
Cholesterol: 127 mg/dL (ref <200)
HDL: 36 mg/dL — ABNORMAL LOW (ref >39)
LDL Cholesterol (Calc): 70 mg/dL (calc) (ref <100)
LDL Large: 6794 nmol/L (ref >6729)
LDL Medium: 239 nmol/L — ABNORMAL HIGH (ref <215)
LDL Particle Number: 1211 nmol/L — ABNORMAL HIGH (ref <1138)
LDL Peak Size: 214.4 Angstrom — ABNORMAL LOW (ref >222.9)
LDL Small: 248 nmol/L — ABNORMAL HIGH (ref <142)
Lipoprotein (a): 25 nmol/L (ref <75)
Non-HDL Cholesterol (Calc): 91 mg/dL (calc) (ref <130)
Total CHOL/HDL Ratio: 3.5 calc (ref <3.6)
Triglycerides: 130 mg/dL (ref <150)

## 2021-04-06 LAB — MICROALBUMIN / CREATININE URINE RATIO
Creatinine, Urine: 79 mg/dL (ref 20–320)
Microalb Creat Ratio: 3 mcg/mg creat (ref <30)
Microalb, Ur: 0.2 mg/dL

## 2021-04-06 LAB — LIPID PANEL
Cholesterol: 123 mg/dL (ref <200)
HDL: 33 mg/dL — ABNORMAL LOW (ref >=40)
LDL Cholesterol (Calc): 68 mg/dL (calc)
Non-HDL Cholesterol (Calc): 90 mg/dL (calc) (ref <130)
Total CHOL/HDL Ratio: 3.7 (calc) (ref <5.0)
Triglycerides: 135 mg/dL (ref <150)

## 2021-04-06 LAB — HIV ANTIBODY (ROUTINE TESTING W REFLEX): HIV 1&2 Ab, 4th Generation: NONREACTIVE

## 2021-04-06 LAB — TEST AUTHORIZATION

## 2021-04-06 LAB — HEPATITIS C ANTIBODY
Hepatitis C Ab: NONREACTIVE
SIGNAL TO CUT-OFF: 0.03 (ref <1.00)

## 2021-04-10 ENCOUNTER — Other Ambulatory Visit: Payer: Self-pay | Admitting: Internal Medicine

## 2021-04-10 DIAGNOSIS — I10 Essential (primary) hypertension: Secondary | ICD-10-CM

## 2021-04-10 NOTE — Telephone Encounter (Signed)
Future OV 09/28/21. Approved for 90 day supply per protocol.  Requested Prescriptions  Pending Prescriptions Disp Refills   amLODipine (NORVASC) 5 MG tablet [Pharmacy Med Name: AMLODIPINE BESYLATE 5 MG TAB] 90 tablet 0    Sig: TAKE 1 TABLET (5 MG TOTAL) BY MOUTH DAILY.     Cardiovascular:  Calcium Channel Blockers Passed - 04/10/2021  1:31 AM      Passed - Last BP in normal range    BP Readings from Last 1 Encounters:  03/30/21 (!) 131/58         Passed - Valid encounter within last 6 months    Recent Outpatient Visits          1 week ago Encounter for general adult medical examination with abnormal findings   [facility] [place], [name], NP   6 months ago Type 2 diabetes mellitus with hyperglycemia, without long-term current use of insulin [facility])   [facility] [place], [name], NP   9 months ago Depression, unspecified depression type   [facility] [name], NP   10 months ago Type 2 diabetes mellitus with hyperglycemia, without long-term current use of insulin [facility])   [facility] [name], NP   10 months ago Type 2 diabetes mellitus with hyperglycemia, without long-term current use of insulin [facility])   [facility] [name], NP      Future Appointments            In 5 months [place], [name], NP [facility], [facility]

## 2021-04-26 ENCOUNTER — Telehealth: Payer: BLUE CROSS/BLUE SHIELD

## 2021-05-03 ENCOUNTER — Telehealth: Payer: Self-pay | Admitting: Pharmacist

## 2021-05-03 ENCOUNTER — Ambulatory Visit: Payer: Self-pay | Admitting: Pharmacist

## 2021-05-03 DIAGNOSIS — E1165 Type 2 diabetes mellitus with hyperglycemia: Secondary | ICD-10-CM

## 2021-05-03 DIAGNOSIS — I10 Essential (primary) hypertension: Secondary | ICD-10-CM

## 2021-05-03 DIAGNOSIS — Z794 Long term (current) use of insulin: Secondary | ICD-10-CM

## 2021-05-03 DIAGNOSIS — Z720 Tobacco use: Secondary | ICD-10-CM

## 2021-05-03 DIAGNOSIS — E785 Hyperlipidemia, unspecified: Secondary | ICD-10-CM

## 2021-05-03 DIAGNOSIS — E782 Mixed hyperlipidemia: Secondary | ICD-10-CM

## 2021-05-03 NOTE — Telephone Encounter (Signed)
Patient requesting renewals of the following Rx due to current Rxs out of refills/expired: -HCTZ -lisinopril -Jardiance -atorvastatin  Would you please send renewals to patient's [facility]?  Thank you!  Wallace Cullens, PharmD, [place] [phone]

## 2021-05-04 MED ORDER — LISINOPRIL 20 MG PO TABS: 20.0000 mg | ORAL_TABLET | Freq: Every day | ORAL | 1 refills | Status: DC

## 2021-05-04 MED ORDER — EMPAGLIFLOZIN 10 MG PO TABS: 10.0000 mg | ORAL_TABLET | Freq: Every day | ORAL | 1 refills | Status: DC

## 2021-05-04 MED ORDER — ATORVASTATIN CALCIUM 20 MG PO TABS: 20.0000 mg | ORAL_TABLET | Freq: Every day | ORAL | 1 refills | Status: DC

## 2021-05-04 MED ORDER — HYDROCHLOROTHIAZIDE 25 MG PO TABS: 25.0000 mg | ORAL_TABLET | Freq: Every day | ORAL | 1 refills | Status: DC

## 2021-05-04 NOTE — Chronic Care Management (AMB) (Signed)
Care Management   Pharmacy Note  05/03/2021 Name: Octavious Zidek MRN: [id number] DOB: May 06, 1955  Subjective: Mackey Varricchio is a 66 y.o. year old male who is a primary care patient of [name], NP. The Care Management team was consulted for assistance with care management and care coordination needs.    Engaged with patient by telephone for follow up visit in response to provider referral for pharmacy case management and/or care coordination services.   The patient was given information about Care Management services today including:  Care Management services includes personalized support from designated clinical staff supervised by the patient's primary care provider, including individualized plan of care and coordination with other care providers. 24/7 contact phone numbers for assistance for urgent and routine care needs. The patient may stop case management services at any time by phone call to the office staff.  Patient agreed to services and consent obtained.  Assessment:  Review of patient status, including review of consultants reports, laboratory and other test data, was performed as part of comprehensive evaluation and provision of chronic care management services.   SDOH (Social Determinants of Health) assessments and interventions performed:    Objective:  Lab Results  Component Value Date   CREATININE 1.32 03/30/2021   CREATININE 1.26 (H) 09/20/2020   CREATININE 1.21 06/02/2020    Lab Results  Component Value Date   HGBA1C 6.8 (H) 03/30/2021       Component Value Date/Time   CHOL 123 03/30/2021 1550   CHOL 127 03/30/2021 1550   TRIG 135 03/30/2021 1550   TRIG 130 03/30/2021 1550   HDL 33 (L) 03/30/2021 1550   HDL 36 (L) 03/30/2021 1550   CHOLHDL 3.7 03/30/2021 1550   CHOLHDL 3.5 03/30/2021 1550   LDLCALC 68 03/30/2021 1550   LDLCALC 70 03/30/2021 1550    BP Readings from Last 3 Encounters:  03/30/21 (!) 131/58  10/17/20 (!) 120/40  09/13/20 (!)  121/49   Pulse Readings from Last 3 Encounters:  03/30/21 74  09/13/20 69  07/12/20 72     Care Plan  No Known Allergies  Medications Reviewed Today     Reviewed by [name], RPH-CPP (Pharmacist) on 05/04/21 at 0835  Med List Status: <None>   Medication Order Taking? Sig Documenting Provider Last Dose Status Informant  amLODipine (NORVASC) 5 MG tablet [id number] Yes TAKE 1 TABLET (5 MG TOTAL) BY MOUTH DAILY. [name], NP Taking Active   aspirin EC 81 MG tablet [id number]  Take 81 mg by mouth.  [provider]  Active Self  atorvastatin (LIPITOR) 20 MG tablet [id number] Yes Take 1 tablet (20 mg total) by mouth daily. [name], FNP Taking Active   blood glucose meter kit and supplies KIT [id number]  Dispense based on patient and insurance preference. Use up to four times daily as directed. 100 strips and lancets please [name], NP  Active   Blood Glucose Monitoring Suppl (CONTOUR NEXT EZ) w/Device KIT [id number]  USE UP TO FOUR TIMES DAILY AS DIRECTED. (FOR ICD-10 E10.9, E11.9). [name], DO  Active   CONTOUR NEXT TEST test strip [id number]  USE AS DIRECTED UPTO 4 TIMES A [name], NP  Active   empagliflozin (JARDIANCE) 10 MG TABS tablet [id number] Yes Take 1 tablet (10 mg total) by mouth daily before breakfast. [name], NP Taking Active   hydrochlorothiazide (HYDRODIURIL) 25 MG tablet [id number] Yes TAKE 1 TABLET (25 MG TOTAL) BY MOUTH DAILY. [name],  NP Taking Active   insulin degludec (TRESIBA) 100 UNIT/ML FlexTouch Pen [id number] Yes Inject 20 Units into the skin daily. [name], NP Taking Active   Insulin Pen Needle 32G X 4 MM MISC [id number]  1 Units by Does not apply route every morning. Pen needles [name], NP  Active   lisinopril (ZESTRIL) 20 MG tablet [id number] Yes Take 1 tablet (20 mg total) by mouth daily. [name], NP Taking Active   metFORMIN (GLUCOPHAGE) 1000 MG tablet [id number] Yes  Take 1 tablet (1,000 mg total) by mouth 2 (two) times daily with a meal. [name], NP Taking Active   tadalafil (CIALIS) 5 MG tablet [id number]  Take 1 tablet (5 mg total) by mouth daily as needed for erectile dysfunction. [name], FNP  Active             Patient Active Problem List   Diagnosis Date Noted   Overweight with body mass index (BMI) of 29 to 29.9 in adult 09/13/2020   COPD (chronic obstructive pulmonary disease) ([place]) 06/14/2020   Hyperlipidemia 03/23/2020   Erectile dysfunction 03/22/2020   Type 2 diabetes mellitus ([place]) 08/23/2017   Gastroesophageal reflux disease 12/07/2014   Essential hypertension 12/07/2014   History of rectal cancer 12/07/2014     Care Plan : PharmD Medication Management  Updates made by [name], RPH-CPP since 05/04/2021 12:00 AM     Problem: Disease Progression      Long-Range Goal: Disease Progression Prevented or Minimized   Start Date: 06/06/2020  Expected End Date: 09/04/2020  This Visit's Progress: On track  Recent Progress: On track  Priority: High  Note:   Current Barriers:  Financial barriers Using copayment card from manufacturers for [place] Lack of blood pressure results for clinical team  Pharmacist Clinical Goal(s):  Over the next 90 days, patient will maintain control of blood sugar as evidenced by A1C <7% through collaboration with PharmD and provider.   Interventions: 1:1 collaboration with Webb Silversmith NP regarding development and update of comprehensive plan of care as evidenced by provider attestation and co-signature Inter-disciplinary care team collaboration (see longitudinal plan of care) Office Visit with PCP on 03/30/2021 for annual exam. Provider advised patient: Sent RX for Pred Taper x 9 days for Left Biceps Tendonitis - monitor sugars Avoid repetitive motions Ice for 10 minutes 2 x day Today patient reports completed course of prednisone and symptoms improved, but then  came back.  Review with patients instructions from PCP including to avoid repetitive motions and icing. Patient states he is considering calling office to schedule follow up visit to PCP regarding bicep pain  T2DM: Controlled; current treatment: Metformin 1000 mg twice daily with meals Jardiance 10 mg daily before breakfast Tresiba 20 units daily Reports recent before supper CBGs ranging 101-110 Denies symptoms of hypoglycemia Note patient carries soft peppermint candies with him to use as rapid source of sugar if needed for lows Exercise: reports currently still walking ~15 minutes x most days/week Patient agreeable to goal of walking 30 minutes x most days/week  Hypertension: Current treatment: HCTZ 25 mg daily lisinopril 20 mg daily amlodipine 5 mg daily Denies symptoms of hypotension or hypertension Encourage patient to continue to have BP checked, keep a log of the results (including heart rate) and have this record for [place] to review during our calls  Hyperlipidemia: Controlled; current treatment: Atorvastatin 20 mg daily   Medication Adherence: Note his wife helps to manage his medications including his  Tresiba nightly Patient using weekly pillboxes as adherence aid. Have advised patient to also use medication list from latest office visit when refilling pillbox to confirm has all medications. Identify patient in need of refills of HCTZ, lisinopril, atorvastatin and Jardiance. Current Rx of each either out of refills or expired. Will collaborate with clinical team to request renewal of these Rx for patient   Tobacco Abuse: Reports smoking to ~10-12 cigarettes/day Discussed benefits of smoking cessation Motivation: health benefit and cost Reports thinking about quitting, but would want to do this with his wife Goal to quit by the end of the calendar year  Patient Goals/Self-Care Activities Over the next 90 days, patient will:  - check glucose, document, and provide at  future appointments - check blood pressure, document, and provide at future appointments - attend medical appointments as scheduled  Follow Up Plan: Telephone follow up appointment with care management team member scheduled for: 06/19/2021 at 3:45 PM      Wallace Cullens, PharmD, [place], [place] [phone]

## 2021-05-04 NOTE — Patient Instructions (Signed)
Thank you allowing the Care Management Team to be a part of your care! It was a pleasure speaking with you today!     Care Management Team    Noreene Larsson RN, MSN, CCM Nurse Care Coordinator  [phone]   Wallace Cullens, PharmD  Clinical Pharmacist  [phone]   Christa See, MSW, LCSW Clinical Social Worker [phone]   Visit Information   Goals Addressed             This Visit's Progress    Pharmacy Goals       Our goal A1c is less than 7%. This corresponds with fasting sugars less than 130 and 2 hour after meal sugars less than 180. Please check your fasting blood sugar and keep a log of the results  Our goal bad cholesterol, or LDL, is less than 70 . This is why it is important to continue taking your atorvastatin  Our goal blood pressure is less than 130/80. Please check your blood pressure and keep log of the results.  Feel free to call me with any questions or concerns. I look forward to our next call!  Wallace Cullens, PharmD, [place] [phone]        The patient verbalized understanding of instructions, educational materials, and care plan provided today and declined offer to receive copy of patient instructions, educational materials, and care plan.   Telephone follow up appointment with care management team member scheduled for: 06/19/2021 at 3:45 PM

## 2021-05-08 ENCOUNTER — Emergency Department: Payer: BC Managed Care – PPO

## 2021-05-08 ENCOUNTER — Emergency Department
Admission: EM | Admit: 2021-05-08 | Discharge: 2021-05-08 | Disposition: A | Discharge status: Home / Self Care | Payer: BC Managed Care – PPO | Attending: Emergency Medicine | Admitting: Emergency Medicine

## 2021-05-08 ENCOUNTER — Other Ambulatory Visit: Payer: Self-pay

## 2021-05-08 DIAGNOSIS — R1032 Left lower quadrant pain: Secondary | ICD-10-CM | POA: Diagnosis not present

## 2021-05-08 DIAGNOSIS — N1330 Unspecified hydronephrosis: Secondary | ICD-10-CM | POA: Diagnosis not present

## 2021-05-08 DIAGNOSIS — E119 Type 2 diabetes mellitus without complications: Secondary | ICD-10-CM | POA: Diagnosis not present

## 2021-05-08 DIAGNOSIS — R1904 Left lower quadrant abdominal swelling, mass and lump: Secondary | ICD-10-CM | POA: Insufficient documentation

## 2021-05-08 DIAGNOSIS — R918 Other nonspecific abnormal finding of lung field: Secondary | ICD-10-CM | POA: Diagnosis not present

## 2021-05-08 DIAGNOSIS — Z85048 Personal history of other malignant neoplasm of rectum, rectosigmoid junction, and anus: Secondary | ICD-10-CM | POA: Insufficient documentation

## 2021-05-08 DIAGNOSIS — G8929 Other chronic pain: Secondary | ICD-10-CM | POA: Insufficient documentation

## 2021-05-08 DIAGNOSIS — I10 Essential (primary) hypertension: Secondary | ICD-10-CM | POA: Insufficient documentation

## 2021-05-08 DIAGNOSIS — R1900 Intra-abdominal and pelvic swelling, mass and lump, unspecified site: Secondary | ICD-10-CM

## 2021-05-08 DIAGNOSIS — M25512 Pain in left shoulder: Secondary | ICD-10-CM | POA: Insufficient documentation

## 2021-05-08 DIAGNOSIS — I700 Atherosclerosis of aorta: Secondary | ICD-10-CM | POA: Diagnosis not present

## 2021-05-08 DIAGNOSIS — N2889 Other specified disorders of kidney and ureter: Secondary | ICD-10-CM | POA: Diagnosis not present

## 2021-05-08 DIAGNOSIS — C20 Malignant neoplasm of rectum: Secondary | ICD-10-CM | POA: Diagnosis not present

## 2021-05-08 DIAGNOSIS — K219 Gastro-esophageal reflux disease without esophagitis: Secondary | ICD-10-CM | POA: Insufficient documentation

## 2021-05-08 DIAGNOSIS — R109 Unspecified abdominal pain: Secondary | ICD-10-CM | POA: Diagnosis not present

## 2021-05-08 LAB — COMPREHENSIVE METABOLIC PANEL
ALT: 13 U/L (ref 0–44)
AST: 14 U/L — ABNORMAL LOW (ref 15–41)
Albumin: 3.6 g/dL (ref 3.5–5.0)
Alkaline Phosphatase: 90 U/L (ref 38–126)
Anion gap: 10 (ref 5–15)
BUN: 30 mg/dL — ABNORMAL HIGH (ref 8–23)
CO2: 28 mmol/L (ref 22–32)
Calcium: 9 mg/dL (ref 8.9–10.3)
Chloride: 99 mmol/L (ref 98–111)
Creatinine, Ser: 1.54 mg/dL — ABNORMAL HIGH (ref 0.61–1.24)
GFR, Estimated: 50 mL/min — ABNORMAL LOW (ref >60)
Glucose, Bld: 142 mg/dL — ABNORMAL HIGH (ref 70–99)
Potassium: 3 mmol/L — ABNORMAL LOW (ref 3.5–5.1)
Sodium: 137 mmol/L (ref 135–145)
Total Bilirubin: 0.8 mg/dL (ref 0.3–1.2)
Total Protein: 7.4 g/dL (ref 6.5–8.1)

## 2021-05-08 LAB — URINALYSIS, COMPLETE (UACMP) WITH MICROSCOPIC
Bilirubin Urine: NEGATIVE
Glucose, UA: >1000 mg/dL — AB
Ketones, ur: NEGATIVE mg/dL
Leukocytes,Ua: NEGATIVE
Nitrite: NEGATIVE
Protein, ur: NEGATIVE mg/dL
Specific Gravity, Urine: <1.005 — ABNORMAL LOW (ref 1.005–1.030)
Squamous Epithelial / HPF: NONE SEEN (ref 0–5)
pH: 5 (ref 5.0–8.0)

## 2021-05-08 LAB — CBC
HCT: 52.3 % — ABNORMAL HIGH (ref 39.0–52.0)
Hemoglobin: 17 g/dL (ref 13.0–17.0)
MCH: 26.9 pg (ref 26.0–34.0)
MCHC: 32.5 g/dL (ref 30.0–36.0)
MCV: 82.9 fL (ref 80.0–100.0)
Platelets: 201 10*3/uL (ref 150–400)
RBC: 6.31 MIL/uL — ABNORMAL HIGH (ref 4.22–5.81)
RDW: 15.8 % — ABNORMAL HIGH (ref 11.5–15.5)
WBC: 10.7 10*3/uL — ABNORMAL HIGH (ref 4.0–10.5)
nRBC: 0 % (ref 0.0–0.2)

## 2021-05-08 LAB — TROPONIN I (HIGH SENSITIVITY)
Troponin I (High Sensitivity): 15 ng/L (ref <18)
Troponin I (High Sensitivity): 13 ng/L (ref <18)

## 2021-05-08 LAB — LIPASE, BLOOD: Lipase: 23 U/L (ref 11–51)

## 2021-05-08 MED ORDER — FENTANYL CITRATE PF 50 MCG/ML IJ SOSY
50.0000 ug | PREFILLED_SYRINGE | Freq: Once | INTRAMUSCULAR | Status: AC
Start: 2021-05-08 — End: 2021-05-08
  Administered 2021-05-08: 50 ug via INTRAVENOUS
  Filled 2021-05-08: qty 1

## 2021-05-08 MED ORDER — OXYCODONE-ACETAMINOPHEN 5-325 MG PO TABS
1.0000 | ORAL_TABLET | ORAL | 0 refills | Status: DC | PRN
Start: 2021-05-08 — End: 2021-06-19

## 2021-05-08 MED ORDER — ONDANSETRON HCL 4 MG/2ML IJ SOLN
4.0000 mg | Freq: Once | INTRAMUSCULAR | Status: AC
  Administered 2021-05-08: 4 mg via INTRAVENOUS
  Filled 2021-05-08: qty 2

## 2021-05-08 MED ORDER — ONDANSETRON 4 MG PO TBDP: 4.0000 mg | ORAL_TABLET | Freq: Three times a day (TID) | ORAL | 0 refills | Status: DC | PRN

## 2021-05-08 MED ORDER — MORPHINE SULFATE (PF) 4 MG/ML IV SOLN
4.0000 mg | Freq: Once | INTRAVENOUS | Status: AC
  Administered 2021-05-08: 4 mg via INTRAVENOUS
  Filled 2021-05-08: qty 1

## 2021-05-08 MED ORDER — IOHEXOL 300 MG/ML  SOLN
100.0000 mL | Freq: Once | INTRAMUSCULAR | Status: AC | PRN
  Administered 2021-05-08: 75 mL via INTRAVENOUS

## 2021-05-08 NOTE — ED Notes (Signed)
Patient provided with urinal and warm blanket. Instructions given to call for assistance if he needs to stand to urinate since he just received Fentanyl that he has never had before. Patient's wife is also at bedside to remind him. Patient verbalizes no other needs or complaints at this time. Patient placed on monitoring equipment.

## 2021-05-08 NOTE — ED Notes (Addendum)
Patient returned from CT

## 2021-05-08 NOTE — ED Notes (Signed)
Patient provided with discharge instructions, script information, and follow-up information. Patient verbalized understanding. Patient ambulated out to the waiting room with a steady gait.

## 2021-05-08 NOTE — Discharge Instructions (Signed)
As we discussed, your CT shows a mass in the left lower abdominal region causing obstruction the left kidney.  This is the cause of your pain.  The mass is concerning for cancer.  It is unclear if this is a new cancer or recurrence of your rectal cancer.  Therefore it is very important that you call [facility] first thing in the morning for a very close follow-up for further diagnosis and management.  In the meantime I am sending you prescriptions for Percocet for pain.  Return to the emergency room if you have fever, burning or pain with urination, worsening abdominal pain.

## 2021-05-08 NOTE — ED Triage Notes (Signed)
Pt states he ate a sandwich on Saturday night and after eating began to have upper left abd pain. Pt states he has been having left shoulder pain extending down left arm for "awhile". Pt states one episode of emesis last night. Pt denies shob, dizziness.

## 2021-05-08 NOTE — ED Provider Notes (Signed)
[facility] Provider Note    Event Date/Time   First MD Initiated Contact with Patient 05/08/21 0031     (approximate)   History   Abdominal Pain   HPI  Anthony Bell is a 66 y.o. male with a history of diabetes, GERD, hypertension, rectal cancer on surveillance who presents for evaluation of abdominal pain.  Patient reports that the pain started yesterday after he had a sandwich of cheese in [place].  The pain initially was intermittent but has become constant over the last 24 hours and more severe this evening.  Patient was unable to sleep due to the pain.  Describes the pain as dull, constant, located in the left lower quadrant.  No nausea, vomiting, diarrhea, constipation, dysuria, hematuria.  Patient has never had similar pain.  No prior abdominal surgeries. No CP or SOB.  Patient does have left shoulder pain which is chronic and he was told by his PCP that it was due to arthritis.  No changes in that     Past Medical History:  Diagnosis Date   Colon polyps    Diabetes mellitus without complication (HCC)    GERD (gastroesophageal reflux disease)    Hypertension    Rectal cancer ([place])    [facility]    Past Surgical History:  Procedure Laterality Date   COLONOSCOPY WITH PROPOFOL N/A 09/21/2014   Procedure: COLONOSCOPY WITH PROPOFOL;  Surgeon: Lollie Sails, MD;  Location: [facility] ENDOSCOPY;  Service: Endoscopy;  Laterality: N/A;   ESOPHAGOGASTRODUODENOSCOPY N/A 09/21/2014   Procedure: ESOPHAGOGASTRODUODENOSCOPY (EGD);  Surgeon: Lollie Sails, MD;  Location: [facility] ENDOSCOPY;  Service: Endoscopy;  Laterality: N/A;   lymphnode removed       Physical Exam   Triage Vital Signs: ED Triage Vitals  Enc Vitals Group     BP 05/08/21 0027 (!) 159/82     Pulse Rate 05/08/21 0027 71     Resp 05/08/21 0027 18     Temp 05/08/21 0027 98.3 F (36.8 C)     Temp Source 05/08/21 0027 Oral     SpO2 05/08/21 0027 94 %     Weight 05/08/21 0027 197 lb (89.4 kg)      Height 05/08/21 0027 5\' 7"  (1.702 m)     Head Circumference --      Peak Flow --      Pain Score 05/08/21 0027 8     Pain Loc --      Pain Edu? --      Excl. in [place]? --     Most recent vital signs: Vitals:   05/08/21 0110 05/08/21 0112  BP: (!) 161/74   Pulse: 64   Resp:    Temp:    SpO2: 92% 93%     Constitutional: Alert and oriented. Well appearing and in no apparent distress. HEENT:      Head: Normocephalic and atraumatic.         Eyes: Conjunctivae are normal. Sclera is non-icteric.       Mouth/Throat: Mucous membranes are moist.       Neck: Supple with no signs of meningismus. Cardiovascular: Regular rate and rhythm. No murmurs, gallops, or rubs. 2+ symmetrical distal pulses are present in all extremities.  Respiratory: Normal respiratory effort. Lungs are clear to auscultation bilaterally.  Gastrointestinal: Soft, mild ttp over the LLQ, and non distended with positive bowel sounds. No rebound or guarding. Genitourinary: No CVA tenderness. Musculoskeletal:  No edema, cyanosis, or erythema of extremities. Neurologic: Normal speech and  language. Face is symmetric. Moving all extremities. No gross focal neurologic deficits are appreciated. Skin: Skin is warm, dry and intact. No rash noted. Psychiatric: Mood and affect are normal. Speech and behavior are normal.  ED Results / Procedures / Treatments   Labs (all labs ordered are listed, but only abnormal results are displayed) Labs Reviewed  COMPREHENSIVE METABOLIC PANEL - Abnormal; Notable for the following components:      Result Value   Potassium 3.0 (*)    Glucose, Bld 142 (*)    BUN 30 (*)    Creatinine, Ser 1.54 (*)    AST 14 (*)    GFR, Estimated 50 (*)    All other components within normal limits  CBC - Abnormal; Notable for the following components:   WBC 10.7 (*)    RBC 6.31 (*)    HCT 52.3 (*)    RDW 15.8 (*)    All other components within normal limits  URINALYSIS, COMPLETE (UACMP) WITH MICROSCOPIC  - Abnormal; Notable for the following components:   Specific Gravity, Urine <1.005 (*)    Glucose, UA >1,000 (*)    Hgb urine dipstick SMALL (*)    Bacteria, UA RARE (*)    All other components within normal limits  LIPASE, BLOOD  TROPONIN I (HIGH SENSITIVITY)  TROPONIN I (HIGH SENSITIVITY)     EKG  ED ECG REPORT I, Rudene Re, the attending physician, personally viewed and interpreted this ECG.  Sinus rhythm with a rate of 67, normal intervals, normal axis, slight ST depressions on inferior leads with no ST elevations.  No change when compared to prior.   RADIOLOGY I, Rudene Re, attending MD, have personally viewed and interpreted the images obtained during this visit as below:  CT showing L hydronephrosis caused by a pelvic mass   ___________________________________________________ Interpretation by Radiologist:  CT ABDOMEN PELVIS W CONTRAST  Result Date: 05/08/2021 CLINICAL DATA:  Left lower quadrant abdominal pain. EXAM: CT ABDOMEN AND PELVIS WITH CONTRAST TECHNIQUE: Multidetector CT imaging of the abdomen and pelvis was performed using the standard protocol following bolus administration of intravenous contrast. RADIATION DOSE REDUCTION: This exam was performed according to the departmental dose-optimization program which includes automated exposure control, adjustment of the mA and/or kV according to patient size and/or use of iterative reconstruction technique. CONTRAST:  21mL OMNIPAQUE IOHEXOL 300 MG/ML  SOLN COMPARISON:  None. FINDINGS: Lower chest: Bibasilar subpleural atelectasis. Two small nodules in the visualized right lung measure up to 5 mm, and indeterminate but concerning for metastatic disease. No intra-abdominal free air or free fluid. Hepatobiliary: There is fatty infiltration of the liver. Multiple liver cysts measure up to 5 cm in the left lobe. Several additional smaller hypodense lesions are not characterized but concerning for metastatic disease.  No intrahepatic biliary ductal dilatation. The gallbladder is unremarkable. Pancreas: Unremarkable. No pancreatic ductal dilatation or surrounding inflammatory changes. Spleen: Normal in size without focal abnormality. Adrenals/Urinary Tract: The adrenal glands unremarkable. The right kidney is unremarkable. There is mild left hydronephrosis with delayed excretion of contrast from the left kidney. There is obstruction of the proximal left ureter secondary to a 2 cm soft tissue mass which may represent a primary urothelial neoplasm or an enlarged/metastatic retroperitoneal lymph node. The distal left ureter and urinary bladder appear unremarkable. Stomach/Bowel: There is no bowel obstruction or active inflammation. The appendix is normal. Vascular/Lymphatic: Advanced calcified and noncalcified atherosclerotic plaque of the abdominal aorta. The IVC is unremarkable. No portal venous gas. Retroperitoneal adenopathy primarily involving  the left para-aortic region at the level of the left renal artery and vein. There is additionally para caval adenopathy. 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 Reproductive: The prostate and seminal vesicles are grossly unremarkable. Other: None Musculoskeletal: Degenerative changes of the spine. No acute osseous pathology. No suspicious bone lesions. IMPRESSION: 1. Mild left hydronephrosis secondary to obstruction of the proximal left ureter by a 2 cm soft tissue mass which may represent a primary urothelial neoplasm or an enlarged/metastatic retroperitoneal lymph node. 2. 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 Clinical correlation is recommended. 3. Right lung nodules as well as several small hepatic hypodense lesions concerning for metastatic disease. 4. Fatty liver. 5. Aortic Atherosclerosis (ICD10-I70.0). Electronically Signed   By: Anner Crete M.D.   On:  05/08/2021 02:18      PROCEDURES:  Critical Care performed: No  Procedures    IMPRESSION / MDM / ASSESSMENT AND PLAN / ED COURSE  I reviewed the triage vital signs and the nursing notes.  66 y.o. male with a history of diabetes, GERD, hypertension, rectal cancer on surveillance who presents for evaluation of abdominal pain.  Patient arrives a little over 24 hours of left lower quadrant dull abdominal pain.  On exam is well-appearing in no distress with normal vital signs.  Abdomen is soft with mild tenderness in the left lower quadrant, no guarding or rebound.  Ddx: Diverticulitis versus UTI versus kidney stones versus appendicitis versus gastritis versus peptic ulcer disease versus pancreatitis   Plan: CBC, CMP, lipase, urinalysis, CT abdomen pelvis, EKG.  Will give IV fentanyl and Zofran for symptoms   MEDICATIONS GIVEN IN ED: Medications  fentaNYL (SUBLIMAZE) injection 50 mcg (50 mcg Intravenous Given 05/08/21 0112)  ondansetron (ZOFRAN) injection 4 mg (4 mg Intravenous Given 05/08/21 0111)  iohexol (OMNIPAQUE) 300 MG/ML solution 100 mL (75 mLs Intravenous Contrast Given 05/08/21 0156)  morphine (PF) 4 MG/ML injection 4 mg (4 mg Intravenous Given 05/08/21 0241)     ED COURSE: Unfortunately CT is concerning for left-sided hydronephrosis caused by an obstructing mass in the left lower quadrant.  With a history of rectal cancer this could be a recurrence of his cancer or new urothelial cancer.  UA does not show any signs of an infection.  Mildly elevated white count.  Kidney function seems to be at baseline.  EKG and troponin are negative.  Pain is well controlled.  Admission was considered but with normal labs and the fact the patient has a cancer doctor at [facility] I feel that is in his best interest to be discharged now for close follow-up with his oncologist for a PET scan and possible biopsy.  In the meantime I will provide him with a prescription for Percocet and Zofran for symptom  control at home.  Recommended returning to the emergency room for uncontrolled pain or any signs of a UTI.  The plan, findings, and recommendations were discussed with patient and his wife was at bedside.   Consults: None   EMR reviewed including last visit with his oncologist from December 2021    FINAL CLINICAL IMPRESSION(S) / ED DIAGNOSES   Final diagnoses:  Abdominal mass, unspecified abdominal location     Rx / DC Orders   ED Discharge Orders          Ordered    oxyCODONE-acetaminophen (PERCOCET) 5-325 MG tablet  Every 4 hours PRN        05/08/21 0330    ondansetron (  ZOFRAN-ODT) 4 MG disintegrating tablet  Every 8 hours PRN        05/08/21 0330             Note:  This document was prepared using Dragon voice recognition software and may include unintentional dictation errors.   Please note:  Patient was evaluated in Emergency Department today for the symptoms described in the history of present illness. Patient was evaluated in the context of the global COVID-19 pandemic, which necessitated consideration that the patient might be at risk for infection with the SARS-CoV-2 virus that causes COVID-19. Institutional protocols and algorithms that pertain to the evaluation of patients at risk for COVID-19 are in a state of rapid change based on information released by regulatory bodies including the CDC and federal and state organizations. These policies and algorithms were followed during the patient's care in the ED.  Some ED evaluations and interventions may be delayed as a result of limited staffing during the pandemic.       [name], [place], MD 05/08/21 [phone]

## 2021-05-11 ENCOUNTER — Telehealth: Payer: BC Managed Care – PPO

## 2021-05-19 DIAGNOSIS — Z7984 Long term (current) use of oral hypoglycemic drugs: Secondary | ICD-10-CM | POA: Diagnosis not present

## 2021-05-19 DIAGNOSIS — I10 Essential (primary) hypertension: Secondary | ICD-10-CM | POA: Diagnosis not present

## 2021-05-19 DIAGNOSIS — Z7982 Long term (current) use of aspirin: Secondary | ICD-10-CM | POA: Diagnosis not present

## 2021-05-19 DIAGNOSIS — L048 Acute lymphadenitis of other sites: Secondary | ICD-10-CM | POA: Diagnosis not present

## 2021-05-19 DIAGNOSIS — N1330 Unspecified hydronephrosis: Secondary | ICD-10-CM | POA: Diagnosis not present

## 2021-05-19 DIAGNOSIS — K219 Gastro-esophageal reflux disease without esophagitis: Secondary | ICD-10-CM | POA: Diagnosis not present

## 2021-05-19 DIAGNOSIS — Z85048 Personal history of other malignant neoplasm of rectum, rectosigmoid junction, and anus: Secondary | ICD-10-CM | POA: Diagnosis not present

## 2021-05-19 DIAGNOSIS — C772 Secondary and unspecified malignant neoplasm of intra-abdominal lymph nodes: Secondary | ICD-10-CM | POA: Diagnosis not present

## 2021-05-19 DIAGNOSIS — Z9889 Other specified postprocedural states: Secondary | ICD-10-CM | POA: Diagnosis not present

## 2021-05-19 DIAGNOSIS — C20 Malignant neoplasm of rectum: Secondary | ICD-10-CM | POA: Diagnosis not present

## 2021-05-19 DIAGNOSIS — N131 Hydronephrosis with ureteral stricture, not elsewhere classified: Secondary | ICD-10-CM | POA: Diagnosis not present

## 2021-05-19 DIAGNOSIS — C969 Malignant neoplasm of lymphoid, hematopoietic and related tissue, unspecified: Secondary | ICD-10-CM | POA: Diagnosis not present

## 2021-05-19 DIAGNOSIS — Z79899 Other long term (current) drug therapy: Secondary | ICD-10-CM | POA: Diagnosis not present

## 2021-05-19 DIAGNOSIS — C801 Malignant (primary) neoplasm, unspecified: Secondary | ICD-10-CM | POA: Diagnosis not present

## 2021-05-19 DIAGNOSIS — E119 Type 2 diabetes mellitus without complications: Secondary | ICD-10-CM | POA: Diagnosis not present

## 2021-05-19 DIAGNOSIS — R590 Localized enlarged lymph nodes: Secondary | ICD-10-CM | POA: Diagnosis not present

## 2021-05-19 DIAGNOSIS — F17210 Nicotine dependence, cigarettes, uncomplicated: Secondary | ICD-10-CM | POA: Diagnosis not present

## 2021-05-19 DIAGNOSIS — Z20822 Contact with and (suspected) exposure to covid-19: Secondary | ICD-10-CM | POA: Diagnosis not present

## 2021-05-19 DIAGNOSIS — Z833 Family history of diabetes mellitus: Secondary | ICD-10-CM | POA: Diagnosis not present

## 2021-05-19 DIAGNOSIS — Z8249 Family history of ischemic heart disease and other diseases of the circulatory system: Secondary | ICD-10-CM | POA: Diagnosis not present

## 2021-05-20 DIAGNOSIS — I10 Essential (primary) hypertension: Secondary | ICD-10-CM | POA: Diagnosis not present

## 2021-05-20 DIAGNOSIS — N1330 Unspecified hydronephrosis: Secondary | ICD-10-CM | POA: Diagnosis not present

## 2021-05-20 DIAGNOSIS — L048 Acute lymphadenitis of other sites: Secondary | ICD-10-CM | POA: Diagnosis not present

## 2021-05-20 DIAGNOSIS — Z79899 Other long term (current) drug therapy: Secondary | ICD-10-CM | POA: Diagnosis not present

## 2021-05-20 DIAGNOSIS — Z833 Family history of diabetes mellitus: Secondary | ICD-10-CM | POA: Diagnosis not present

## 2021-05-20 DIAGNOSIS — C969 Malignant neoplasm of lymphoid, hematopoietic and related tissue, unspecified: Secondary | ICD-10-CM | POA: Diagnosis not present

## 2021-05-20 DIAGNOSIS — F17210 Nicotine dependence, cigarettes, uncomplicated: Secondary | ICD-10-CM | POA: Diagnosis not present

## 2021-05-20 DIAGNOSIS — N131 Hydronephrosis with ureteral stricture, not elsewhere classified: Secondary | ICD-10-CM | POA: Diagnosis not present

## 2021-05-20 DIAGNOSIS — Z20822 Contact with and (suspected) exposure to covid-19: Secondary | ICD-10-CM | POA: Diagnosis not present

## 2021-05-20 DIAGNOSIS — Z7984 Long term (current) use of oral hypoglycemic drugs: Secondary | ICD-10-CM | POA: Diagnosis not present

## 2021-05-20 DIAGNOSIS — Z9889 Other specified postprocedural states: Secondary | ICD-10-CM | POA: Diagnosis not present

## 2021-05-20 DIAGNOSIS — Z8249 Family history of ischemic heart disease and other diseases of the circulatory system: Secondary | ICD-10-CM | POA: Diagnosis not present

## 2021-05-20 DIAGNOSIS — Z85048 Personal history of other malignant neoplasm of rectum, rectosigmoid junction, and anus: Secondary | ICD-10-CM | POA: Diagnosis not present

## 2021-05-20 DIAGNOSIS — E119 Type 2 diabetes mellitus without complications: Secondary | ICD-10-CM | POA: Diagnosis not present

## 2021-05-20 DIAGNOSIS — Z7982 Long term (current) use of aspirin: Secondary | ICD-10-CM | POA: Diagnosis not present

## 2021-05-20 DIAGNOSIS — K219 Gastro-esophageal reflux disease without esophagitis: Secondary | ICD-10-CM | POA: Diagnosis not present

## 2021-05-25 ENCOUNTER — Telehealth: Payer: Self-pay

## 2021-05-25 NOTE — Telephone Encounter (Signed)
?  Care Management  ? ?Follow Up Note ? ? ?05/25/2021 ?Name: Anthony Bell MRN: [id number] DOB: [birth year] ? ? ?Referred by: [name], NP ?Reason for referral : Care Coordination (RNCM: Follow up for Chronic Disease Management and Care Coordination Needs ) ? ? ?An unsuccessful telephone outreach was attempted today. The patient was referred to the case management team for assistance with care management and care coordination.  ? ?Follow Up Plan: A HIPPA compliant phone message was left for the patient providing contact information and requesting a return call.  ?Noreene Larsson RN, MSN, CCM ?Community Care Coordinator ?[facility] ?[facility] ?Mobile: [phone]  ?

## 2021-06-05 ENCOUNTER — Telehealth: Payer: Self-pay

## 2021-06-05 NOTE — Progress Notes (Signed)
error 

## 2021-06-08 ENCOUNTER — Telehealth: Payer: Self-pay

## 2021-06-08 DIAGNOSIS — C772 Secondary and unspecified malignant neoplasm of intra-abdominal lymph nodes: Secondary | ICD-10-CM | POA: Diagnosis not present

## 2021-06-08 DIAGNOSIS — C20 Malignant neoplasm of rectum: Secondary | ICD-10-CM | POA: Diagnosis not present

## 2021-06-08 DIAGNOSIS — X503XXA Overexertion from repetitive movements, initial encounter: Secondary | ICD-10-CM | POA: Diagnosis not present

## 2021-06-08 DIAGNOSIS — F17200 Nicotine dependence, unspecified, uncomplicated: Secondary | ICD-10-CM | POA: Diagnosis not present

## 2021-06-08 NOTE — Chronic Care Management (AMB) (Signed)
?  Care Management  ? ?Note ? ?06/08/2021 ?Name: Dougles Kimmey MRN: [id number] DOB: [birth year] ? ?Anthony Bell is a 66 y.o. year old male who is a primary care patient of Garnette Gunner, [name], NP and is actively engaged with the care management team. I reached out to Montez Morita by phone today to assist with re-scheduling a follow up visit with the RN Case Manager ? ?Follow up plan: ?Unsuccessful telephone outreach attempt made. A HIPAA compliant phone message was left for the patient providing contact information and requesting a return call.  ?The care management team will reach out to the patient again over the next 7 days.  ?If patient returns call to provider office, please advise to call [place]  at [phone] ? ?Noreene Larsson, RMA ?Care Guide, Embedded Care Coordination ?[facility]  ?[place], [place] [address] ?Direct Dial: [phone] ?Museum/[email or web address] ?Website: [email or web address]  ? ?

## 2021-06-14 ENCOUNTER — Other Ambulatory Visit: Payer: Self-pay | Admitting: Unknown Physician Specialty

## 2021-06-15 NOTE — Telephone Encounter (Signed)
Requested Prescriptions  ?Pending Prescriptions Disp Refills  ?? metFORMIN (GLUCOPHAGE) 1000 MG tablet [Pharmacy Med Name: METFORMIN HCL 1,000 MG TABLET] 180 tablet 3  ?  Sig: TAKE 1 TABLET (1,000 MG TOTAL) BY MOUTH 2 (TWO) TIMES DAILY WITH A MEAL.  ?  ? Endocrinology:  Diabetes - Biguanides Failed - 06/14/2021  2:12 AM  ?  ?  Failed - Cr in normal range and within 360 days  ?  Creat  ?Date Value Ref Range Status  ?03/30/2021 1.32 0.70 - 1.35 mg/dL Final  ? ?Creatinine, Ser  ?Date Value Ref Range Status  ?05/08/2021 1.54 (H) 0.61 - 1.24 mg/dL Final  ? ?Creatinine, Urine  ?Date Value Ref Range Status  ?03/30/2021 79 20 - 320 mg/dL Final  ?   ?  ?  Failed - eGFR in normal range and within 360 days  ?  GFR, Est African American  ?Date Value Ref Range Status  ?09/20/2020 70 > OR = 60 mL/min/1.61m Final  ? ?GFR, Est Non African American  ?Date Value Ref Range Status  ?09/20/2020 60 > OR = 60 mL/min/1.771mFinal  ? ?GFR, Estimated  ?Date Value Ref Range Status  ?05/08/2021 50 (L) >60 mL/min Final  ?  Comment:  ?  (NOTE) ?Calculated using the CKD-EPI Creatinine Equation (2021) ?  ? ?eGFR  ?Date Value Ref Range Status  ?03/30/2021 61 > OR = 60 mL/min/1.7380minal  ?  Comment:  ?  The eGFR is based on the CKD-EPI 2021 equation. To calculate  ?the new eGFR from a previous Creatinine or Cystatin C ?result, go to https://www.kidney.org/professionals/ ?kdoqi/gfr%5Fcalculator ?  ?   ?  ?  Failed - B12 Level in normal range and within 720 days  ?  No results found for: VITAMINB12   ?  ?  Failed - CBC within normal limits and completed in the last 12 months  ?  WBC  ?Date Value Ref Range Status  ?05/08/2021 10.7 (H) 4.0 - 10.5 K/uL Final  ? ?RBC  ?Date Value Ref Range Status  ?05/08/2021 6.31 (H) 4.22 - 5.81 MIL/uL Final  ? ?Hemoglobin  ?Date Value Ref Range Status  ?05/08/2021 17.0 13.0 - 17.0 g/dL Final  ? ?HGB  ?Date Value Ref Range Status  ?10/10/2011 14.9 13.0 - 18.0 g/dL Final  ? ?HCT  ?Date Value Ref Range Status   ?05/08/2021 52.3 (H) 39.0 - 52.0 % Final  ?10/10/2011 46.9 40.0 - 52.0 % Final  ? ?MCHC  ?Date Value Ref Range Status  ?05/08/2021 32.5 30.0 - 36.0 g/dL Final  ? ?MCH  ?Date Value Ref Range Status  ?05/08/2021 26.9 26.0 - 34.0 pg Final  ? ?MCV  ?Date Value Ref Range Status  ?05/08/2021 82.9 80.0 - 100.0 fL Final  ?10/10/2011 87 80 - 100 fL Final  ? ?No results found for: PLTCOUNTKUC, LABPLAT, POCCalvinDW  ?Date Value Ref Range Status  ?05/08/2021 15.8 (H) 11.5 - 15.5 % Final  ?10/10/2011 14.3 11.5 - 14.5 % Final  ? ?  ?  ?  Passed - HBA1C is between 0 and 7.9 and within 180 days  ?  Hgb A1c MFr Bld  ?Date Value Ref Range Status  ?03/30/2021 6.8 (H) <5.7 % of total Hgb Final  ?  Comment:  ?  For someone without known diabetes, a hemoglobin A1c ?value of 6.5% or greater indicates that they may have  ?diabetes and this should be confirmed with a follow-up  ?test. ?. ?For someone with known diabetes, a  value <7% indicates  ?that their diabetes is well controlled and a value  ?greater than or equal to 7% indicates suboptimal  ?control. A1c targets should be individualized based on  ?duration of diabetes, age, comorbid conditions, and  ?other considerations. ?. ?Currently, no consensus exists regarding use of ?hemoglobin A1c for diagnosis of diabetes for children. ?. ?  ?   ?  ?  Passed - Valid encounter within last 6 months  ?  Recent Outpatient Visits   ?      ? 2 months ago Encounter for general adult medical examination with abnormal findings  ? [facility] [place], [place] W, NP  ? 9 months ago Type 2 diabetes mellitus with hyperglycemia, without long-term current use of insulin ([place])  ? [facility] [place], [place] W, NP  ? 11 months ago Depression, unspecified depression type  ? [facility] [name], NP  ? 1 year ago Type 2 diabetes mellitus with hyperglycemia, without long-term current use of insulin ([place])  ? [facility] [name], NP  ? 1 year  ago Type 2 diabetes mellitus with hyperglycemia, without long-term current use of insulin ([place])  ? [facility] [name], NP  ?  ?  ?Future Appointments   ?        ? In 3 months Baity, [name], NP [facility], [place]  ?  ? ?  ?  ?  ? ?

## 2021-06-19 ENCOUNTER — Ambulatory Visit: Payer: BC Managed Care – PPO | Admitting: Pharmacist

## 2021-06-19 DIAGNOSIS — E1165 Type 2 diabetes mellitus with hyperglycemia: Secondary | ICD-10-CM

## 2021-06-19 DIAGNOSIS — I10 Essential (primary) hypertension: Secondary | ICD-10-CM

## 2021-06-19 NOTE — Chronic Care Management (AMB) (Signed)
?Care Management  ? ?Pharmacy Note ? ?06/19/2021 ?Name: Anthony Bell MRN: [id number] DOB: 11/08/1955 ? ?Subjective: ?Anthony Bell is a 66 y.o. year old male who is a primary care patient of [name], NP. The Care Management team was consulted for assistance with care management and care coordination needs.   ? ?Engaged with patient by telephone for follow up visit in response to provider referral for pharmacy case management and/or care coordination services.  ? ?The patient was given information about Care Management services today including:  ?Care Management services includes personalized support from designated clinical staff supervised by the patient's primary care provider, including individualized plan of care and coordination with other care providers. ?24/7 contact phone numbers for assistance for urgent and routine care needs. ?The patient may stop case management services at any time by phone call to the office staff. ? ?Patient agreed to services and consent obtained. ? ?Assessment:  Review of patient status, including review of consultants reports, laboratory and other test data, was performed as part of comprehensive evaluation and provision of chronic care management services.  ? ?SDOH (Social Determinants of Health) assessments and interventions performed:   ? ?Objective: ? ?Lab Results  ?Component Value Date  ? CREATININE 1.54 (H) 05/08/2021  ? CREATININE 1.32 03/30/2021  ? CREATININE 1.26 (H) 09/20/2020  ? ? ?Lab Results  ?Component Value Date  ? HGBA1C 6.8 (H) 03/30/2021  ? ? ? ?BP Readings from Last 3 Encounters:  ?05/08/21 121/78  ?03/30/21 (!) 131/58  ?10/17/20 (!) 120/40  ? ? ?Care Plan ? ?No Known Allergies ? ?Medications Reviewed Today   ? ? Reviewed by [name], RPH-CPP (Pharmacist) on 06/19/21 at 1603  Med List Status: <None>  ? ?Medication Order Taking? Sig Documenting Provider Last Dose Status Informant  ?amLODipine (NORVASC) 5 MG tablet [id number] Yes TAKE 1 TABLET (5  MG TOTAL) BY MOUTH DAILY. [name], NP Taking Active   ?aspirin EC 81 MG tablet [id number] Yes Take 81 mg by mouth.  [provider] Taking Active Self  ?atorvastatin (LIPITOR) 20 MG tablet [id number] Yes Take 1 tablet (20 mg total) by mouth daily. [name], NP Taking Active   ?blood glucose meter kit and supplies KIT [id number]  Dispense based on patient and insurance preference. Use up to four times daily as directed. 100 strips and lancets please [name], NP  Active   ?Blood Glucose Monitoring Suppl (CONTOUR NEXT EZ) w/Device KIT [id number]  USE UP TO FOUR TIMES DAILY AS DIRECTED. (FOR ICD-10 E10.9, E11.9). [name], DO  Active   ?CONTOUR NEXT TEST test strip [id number]  USE AS DIRECTED UPTO 4 TIMES A DAY [name], NP  Active   ?empagliflozin (JARDIANCE) 10 MG TABS tablet [id number] Yes Take 1 tablet (10 mg total) by mouth daily before breakfast. [name], NP Taking Active   ?hydrochlorothiazide (HYDRODIURIL) 25 MG tablet [id number] Yes Take 1 tablet (25 mg total) by mouth daily. [name], NP Taking Active   ?insulin degludec (TRESIBA) 100 UNIT/ML FlexTouch Pen [id number] Yes Inject 20 Units into the skin daily. [name], NP Taking Active   ?Insulin Pen Needle 32G X 4 MM MISC [id number]  1 Units by Does not apply route every morning. Pen needles [name], NP  Active   ?lisinopril (ZESTRIL) 20 MG tablet [id number] Yes Take 1 tablet (20 mg total) by mouth daily. [name], NP Taking Active   ?metFORMIN (GLUCOPHAGE) 1000 MG tablet [id number] Yes  TAKE 1 TABLET (1,000 MG TOTAL) BY MOUTH 2 (TWO) TIMES DAILY WITH A MEAL. [name], NP Taking Active   ?ondansetron (ZOFRAN-ODT) 4 MG disintegrating tablet [id number] No Take 1 tablet (4 mg total) by mouth every 8 (eight) hours as needed for nausea or vomiting.  ?Patient not taking: Reported on 06/19/2021  ? [name], [place], MD Not Taking Active   ?tadalafil (CIALIS) 5 MG tablet [id number]  Take  1 tablet (5 mg total) by mouth daily as needed for erectile dysfunction. Malfi, Lupita Raider, FNP  Active   ? ?  ?  ? ?  ? ? ?Patient Active Problem List  ? Diagnosis Date Noted  ? Overweight with body mass index (BMI) of 29 to 29.9 in adult 09/13/2020  ? COPD (chronic obstructive pulmonary disease) ([place]) 06/14/2020  ? Hyperlipidemia 03/23/2020  ? Erectile dysfunction 03/22/2020  ? Type 2 diabetes mellitus ([place]) 08/23/2017  ? Gastroesophageal reflux disease 12/07/2014  ? Essential hypertension 12/07/2014  ? History of rectal cancer 12/07/2014  ? ? ? ?Care Plan : PharmD Medication Management  ?Updates made by [name], RPH-CPP since 06/19/2021 12:00 AM  ?  ? ?Problem: Disease Progression   ?  ? ?Long-Range Goal: Disease Progression Prevented or Minimized   ?Start Date: 06/06/2020  ?Expected End Date: 09/04/2020  ?Recent Progress: On track  ?Priority: High  ?Note:   ?Current Barriers:  ?Financial barriers ?Using copayment card from manufacturers for [place] ?Lack of blood pressure results for clinical team ? ?Pharmacist Clinical Goal(s):  ?Over the next 90 days, patient will maintain control of blood sugar as evidenced by A1C <7% through collaboration with PharmD and provider.  ? 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 ?Today patient reports next follow up with [facility] scheduled for 3/29 and that port for chemotherapy will be  placed at that time ?Reports his wife is completing financial assistance paperwork as provided by Oncologist and will bring this back to Oncology clinic at upcoming appointment ?Comprehensive medication review performed; medication list updated in electronic medical record ?Collaborate with [facility] today to provide Oncology team with contact information for PCP as well as to collaborate to update medication list in [place] system to confirm team aware patient currently on Tresiba insulin. ?From review of recent labs from 2/25 from [facility] shared record, patient's eGFR has decreased to ~42 ?Will collaborate with Dr. Parks Ranger, covering for PCP, to recommend decreasing patient's metformin dose from 1000 mg twice daily to 500 mg twice daily ? ?T2DM: ?Controlled; current treatment: ?Metformin 1000 mg twice daily with meals ?Jardiance 10 mg daily before breakfast ?Tresiba 20 units daily ?Reports recent before supper CBGs ranging in 110s ?Denies symptoms of hypoglycemia ?Note patient carries soft peppermint candies with him to use as rapid source of sugar if needed for lows ? ?Hypertension: ?Current treatment: ?HCTZ 25 mg daily ?lisinopril 20 mg daily ?amlodipine 5 mg daily ?Denies symptoms of hypotension or hypertension ?  ?  ?Tobacco Abuse: ?Reports trying to  stop smoking prior to the start of chemotherapy. Currently working on cutting back. Down to 2 cigarettes today ?Note Oncologist placed referral for smoking cessation support ? ?Patient Goals/Self-Care Activities ?Over the next 90 days, patient will:  ?- check glucose, document, and provide at future appointments ?- check blood pressure, document, and provide at future appointments ?- attend medical appointments as scheduled ? ?Follow Up Plan: Telephone follow up appointment with care management team member scheduled for: 07/17/2021 at 3:45 PM ? ?  ? ? ?Wallace Cullens, PharmD, BCACP, CPP ?Clinical Pharmacist ?[facility] ?[place] ?[phone] ? ? ? ?

## 2021-06-19 NOTE — Patient Instructions (Signed)
Thank you allowing the Care Management Team to be a part of your care! It was a pleasure speaking with you today! ?  ?  ?Care Management Team  ?  ?Noreene Larsson RN, MSN, CCM ?Nurse Care Coordinator  ?[phone] ?  ?Wallace Cullens, PharmD  ?Clinical Pharmacist  ?[phone] ?  ?Christa See, MSW, LCSW ?Clinical Social Worker ?[phone] ? ? ?Visit Information ? ? Goals Addressed   ? ?  ?  ?  ?  ? This Visit's Progress  ?  Pharmacy Goals     ?  Our goal A1c is less than 7%. This corresponds with fasting sugars less than 130 and 2 hour after meal sugars less than 180. Please check your fasting blood sugar and keep a log of the results ? ?Our goal bad cholesterol, or LDL, is less than 70 . This is why it is important to continue taking your atorvastatin ? ?Our goal blood pressure is less than 130/80. Please check your blood pressure and keep log of the results. ? ?Feel free to call me with any questions or concerns. I look forward to our next call! ? ? ?Wallace Cullens, PharmD, BCACP ?Clinical Pharmacist ?[facility] ?[place] ?[phone] ?  ? ?  ? ? ?The patient verbalized understanding of instructions, educational materials, and care plan provided today and declined offer to receive copy of patient instructions, educational materials, and care plan.  ? ?Telephone follow up appointment with care management team member scheduled for: 07/17/2021 at 3:45 PM ?

## 2021-06-20 ENCOUNTER — Ambulatory Visit: Payer: Self-pay | Admitting: Pharmacist

## 2021-06-20 DIAGNOSIS — Z794 Long term (current) use of insulin: Secondary | ICD-10-CM

## 2021-06-20 NOTE — Patient Instructions (Signed)
Thank you allowing the Care Management Team to be a part of your care! It was a pleasure speaking with you today! ?  ?  ?Care Management Team  ?  ?Noreene Larsson RN, MSN, CCM ?Nurse Care Coordinator  ?[phone] ?  ?Wallace Cullens, PharmD  ?Clinical Pharmacist  ?[phone] ?  ?Christa See, MSW, LCSW ?Clinical Social Worker ?[phone] ? ? ?Visit Information ? ? Goals Addressed   ? ?  ?  ?  ?  ? This Visit's Progress  ?  Pharmacy Goals     ?  Our goal A1c is less than 7%. This corresponds with fasting sugars less than 130 and 2 hour after meal sugars less than 180. Please check your fasting blood sugar and keep a log of the results ? ?Our goal bad cholesterol, or LDL, is less than 70 . This is why it is important to continue taking your atorvastatin ? ?Our goal blood pressure is less than 130/80. Please check your blood pressure and keep log of the results. ? ?Feel free to call me with any questions or concerns. I look forward to our next call! ? ? ? ?Wallace Cullens, PharmD, BCACP ?Clinical Pharmacist ?[facility] ?[place] ?[phone] ?  ? ?  ? ? ?The patient verbalized understanding of instructions, educational materials, and care plan provided today and declined offer to receive copy of patient instructions, educational materials, and care plan.  ? ?Telephone follow up appointment with care management team member scheduled for: 07/17/2021 at 3:45 PM ? ?

## 2021-06-20 NOTE — Chronic Care Management (AMB) (Signed)
?Care Management  ? ?Pharmacy Note ? ?06/20/2021 ?Name: Anthony Bell MRN: [id number] DOB: [birth year] ? ?Subjective: ?Anthony Bell is a 66 y.o. year old male who is a primary care patient of [name], NP. The Care Management team was consulted for assistance with care management and care coordination needs.   ? ?Engaged with patient by telephone for follow up visit in response to provider referral for pharmacy case management and/or care coordination services.  ? ?The patient was given information about Care Management services today including:  ?Care Management services includes personalized support from designated clinical staff supervised by the patient's primary care provider, including individualized plan of care and coordination with other care providers. ?24/7 contact phone numbers for assistance for urgent and routine care needs. ?The patient may stop case management services at any time by phone call to the office staff. ? ?Patient agreed to services and consent obtained. ? ?Assessment:  Review of patient status, including review of consultants reports, laboratory and other test data, was performed as part of comprehensive evaluation and provision of chronic care management services.  ? ?SDOH (Social Determinants of Health) assessments and interventions performed:   ? ?Objective: ? ?Lab Results  ?Component Value Date  ? CREATININE 1.54 (H) 05/08/2021  ? CREATININE 1.32 03/30/2021  ? CREATININE 1.26 (H) 09/20/2020  ?Per shared record from [facility], creatinine 1.8 mg/dL on 05/20/2021; eGFR ~42 ? ? ? ?Care Plan ? ?No Known Allergies ? ?Medications  ? ? Reviewed by [name], RPH-CPP (Pharmacist) on 06/20/21 at [phone]  Med List Status: <None>  ? ?Medication Order Taking? Sig Documenting Provider Last Dose Status Informant  ?amLODipine (NORVASC) 5 MG tablet [id number] No TAKE 1 TABLET (5 MG TOTAL) BY MOUTH DAILY. [name], NP Taking Active   ?aspirin EC 81 MG tablet  [id number] No Take 81 mg by mouth.  [provider] Taking Active Self  ?atorvastatin (LIPITOR) 20 MG tablet [id number] No Take 1 tablet (20 mg total) by mouth daily. [name], NP Taking Active   ?blood glucose meter kit and supplies KIT [id number] No Dispense based on patient and insurance preference. Use up to four times daily as directed. 100 strips and lancets please [name], NP Taking Active   ?Blood Glucose Monitoring Suppl (CONTOUR NEXT EZ) w/Device KIT [id number] No USE UP TO FOUR TIMES DAILY AS DIRECTED. (FOR ICD-10 E10.9, E11.9). [name], DO Taking Active   ?CONTOUR NEXT TEST test strip [id number] No USE AS DIRECTED UPTO 4 TIMES A DAY [name], NP Taking Active   ?empagliflozin (JARDIANCE) 10 MG TABS tablet [id number] No Take 1 tablet (10 mg total) by mouth daily before breakfast. [name], NP Taking Active   ?hydrochlorothiazide (HYDRODIURIL) 25 MG tablet [id number] No Take 1 tablet (25 mg total) by mouth daily. [name], NP Taking Active   ?insulin degludec (TRESIBA) 100 UNIT/ML FlexTouch Pen [id number] No Inject 20 Units into the skin daily. [name], NP Taking Active   ?Insulin Pen Needle 32G X 4 MM MISC [id number] No 1 Units by Does not apply route every morning. Pen needles [name], NP Taking Active   ?lisinopril (ZESTRIL) 20 MG tablet [id number] No Take 1 tablet (20 mg total) by mouth daily. [name], NP Taking Active   ?metFORMIN (GLUCOPHAGE) 1000 MG tablet [id number] No TAKE 1 TABLET (1,000 MG TOTAL) BY MOUTH 2 (TWO) TIMES DAILY WITH A MEAL.  ?Patient taking differently: Take 500 mg by  mouth 2 (two) times daily with a meal.  ? [name], NP Taking Active   ?ondansetron (ZOFRAN-ODT) 4 MG disintegrating tablet [id number] No Take 1 tablet (4 mg total) by mouth every 8 (eight) hours as needed for nausea or vomiting.  ?Patient not taking: Reported on 06/19/2021  ? [name], [place], MD Not Taking Active   ?tadalafil (CIALIS) 5  MG tablet [id number] No Take 1 tablet (5 mg total) by mouth daily as needed for erectile dysfunction. Malfi, Lupita Raider, FNP Taking Active   ? ?  ?  ? ?  ? ? ?Patient Active Problem List  ? Diagnosis Date Noted  ? Overweight with body mass index (BMI) of 29 to 29.9 in adult 09/13/2020  ? COPD (chronic obstructive pulmonary disease) ([place]) 06/14/2020  ? Hyperlipidemia 03/23/2020  ? Erectile dysfunction 03/22/2020  ? Type 2 diabetes mellitus ([place]) 08/23/2017  ? Gastroesophageal reflux disease 12/07/2014  ? Essential hypertension 12/07/2014  ? History of rectal cancer 12/07/2014  ? ? ? ?Care Plan : PharmD Medication Management  ?Updates made by [name], RPH-CPP since 06/20/2021 12:00 AM  ?  ? ?Problem: Disease Progression   ?  ? ?Long-Range Goal: Disease Progression Prevented or Minimized   ?Start Date: 06/06/2020  ?Expected End Date: 09/04/2020  ?Recent Progress: On track  ?Priority: High  ?Note:   ?Current Barriers:  ?Financial barriers ?Using copayment card from manufacturers for [place] ?Lack of blood pressure results for clinical team ? ?Pharmacist Clinical Goal(s):  ?Over the next 90 days, patient will maintain control of blood sugar as evidenced by A1C <7% through collaboration with PharmD and provider.  ? ?Interventions: ?1:1 collaboration with Webb Silversmith NP regarding development and update of comprehensive plan of care as evidenced by provider attestation and co-signature ?Inter-disciplinary care team collaboration (see longitudinal plan of care) ? ?Renal Dose Adjustment: ?From review of recent labs from 2/25 from [facility] shared record, patient's eGFR has decreased to ~42 ?Collaborated with Dr. Parks Ranger, covering for PCP, to recommend decreasing patient's metformin dose from 1000 mg twice daily to 500 mg twice daily. Provider agreed. ?Follow up with patient today to let him know to reduce his metformin 1044m to ? tablet (500 mg) twice daily ?Patient verbalizes understanding  via teach back ?[facility] ? ? ?Patient Goals/Self-Care Activities ?Over the next 90 days, patient will:  ?- check glucose, document, and provide at future appointments ?- check blood pressure, document, and provide at future appointments ?- attend medical appointments as scheduled ? ?Follow Up Plan: Telephone follow up appointment with care management team member scheduled for: 07/17/2021 at 3:45 PM ? ?  ? ? ?EWallace Cullens PharmD, BCACP, CPP ?Clinical Pharmacist ?[facility]?[place]?[phone]? ? ? ?

## 2021-06-21 DIAGNOSIS — C20 Malignant neoplasm of rectum: Secondary | ICD-10-CM | POA: Diagnosis not present

## 2021-06-21 DIAGNOSIS — C7800 Secondary malignant neoplasm of unspecified lung: Secondary | ICD-10-CM | POA: Diagnosis not present

## 2021-06-21 DIAGNOSIS — C787 Secondary malignant neoplasm of liver and intrahepatic bile duct: Secondary | ICD-10-CM | POA: Diagnosis not present

## 2021-06-21 DIAGNOSIS — R590 Localized enlarged lymph nodes: Secondary | ICD-10-CM | POA: Diagnosis not present

## 2021-06-21 DIAGNOSIS — Z79899 Other long term (current) drug therapy: Secondary | ICD-10-CM | POA: Diagnosis not present

## 2021-06-21 DIAGNOSIS — R591 Generalized enlarged lymph nodes: Secondary | ICD-10-CM | POA: Diagnosis not present

## 2021-06-21 DIAGNOSIS — C772 Secondary and unspecified malignant neoplasm of intra-abdominal lymph nodes: Secondary | ICD-10-CM | POA: Diagnosis not present

## 2021-06-21 DIAGNOSIS — C785 Secondary malignant neoplasm of large intestine and rectum: Secondary | ICD-10-CM | POA: Diagnosis not present

## 2021-06-22 DIAGNOSIS — Z7984 Long term (current) use of oral hypoglycemic drugs: Secondary | ICD-10-CM | POA: Diagnosis not present

## 2021-06-22 DIAGNOSIS — Z79899 Other long term (current) drug therapy: Secondary | ICD-10-CM | POA: Diagnosis not present

## 2021-06-22 DIAGNOSIS — Z8719 Personal history of other diseases of the digestive system: Secondary | ICD-10-CM | POA: Diagnosis not present

## 2021-06-22 DIAGNOSIS — Z7982 Long term (current) use of aspirin: Secondary | ICD-10-CM | POA: Diagnosis not present

## 2021-06-22 DIAGNOSIS — C787 Secondary malignant neoplasm of liver and intrahepatic bile duct: Secondary | ICD-10-CM | POA: Diagnosis not present

## 2021-06-22 DIAGNOSIS — F17210 Nicotine dependence, cigarettes, uncomplicated: Secondary | ICD-10-CM | POA: Diagnosis not present

## 2021-06-22 DIAGNOSIS — C20 Malignant neoplasm of rectum: Secondary | ICD-10-CM | POA: Diagnosis not present

## 2021-06-22 DIAGNOSIS — M79602 Pain in left arm: Secondary | ICD-10-CM | POA: Diagnosis not present

## 2021-06-22 DIAGNOSIS — N1330 Unspecified hydronephrosis: Secondary | ICD-10-CM | POA: Diagnosis not present

## 2021-06-22 DIAGNOSIS — Z5111 Encounter for antineoplastic chemotherapy: Secondary | ICD-10-CM | POA: Diagnosis not present

## 2021-06-27 NOTE — Chronic Care Management (AMB) (Signed)
?  Care Management  ? ?Note ? ?06/27/2021 ?Name: Anthony Bell MRN: [id number] DOB: 1955/09/24 ? ?Anthony Bell is a 66 y.o. year old male who is a primary care patient of Garnette Gunner, [name], NP and is actively engaged with the care management team. I reached out to Montez Morita by phone today to assist with re-scheduling a follow up visit with the RN Case Manager ? ?Follow up plan: ?Unsuccessful telephone outreach attempt made. A HIPAA compliant phone message was left for the patient providing contact information and requesting a return call.  ?The care management team will reach out to the patient again over the next 7 days.  ?If patient returns call to provider office, please advise to call [place]  at [phone] ? ?Noreene Larsson, RMA ?Care Guide, Embedded Care Coordination ?[facility] [employer]  Care Management  ?[place], [place] [address] ?Direct Dial: [phone] ?[email or web address] ?Website: [email or web address]  ? ?

## 2021-06-28 DIAGNOSIS — N1330 Unspecified hydronephrosis: Secondary | ICD-10-CM | POA: Diagnosis not present

## 2021-06-30 NOTE — Telephone Encounter (Signed)
3rd unsuccessful outreach  

## 2021-06-30 NOTE — Chronic Care Management (AMB) (Signed)
?  Care Management  ? ?Note ? ?06/30/2021 ?Name: Fran Mcree MRN: [id number] DOB: 11/24/1955 ? ?Vashawn Ekstein is a 66 y.o. year old male who is a primary care patient of Garnette Gunner, [name], NP and is actively engaged with the care management team. I reached out to Montez Morita by phone today to assist with re-scheduling a follow up visit with the RN Case Manager ? ?Follow up plan: ?Unable to make contact on outreach attempts x 3. PCP [name], NP notified via routed documentation in medical record.  ? ?Noreene Larsson, RMA ?Care Guide, Embedded Care Coordination ?[place]  Care Management  ?[place], [place] [address] ?Direct Dial: [phone] ?Museum/[email or web address] ?Website: [email or web address]  ? ?

## 2021-07-06 ENCOUNTER — Ambulatory Visit: Payer: Self-pay

## 2021-07-06 DIAGNOSIS — I10 Essential (primary) hypertension: Secondary | ICD-10-CM

## 2021-07-06 DIAGNOSIS — E782 Mixed hyperlipidemia: Secondary | ICD-10-CM

## 2021-07-06 DIAGNOSIS — M79602 Pain in left arm: Secondary | ICD-10-CM

## 2021-07-06 DIAGNOSIS — E785 Hyperlipidemia, unspecified: Secondary | ICD-10-CM

## 2021-07-06 DIAGNOSIS — E1165 Type 2 diabetes mellitus with hyperglycemia: Secondary | ICD-10-CM

## 2021-07-06 DIAGNOSIS — Z85048 Personal history of other malignant neoplasm of rectum, rectosigmoid junction, and anus: Secondary | ICD-10-CM

## 2021-07-06 NOTE — Chronic Care Management (AMB) (Signed)
? Care Management ?  ? RN Visit Note ? ?07/06/2021 ?Name: Anthony Bell MRN: [id number] DOB: 04/11/1955 ? ?Subjective: ?Anthony Bell is a 66 y.o. year old male who is a primary care patient of Anthony Fenton, NP. The care management team was consulted for assistance with disease management and care coordination needs.   ? ?Engaged with patient by telephone for follow up visit in response to provider referral for case management and/or care coordination services.  ? ?Consent to Services:  ? Anthony Bell was given information about Care Management services today including:  ?Care Management services includes personalized support from designated clinical staff supervised by his physician, including individualized plan of care and coordination with other care providers ?24/7 contact phone numbers for assistance for urgent and routine care needs. ?The patient may stop case management services at any time by phone call to the office staff. ? ?Patient agreed to services and consent obtained.  ? ?Assessment: Review of patient past medical history, allergies, medications, health status, including review of consultants reports, laboratory and other test data, was performed as part of comprehensive evaluation and provision of chronic care management services.  ? ?SDOH (Social Determinants of Health) assessments and interventions performed:   ? ?Care Plan ? ?No Known Allergies ? ?Outpatient Encounter Medications as of 07/06/2021  ?Medication Sig  ? amLODipine (NORVASC) 5 MG tablet TAKE 1 TABLET (5 MG TOTAL) BY MOUTH DAILY.  ? aspirin EC 81 MG tablet Take 81 mg by mouth.   ? atorvastatin (LIPITOR) 20 MG tablet Take 1 tablet (20 mg total) by mouth daily.  ? blood glucose meter kit and supplies KIT Dispense based on patient and insurance preference. Use up to four times daily as directed. 100 strips and lancets please  ? Blood Glucose Monitoring Suppl (CONTOUR NEXT EZ) w/Device KIT USE UP TO FOUR TIMES DAILY AS DIRECTED. (FOR ICD-10  E10.9, E11.9).  ? CONTOUR NEXT TEST test strip USE AS DIRECTED UPTO 4 TIMES A DAY  ? empagliflozin (JARDIANCE) 10 MG TABS tablet Take 1 tablet (10 mg total) by mouth daily before breakfast.  ? hydrochlorothiazide (HYDRODIURIL) 25 MG tablet Take 1 tablet (25 mg total) by mouth daily.  ? insulin degludec (TRESIBA) 100 UNIT/ML FlexTouch Pen Inject 20 Units into the skin daily.  ? Insulin Pen Needle 32G X 4 MM MISC 1 Units by Does not apply route every morning. Pen needles  ? lisinopril (ZESTRIL) 20 MG tablet Take 1 tablet (20 mg total) by mouth daily.  ? metFORMIN (GLUCOPHAGE) 1000 MG tablet TAKE 1 TABLET (1,000 MG TOTAL) BY MOUTH 2 (TWO) TIMES DAILY WITH A MEAL. (Patient taking differently: Take 500 mg by mouth 2 (two) times daily with a meal.)  ? ondansetron (ZOFRAN-ODT) 4 MG disintegrating tablet Take 1 tablet (4 mg total) by mouth every 8 (eight) hours as needed for nausea or vomiting. (Patient not taking: Reported on 06/19/2021)  ? tadalafil (CIALIS) 5 MG tablet Take 1 tablet (5 mg total) by mouth daily as needed for erectile dysfunction.  ? ?No facility-administered encounter medications on file as of 07/06/2021.  ? ? ?Patient Active Problem List  ? Diagnosis Date Noted  ? Overweight with body mass index (BMI) of 29 to 29.9 in adult 09/13/2020  ? COPD (chronic obstructive pulmonary disease) ([place]) 06/14/2020  ? Hyperlipidemia 03/23/2020  ? Erectile dysfunction 03/22/2020  ? Type 2 diabetes mellitus ([place]) 08/23/2017  ? Gastroesophageal reflux disease 12/07/2014  ? Essential hypertension 12/07/2014  ? History of rectal cancer 12/07/2014  ? ? ?  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 ?Care Plan : RNCM: General Plan of Care (Adult) for Chronic Disease Management and Care Coordination Needs  ?Updates made by Anthony Ingles, RN since 07/06/2021 12:00 AM  ?  ? ?Problem: RNCM: Development of plan of Care for  Chronic Disease Managment and Care Coordination Needs (HTN/HLD/DM)   ?Priority: High  ?  ? 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 ( March 2023) ? ?RNCM Clinical Goal(s):  ?Patient will verbalize understanding of plan for management of HTN, HLD, and DMII, left arm pain  ?verbalize basic understanding of HTN, HLD, and DMII, left arm pain  disease process and self health management plan  ?take all medications exactly as prescribed and will call provider for medication related questions ?demonstrate understanding of rationale for each prescribed medication  ?attend all scheduled medical appointments: the patient has no upcoming appointments with the pcp but knows to call for changes  ?demonstrate improved and ongoing adherence to prescribed treatment plan for HTN, HLD, and DMII, left arm pain as evidenced by daily monitoring and recording of CBG  adherence to ADA/ carb modified diet adherence to prescribed medication regimen contacting provider for new or worsened symptoms or questions  ?demonstrate improved and ongoing health management independence  ?work with pharmacist to address Medication procurement related to HTN, HLD, and DMII. Left arm pain ?demonstrate ongoing self health care management ability for effective management of chronic conditions  through collaboration with RN Care manager, provider, and care team.  ?Patient will have effective treatment of recurrent cancer: recurrent rectal cancer with  metastasis to lymph nodes and a ureteral obstruction requiring a nephrostomy tube. Stage IV ? ?Interventions: ?1:1 collaboration with primary care provider regarding development and update of comprehensive plan of care as evidenced by provider attestation and co-signature ?Inter-disciplinary care team collaboration (see longitudinal plan of care) ?Evaluation of current treatment plan related to  self management and patient's adherence to plan as established by provider ? ? ?SDOH Barriers (Status: Goal on track: YES.)  ?Patient interviewed and SDOH assessment performed ?       ?SDOH Interventions   ? 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 No structured activity noted.]  ?Stress Interventions Intervention Not Indicated  ?Social Connections Interventions Other (Comment)  [Good support system]  ?Transportation Interventions Intervention Not Indicated  ? ?  ?Patient interviewed and appropriate assessments performed ?Provided patient with information about resources available for help with expressed needs in [facility] and the availability of Care guides as  needed  ?Advised patient to call the office for changes in SDOH, Questions, or concerns ? ? ? ?Diabetes:  (Status: Goal on track: YES.) ?Lab Results  ?  Component Value Date  ? HGBA1C 6.8 (H) 03/30/2021  ?Assessed patient's understanding of A1c goal: <7%. 07-06-2021: The patient knows the goal of A1C is <7.0 ?Provided education to patient about basic DM disease process; ?Reviewed medications with patient and discussed importance of medication adherence. 01-12-2021: The patient states he has [place] and it is working well for him. He  states he is still taking 20 units daily. The patient has not decreased the [place] any. Has parameters to decrease Tresibia 2 units every 7 days for blood sugars <95. The patient states he has not had

## 2021-07-06 NOTE — Patient Instructions (Signed)
Visit Information ? ?Thank you for taking time to visit with me today. Please don't hesitate to contact me if I can be of assistance to you before our next scheduled telephone appointment. ? ?Following are the goals we discussed today:  ?RNCM Clinical Goal(s):  ?Patient will verbalize understanding of plan for management of HTN, HLD, and DMII, left arm pain  ?verbalize basic understanding of HTN, HLD, and DMII, left arm pain  disease process and self health management plan  ?take all medications exactly as prescribed and will call provider for medication related questions ?demonstrate understanding of rationale for each prescribed medication  ?attend all scheduled medical appointments: the patient has no upcoming appointments with the pcp but knows to call for changes  ?demonstrate improved and ongoing adherence to prescribed treatment plan for HTN, HLD, and DMII, left arm pain as evidenced by daily monitoring and recording of CBG  adherence to ADA/ carb modified diet adherence to prescribed medication regimen contacting provider for new or worsened symptoms or questions  ?demonstrate improved and ongoing health management independence  ?work with pharmacist to address Medication procurement related to HTN, HLD, and DMII. Left arm pain ?demonstrate ongoing self health care management ability for effective management of chronic conditions  through collaboration with RN Care manager, provider, and care team.  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 ?  ?Interventions: ?1:1 collaboration with primary care provider regarding development and update of comprehensive plan of care as evidenced by provider attestation and co-signature ?Inter-disciplinary care team collaboration (see longitudinal plan of care) ?Evaluation of current treatment plan related to  self management and patient's  adherence to plan as established by provider ?  ?  ?SDOH Barriers (Status: Goal on track: YES.)  ?Patient interviewed and SDOH assessment performed ?       ?SDOH Interventions   ?  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 No structured activity noted.]  ?Stress Interventions Intervention Not Indicated  ?Social Connections Interventions Other (Comment)  [Good support system]  ?Transportation Interventions Intervention Not Indicated  ?  ?   ?Patient interviewed and appropriate assessments performed ?Provided patient with information about resources available for help with expressed needs in [facility] and the availability of Care guides as  needed  ?Advised patient to call the office for changes in SDOH, Questions, or concerns ?  ?  ?  ?Diabetes:  (Status: Goal on track: YES.) ?     ?Lab Results  ?Component Value Date  ?  HGBA1C 6.8 (H) 03/30/2021  ?Assessed patient's understanding of A1c goal: <7%. 07-06-2021: The patient knows the goal of A1C is <7.0 ?Provided education to patient about basic DM disease process; ?Reviewed medications with patient and discussed importance of medication adherence. 01-12-2021: The patient states he has the [place] and it is working well for him. He states he is still taking 20 units daily. The patient has not decreased the [place] any. Has parameters to decrease Tresibia 2 units every 7 days for blood sugars <95. The patient states he has not had any lows and he is going to keep the dose at 20 units right now. Continues to work  with the pharm D for ongoing support and management of medications.  07-06-2021:  The patient has his medications and is taking as  prescribed. Works on a regular basis with the pharm D. Has recently had a change in his metformin dose from 1000 mg bid to 500 mg bid       ?Reviewed prescribed diet with patient heart healthy/ADA diet. 07-06-2021: Is compliant with heart healthy diet; ?Counseled on importance of regular laboratory monitoring as prescribed. 07-06-2021: Has labs on a regular basis        ?Discussed plans with patient for ongoing care management follow up and provided patient with direct contact information for care management team;      ?Provided patient with written educational materials related to hypo and hyperglycemia and importance of correct treatment. 07-06-2021:  The patient denies any lows at this time. Review of sx and sx of hypoglycemia and hyperglycemia. Will continue to monitor for changes        ?Reviewed scheduled/upcoming provider appointments including: 09-2021 with pcp, is seeing oncologist on a regular basis, knows to call the office for changes ;         ?Advised patient, providing education and rationale, to check cbg BID and record. 03-23-2021: The patient states that his blood sugars are 96 to 98 range and staying this consistently. 07-06-2021: Denies any acute fluctuations in his blood sugars is also being followed closely by the pharm D.        ?call provider for findings outside established parameters. 01-12-2021: States that his blood sugar range has been 118 to 120; 03-23-2021: Range has been 96 to 98 consistently      ?Referral made to pharmacy team for assistance with ongoing support and education related to medications for management of chronic conditions;       ?Review of patient status, including review of consultants reports, relevant laboratory and other test results, and medications completed;       ?Screening for signs and symptoms of depression related to chronic disease state;        ?Assessed social determinant of health barriers;        ?  ?Hyperlipidemia:  (Status: Goal on track: YES.) ?     ?Lab  Results  ?Component Value Date  ?  CHOL 123 03/30/2021  ?  CHOL 127 03/30/2021  ?  HDL 33 (L) 03/30/2021  ?  HDL 36 (L) 03/30/2021  ?  [place] 68 03/30/2021  ?  [place] 70 03/30/2021  ?  TRIG 135 03/30/2021  ?  TRIG 130 03/30/2021  ?  CHOLHDL 3.7 03/30/2021  ?  CHOLHDL 3.5 03/30/2021  ?  ?  ?Medication review performed; medication list updated in electronic medical record. 07-06-2021: Takes Lipitor 20 mg QD ?Provider established cholesterol goals reviewed. 07-06-2021: The patients cholesterol levels are at goal  ?Counseled on importance of regular laboratory monitoring as prescribed. 07-06-2021: The patient has regular lab work done. Denies any acute findings  ?Provided HLD educational materials; ?Reviewed role and benefits of statin for ASCVD risk reduction; ?Discussed strategies to manage statin-induced myalgias; ?Reviewed importance of limiting foods high in cholesterol. 07-06-2021: The patient states despite not being able to eat red meats and other foods he likes he is doing well with his dietary intake. No issues related to dietary restrictions.  ?  ?Hypertension: (Status: Goal on track: YES.) ?Last practice recorded BP readings:  ?   ?BP Readings from Last 3 Encounters:  ?05/08/21 121/78  ?03/30/21 (!) 131/58  ?10/17/20 Marland Kitchen)  120/40  ?Most recent eGFR/CrCl:  ?     ?Lab Results  ?Component Value Date  ?  EGFR 61 03/30/2021  ?  No components found for: CRCL ?  ?Evaluation of current treatment plan related to hypertension self management and patient's adherence to plan as established by provider.07-06-2021: The patient denies any issues with HTN or heart health. Feels he is overall doing well;   ?Provided education to patient re: stroke prevention, s/s of heart attack and stroke; ?Reviewed prescribed diet Heart healthy/ADA diet Heart Healthy/ADA ?Reviewed medications with patient and discussed importance of compliance. 07-06-2021: The patient states he is compliant with medications  ?Counseled on adverse effects of  illicit drug and excessive alcohol use in patients with high blood pressure;  ?Discussed plans with patient for ongoing care management follow up and provided patient with direct contact information for

## 2021-07-07 DIAGNOSIS — C772 Secondary and unspecified malignant neoplasm of intra-abdominal lymph nodes: Secondary | ICD-10-CM | POA: Diagnosis not present

## 2021-07-07 DIAGNOSIS — C787 Secondary malignant neoplasm of liver and intrahepatic bile duct: Secondary | ICD-10-CM | POA: Diagnosis not present

## 2021-07-07 DIAGNOSIS — K862 Cyst of pancreas: Secondary | ICD-10-CM | POA: Diagnosis not present

## 2021-07-07 DIAGNOSIS — F17210 Nicotine dependence, cigarettes, uncomplicated: Secondary | ICD-10-CM | POA: Diagnosis not present

## 2021-07-07 DIAGNOSIS — Z8719 Personal history of other diseases of the digestive system: Secondary | ICD-10-CM | POA: Diagnosis not present

## 2021-07-07 DIAGNOSIS — C20 Malignant neoplasm of rectum: Secondary | ICD-10-CM | POA: Diagnosis not present

## 2021-07-07 DIAGNOSIS — N1330 Unspecified hydronephrosis: Secondary | ICD-10-CM | POA: Diagnosis not present

## 2021-07-07 DIAGNOSIS — Z7984 Long term (current) use of oral hypoglycemic drugs: Secondary | ICD-10-CM | POA: Diagnosis not present

## 2021-07-07 DIAGNOSIS — K621 Rectal polyp: Secondary | ICD-10-CM | POA: Diagnosis not present

## 2021-07-07 DIAGNOSIS — Z79899 Other long term (current) drug therapy: Secondary | ICD-10-CM | POA: Diagnosis not present

## 2021-07-07 DIAGNOSIS — M79602 Pain in left arm: Secondary | ICD-10-CM | POA: Diagnosis not present

## 2021-07-07 DIAGNOSIS — Z7982 Long term (current) use of aspirin: Secondary | ICD-10-CM | POA: Diagnosis not present

## 2021-07-07 DIAGNOSIS — C7800 Secondary malignant neoplasm of unspecified lung: Secondary | ICD-10-CM | POA: Diagnosis not present

## 2021-07-07 DIAGNOSIS — Z5111 Encounter for antineoplastic chemotherapy: Secondary | ICD-10-CM | POA: Diagnosis not present

## 2021-07-09 ENCOUNTER — Other Ambulatory Visit: Payer: Self-pay | Admitting: Internal Medicine

## 2021-07-09 DIAGNOSIS — I10 Essential (primary) hypertension: Secondary | ICD-10-CM

## 2021-07-10 DIAGNOSIS — C20 Malignant neoplasm of rectum: Secondary | ICD-10-CM | POA: Diagnosis not present

## 2021-07-10 NOTE — Telephone Encounter (Signed)
Requested Prescriptions  ?Pending Prescriptions Disp Refills  ?? amLODipine (NORVASC) 5 MG tablet [Pharmacy Med Name: AMLODIPINE BESYLATE 5 MG TAB] 90 tablet 0  ?  Sig: TAKE 1 TABLET (5 MG TOTAL) BY MOUTH DAILY.  ?  ? Cardiovascular: Calcium Channel Blockers 2 Passed - 07/09/2021  1:30 AM  ?  ?  Passed - Last BP in normal range  ?  BP Readings from Last 1 Encounters:  ?05/08/21 121/78  ?   ?  ?  Passed - Last Heart Rate in normal range  ?  Pulse Readings from Last 1 Encounters:  ?05/08/21 66  ?   ?  ?  Passed - Valid encounter within last 6 months  ?  Recent Outpatient Visits   ?      ? 3 months ago Encounter for general adult medical examination with abnormal findings  ? [facility] [place], [place] W, NP  ? 10 months ago Type 2 diabetes mellitus with hyperglycemia, without long-term current use of insulin ([place])  ? [facility] [place], [place] W, NP  ? 12 months ago Depression, unspecified depression type  ? [facility] [name], NP  ? 1 year ago Type 2 diabetes mellitus with hyperglycemia, without long-term current use of insulin ([place])  ? [facility] [name], NP  ? 1 year ago Type 2 diabetes mellitus with hyperglycemia, without long-term current use of insulin ([place])  ? [facility] [name], NP  ?  ?  ?Future Appointments   ?        ? In 2 months Baity, [name], NP [facility] [place]  ?  ? ?  ?  ?  ? ? ?

## 2021-07-17 ENCOUNTER — Telehealth: Payer: BC Managed Care – PPO

## 2021-07-20 DIAGNOSIS — Z5112 Encounter for antineoplastic immunotherapy: Secondary | ICD-10-CM | POA: Diagnosis not present

## 2021-07-20 DIAGNOSIS — Z5111 Encounter for antineoplastic chemotherapy: Secondary | ICD-10-CM | POA: Diagnosis not present

## 2021-07-20 DIAGNOSIS — C20 Malignant neoplasm of rectum: Secondary | ICD-10-CM | POA: Diagnosis not present

## 2021-07-24 ENCOUNTER — Ambulatory Visit: Payer: BC Managed Care – PPO | Admitting: Pharmacist

## 2021-07-24 DIAGNOSIS — E1165 Type 2 diabetes mellitus with hyperglycemia: Secondary | ICD-10-CM

## 2021-07-24 NOTE — Patient Instructions (Signed)
Thank you allowing the Care Management Team to be a part of your care! It was a pleasure speaking with you today! ?  ?  ?Care Management Team  ?  ?Noreene Larsson RN, MSN, CCM ?Nurse Care Coordinator  ?[phone] ?  ?Wallace Cullens, PharmD  ?Clinical Pharmacist  ?[phone] ?  ?Christa See, MSW, LCSW ?Clinical Social Worker ?[phone] ? ? ?Visit Information ? ? Goals Addressed   ? ?  ?  ?  ?  ? This Visit's Progress  ?  Pharmacy Goals     ?  Our goal A1c is less than 7%. This corresponds with fasting sugars less than 130 and 2 hour after meal sugars less than 180. Please check your fasting blood sugar and keep a log of the results ? ? ?Feel free to call me with any questions or concerns. I look forward to our next call! ? ? ?Wallace Cullens, PharmD, BCACP ?Clinical Pharmacist ?[facility] ?[place] ?[phone] ?  ? ?  ? ? ?The patient verbalized understanding of instructions, educational materials, and care plan provided today and declined offer to receive copy of patient instructions, educational materials, and care plan.  ? ?Telephone follow up appointment with care management team member scheduled for: 6/28 at 2 pm ?

## 2021-07-24 NOTE — Chronic Care Management (AMB) (Signed)
?Care Management  ? ?Pharmacy Note ? ?07/24/2021 ?Name: Anthony Bell MRN: [id number] DOB: Sep 04, 1955 ? ?Subjective: ?Anthony Bell is a 66 y.o. year old male who is a primary care patient of [name], NP. The Care Management team was consulted for assistance with care management and care coordination needs.   ? ?Engaged with patient by telephone for follow up visit in response to provider referral for pharmacy case management and/or care coordination services.  ? ?The patient was given information about Care Management services today including:  ?Care Management services includes personalized support from designated clinical staff supervised by the patient's primary care provider, including individualized plan of care and coordination with other care providers. ?24/7 contact phone numbers for assistance for urgent and routine care needs. ?The patient may stop case management services at any time by phone call to the office staff. ? ?Patient agreed to services and consent obtained. ? ?Assessment:  Review of patient status, including review of consultants reports, laboratory and other test data, was performed as part of comprehensive evaluation and provision of chronic care management services.  ? ?SDOH (Social Determinants of Health) assessments and interventions performed:   ? ?Objective: ? ?Lab Results  ?Component Value Date  ? CREATININE 1.54 (H) 05/08/2021  ? CREATININE 1.32 03/30/2021  ? CREATININE 1.26 (H) 09/20/2020  ? ? ?Lab Results  ?Component Value Date  ? HGBA1C 6.8 (H) 03/30/2021  ? ? ?   ?Component Value Date/Time  ? CHOL 123 03/30/2021 1550  ? CHOL 127 03/30/2021 1550  ? TRIG 135 03/30/2021 1550  ? TRIG 130 03/30/2021 1550  ? HDL 33 (L) 03/30/2021 1550  ? HDL 36 (L) 03/30/2021 1550  ? CHOLHDL 3.7 03/30/2021 1550  ? CHOLHDL 3.5 03/30/2021 1550  ? [place] 68 03/30/2021 1550  ? [place] 70 03/30/2021 1550  ? ? ? ?BP Readings from Last 3 Encounters:  ?05/08/21 121/78  ?03/30/21 (!) 131/58  ?10/17/20  (!) 120/40  ? ? ?Care Plan ? ?No Known Allergies ? ?Medications Reviewed Today   ? ? Reviewed by Vanita Ingles, RN (Case Manager) on 07/06/21 at 71  Med List Status: <None>  ? ?Medication Order Taking? Sig Documenting Provider Last Dose Status Informant  ?amLODipine (NORVASC) 5 MG tablet [id number] No TAKE 1 TABLET (5 MG TOTAL) BY MOUTH DAILY. [name], NP Taking Active   ?aspirin EC 81 MG tablet [id number] No Take 81 mg by mouth.  [provider] Taking Active Self  ?atorvastatin (LIPITOR) 20 MG tablet [id number] No Take 1 tablet (20 mg total) by mouth daily. [name], NP Taking Active   ?blood glucose meter kit and supplies KIT [id number] No Dispense based on patient and insurance preference. Use up to four times daily as directed. 100 strips and lancets please [name], NP Taking Active   ?Blood Glucose Monitoring Suppl (CONTOUR NEXT EZ) w/Device KIT [id number] No USE UP TO FOUR TIMES DAILY AS DIRECTED. (FOR ICD-10 E10.9, E11.9). [name], DO Taking Active   ?CONTOUR NEXT TEST test strip [id number] No USE AS DIRECTED UPTO 4 TIMES A DAY [name], NP Taking Active   ?empagliflozin (JARDIANCE) 10 MG TABS tablet [id number] No Take 1 tablet (10 mg total) by mouth daily before breakfast. [name], NP Taking Active   ?hydrochlorothiazide (HYDRODIURIL) 25 MG tablet [id number] No Take 1 tablet (25 mg total) by mouth daily. [name], NP Taking Active   ?insulin degludec (TRESIBA) 100 UNIT/ML FlexTouch Pen [id number] No Inject 20  Units into the skin daily. [name], NP Taking Active   ?Insulin Pen Needle 32G X 4 MM MISC [id number] No 1 Units by Does not apply route every morning. Pen needles [name], NP Taking Active   ?lisinopril (ZESTRIL) 20 MG tablet [id number] No Take 1 tablet (20 mg total) by mouth daily. [name], NP Taking Active   ?metFORMIN (GLUCOPHAGE) 1000 MG tablet [id number] No TAKE 1 TABLET (1,000 MG TOTAL) BY MOUTH 2 (TWO) TIMES  DAILY WITH A MEAL.  ?Patient taking differently: Take 500 mg by mouth 2 (two) times daily with a meal.  ? [name], NP Taking Active   ?ondansetron (ZOFRAN-ODT) 4 MG disintegrating tablet [id number] No Take 1 tablet (4 mg total) by mouth every 8 (eight) hours as needed for nausea or vomiting.  ?Patient not taking: Reported on 06/19/2021  ? [name], [place], MD Not Taking Active   ?tadalafil (CIALIS) 5 MG tablet [id number] No Take 1 tablet (5 mg total) by mouth daily as needed for erectile dysfunction. Malfi, Lupita Raider, FNP Taking Active   ? ?  ?  ? ?  ? ? ?Care Plan : PharmD Medication Management  ?Updates made by [name], RPH-CPP since 07/24/2021 12:00 AM  ?  ? ?Problem: Disease Progression   ?  ? ?Long-Range Goal: Disease Progression Prevented or Minimized   ?Start Date: 06/06/2020  ?Expected End Date: 09/04/2020  ?Recent Progress: On track  ?Priority: High  ?Note:   ?Current Barriers:  ?Financial barriers ?Using copayment card from manufacturers for [place] ?Lack of blood pressure results for clinical team ? ?Pharmacist Clinical Goal(s):  ?Over the next 90 days, patient will maintain control of blood sugar as evidenced by A1C <7% through collaboration with PharmD and provider.  ? ?Interventions: ?1:1 collaboration with Webb Silversmith NP regarding development and update of comprehensive plan of care as evidenced by provider attestation and co-signature ?Inter-disciplinary care team collaboration (see longitudinal plan of care) ?Perform chart review. Patient seen for Office Visit with [facility] on 4/14 related to metastatic rectal cancer ?Today patient reports he is doing so-so. Reports tolerating chemotherapy, but having difficulty with eating due to side effects. Reports eats small meals throughout the day, but his ability to taste is also limited. Reports working with his [place] team closely ? ?T2DM: ?Controlled; current treatment: ?Metformin 1000 mg - 1/2  tablet (500 mg) twice daily with meals ?Jardiance 10 mg daily before breakfast ?Tresiba 20 units daily ?Denies recently monitoring home blood sugar due to fatigue from chemotherapy ?Reports [place] team is monitoring his blood sugar and has advised him to expect fluctuations in his readings ?Reports will discuss further with Oncology team at next visit on 5/11 ?Denies symptoms of hypoglycemia ?Note patient carries soft peppermint candies with him to use as rapid source of sugar if needed for lows ?Encouraged patient to restart monitoring and contact provider if needed for readings outside established parameters or any symptoms of hypoglycemia ? ?Patient Goals/Self-Care Activities ?Over the next 90 days, patient will:  ?- check glucose, document, and provide at future appointments ?- check blood pressure, document, and provide at future appointments ?- attend medical appointments as scheduled ? ?Follow Up Plan: Telephone follow up appointment with care management team member scheduled for: 6/28 at 2 pm ? ?  ? ? ?Wallace Cullens, PharmD, BCACP, CPP ?Clinical Pharmacist ?[facility] ?[place] ?[phone] ? ? ? ?

## 2021-08-03 DIAGNOSIS — L270 Generalized skin eruption due to drugs and medicaments taken internally: Secondary | ICD-10-CM | POA: Diagnosis not present

## 2021-08-03 DIAGNOSIS — F17210 Nicotine dependence, cigarettes, uncomplicated: Secondary | ICD-10-CM | POA: Diagnosis not present

## 2021-08-03 DIAGNOSIS — Z5111 Encounter for antineoplastic chemotherapy: Secondary | ICD-10-CM | POA: Diagnosis not present

## 2021-08-03 DIAGNOSIS — Z79899 Other long term (current) drug therapy: Secondary | ICD-10-CM | POA: Diagnosis not present

## 2021-08-03 DIAGNOSIS — E876 Hypokalemia: Secondary | ICD-10-CM | POA: Diagnosis not present

## 2021-08-03 DIAGNOSIS — R5383 Other fatigue: Secondary | ICD-10-CM | POA: Diagnosis not present

## 2021-08-03 DIAGNOSIS — C19 Malignant neoplasm of rectosigmoid junction: Secondary | ICD-10-CM | POA: Diagnosis not present

## 2021-08-03 DIAGNOSIS — C787 Secondary malignant neoplasm of liver and intrahepatic bile duct: Secondary | ICD-10-CM | POA: Diagnosis not present

## 2021-08-03 DIAGNOSIS — C772 Secondary and unspecified malignant neoplasm of intra-abdominal lymph nodes: Secondary | ICD-10-CM | POA: Diagnosis not present

## 2021-08-03 DIAGNOSIS — N1330 Unspecified hydronephrosis: Secondary | ICD-10-CM | POA: Diagnosis not present

## 2021-08-03 DIAGNOSIS — M79602 Pain in left arm: Secondary | ICD-10-CM | POA: Diagnosis not present

## 2021-08-03 DIAGNOSIS — R634 Abnormal weight loss: Secondary | ICD-10-CM | POA: Diagnosis not present

## 2021-08-03 DIAGNOSIS — Z5112 Encounter for antineoplastic immunotherapy: Secondary | ICD-10-CM | POA: Diagnosis not present

## 2021-08-03 DIAGNOSIS — C20 Malignant neoplasm of rectum: Secondary | ICD-10-CM | POA: Diagnosis not present

## 2021-08-03 DIAGNOSIS — C7802 Secondary malignant neoplasm of left lung: Secondary | ICD-10-CM | POA: Diagnosis not present

## 2021-08-14 ENCOUNTER — Telehealth: Payer: Self-pay

## 2021-08-14 ENCOUNTER — Telehealth: Payer: BC Managed Care – PPO

## 2021-08-14 NOTE — Telephone Encounter (Signed)
  Care Management   Follow Up Note   08/14/2021 Name: Anthony Bell MRN: [id number] DOB: 10/12/1955   Referred by: [name], NP Reason for referral : Care Coordination (RNCM: Follow up for Chronic Disease Management and Care Coordination Needs )   An unsuccessful telephone outreach was attempted today. The patient was referred to the case management team for assistance with care management and care coordination.   Follow Up Plan: A HIPPA compliant phone message was left for the patient providing contact information and requesting a return call.   Noreene Larsson RN, MSN, [place] Mobile: [phone]

## 2021-08-17 ENCOUNTER — Telehealth: Payer: BC Managed Care – PPO

## 2021-08-17 ENCOUNTER — Ambulatory Visit: Payer: Self-pay

## 2021-08-17 DIAGNOSIS — R634 Abnormal weight loss: Secondary | ICD-10-CM | POA: Diagnosis not present

## 2021-08-17 DIAGNOSIS — F17210 Nicotine dependence, cigarettes, uncomplicated: Secondary | ICD-10-CM | POA: Diagnosis not present

## 2021-08-17 DIAGNOSIS — Z6825 Body mass index (BMI) 25.0-25.9, adult: Secondary | ICD-10-CM | POA: Diagnosis not present

## 2021-08-17 DIAGNOSIS — Z79899 Other long term (current) drug therapy: Secondary | ICD-10-CM | POA: Diagnosis not present

## 2021-08-17 DIAGNOSIS — E785 Hyperlipidemia, unspecified: Secondary | ICD-10-CM

## 2021-08-17 DIAGNOSIS — I959 Hypotension, unspecified: Secondary | ICD-10-CM | POA: Diagnosis not present

## 2021-08-17 DIAGNOSIS — Z5112 Encounter for antineoplastic immunotherapy: Secondary | ICD-10-CM | POA: Diagnosis not present

## 2021-08-17 DIAGNOSIS — Z5111 Encounter for antineoplastic chemotherapy: Secondary | ICD-10-CM | POA: Diagnosis not present

## 2021-08-17 DIAGNOSIS — E1165 Type 2 diabetes mellitus with hyperglycemia: Secondary | ICD-10-CM

## 2021-08-17 DIAGNOSIS — C20 Malignant neoplasm of rectum: Secondary | ICD-10-CM | POA: Diagnosis not present

## 2021-08-17 DIAGNOSIS — Z794 Long term (current) use of insulin: Secondary | ICD-10-CM

## 2021-08-17 DIAGNOSIS — R5383 Other fatigue: Secondary | ICD-10-CM | POA: Diagnosis not present

## 2021-08-17 DIAGNOSIS — M79603 Pain in arm, unspecified: Secondary | ICD-10-CM | POA: Diagnosis not present

## 2021-08-17 DIAGNOSIS — K1231 Oral mucositis (ulcerative) due to antineoplastic therapy: Secondary | ICD-10-CM | POA: Diagnosis not present

## 2021-08-17 DIAGNOSIS — R739 Hyperglycemia, unspecified: Secondary | ICD-10-CM | POA: Diagnosis not present

## 2021-08-17 DIAGNOSIS — C19 Malignant neoplasm of rectosigmoid junction: Secondary | ICD-10-CM | POA: Diagnosis not present

## 2021-08-17 DIAGNOSIS — Z85048 Personal history of other malignant neoplasm of rectum, rectosigmoid junction, and anus: Secondary | ICD-10-CM

## 2021-08-17 DIAGNOSIS — K6289 Other specified diseases of anus and rectum: Secondary | ICD-10-CM

## 2021-08-17 DIAGNOSIS — L270 Generalized skin eruption due to drugs and medicaments taken internally: Secondary | ICD-10-CM | POA: Diagnosis not present

## 2021-08-17 DIAGNOSIS — I10 Essential (primary) hypertension: Secondary | ICD-10-CM

## 2021-08-17 NOTE — Patient Instructions (Signed)
Visit Information  Thank you for taking time to visit with me today. Please don't hesitate to contact me if I can be of assistance to you before our next scheduled telephone appointment.  Following are the goals we discussed today:  Diabetes:  (Status: Goal on track: YES.)      Lab Results  Component Value Date    HGBA1C 6.8 (H) 03/30/2021  Assessed patient's understanding of A1c goal: <7%. 08-17-2021: The patient knows the goal of A1C is <7.0 Provided education to patient about basic DM disease process; Reviewed medications with patient and discussed importance of medication adherence. 01-12-2021: The patient states he has the [place] and it is working well for him. He states he is still taking 20 units daily. The patient has not decreased the [place] any. Has parameters to decrease Tresibia 2 units every 7 days for blood sugars <95. The patient states he has not had any lows and he is going to keep the dose at 20 units right now. Continues to work with the pharm D for ongoing support and management of medications.  07-06-2021:  The patient has his medications and is taking as prescribed. Works on a regular basis with the pharm D. Has recently had a change in his metformin dose from 1000 mg bid to 500 mg bid. 08-17-2021: The patient is taking his medications as directed.       Reviewed prescribed diet with patient heart healthy/ADA diet. 08-17-2021: Is compliant with heart healthy diet. The patient states he has been losing weight and he needs to put on some weight on. The patient has been drinking nutritional drinks. Will provide some Glucernia samples and coupons.; Counseled on importance of regular laboratory monitoring as prescribed. 08-17-2021: Has labs on a regular basis        Discussed plans with patient for ongoing care management follow up and provided patient with direct contact information for care management team;      Provided patient with written educational materials related to hypo and  hyperglycemia and importance of correct treatment. 08-17-2021:  The patient denies any lows at this time. Review of sx and sx of hypoglycemia and hyperglycemia. Will continue to monitor for changes        Reviewed scheduled/upcoming provider appointments including: 09-2021 with pcp, is seeing oncologist on a regular basis, knows to call the office for changes ;         Advised patient, providing education and rationale, to check cbg BID and record. 03-23-2021: The patient states that his blood sugars are 96 to 98 range and staying this consistently. 08-17-2021: Denies any acute fluctuations in his blood sugars is also being followed closely by the pharm D.        call provider for findings outside established parameters. 01-12-2021: States that his blood sugar range has been 118 to 120; 03-23-2021: Range has been 96 to 98 consistently      Referral made to pharmacy team for assistance with ongoing support and education related to medications for management of chronic conditions;       Review of patient status, including review of consultants reports, relevant laboratory and other test results, and medications completed;       Screening for signs and symptoms of depression related to chronic disease state;        Assessed social determinant of health barriers;          Hyperlipidemia:  (Status: Goal on track: YES.)      Lab Results  Component Value Date    CHOL 123 03/30/2021    CHOL 127 03/30/2021    HDL 33 (L) 03/30/2021    HDL 36 (L) 03/30/2021    LDLCALC 68 03/30/2021    LDLCALC 70 03/30/2021    TRIG 135 03/30/2021    TRIG 130 03/30/2021    CHOLHDL 3.7 03/30/2021    CHOLHDL 3.5 03/30/2021      Medication review performed; medication list updated in electronic medical record. 08-17-2021: Takes Lipitor 20 mg QD Provider established cholesterol goals reviewed. 08-17-2021: The patients cholesterol levels are at goal  Counseled on importance of regular laboratory monitoring as prescribed.  08-17-2021: The patient has regular lab work done. Denies any acute findings  Provided HLD educational materials; Reviewed role and benefits of statin for ASCVD risk reduction; Discussed strategies to manage statin-induced myalgias; Reviewed importance of limiting foods high in cholesterol. 07-06-2021: The patient states despite not being able to eat red meats and other foods he likes he is doing well with his dietary intake. No issues related to dietary restrictions.    Hypertension: (Status: Goal on track: YES.) Last practice recorded BP readings:     BP Readings from Last 3 Encounters:  05/08/21 121/78  03/30/21 (!) 131/58  10/17/20 (!) 120/40  Most recent eGFR/CrCl:       Lab Results  Component Value Date    EGFR 61 03/30/2021    No components found for: CRCL   Evaluation of current treatment plan related to hypertension self management and patient's adherence to plan as established by provider.08-17-2021: The patient denies any issues with HTN or heart health. Feels he is overall doing well;   Provided education to patient re: stroke prevention, s/s of heart attack and stroke; Reviewed prescribed diet Heart healthy/ADA diet Heart Healthy/ADA Reviewed medications with patient and discussed importance of compliance. 08-17-2021: The patient states he is compliant with medications  Counseled on adverse effects of illicit drug and excessive alcohol use in patients with high blood pressure;  Discussed plans with patient for ongoing care management follow up and provided patient with direct contact information for care management team; Advised patient, providing education and rationale, to monitor blood pressure daily and record, calling PCP for findings outside established parameters;  Provided education on prescribed diet Heart Healthy/ADA diet. 08-17-2021: Review of heart healthy/ADA diet ;  Discussed complications of poorly controlled blood pressure such as heart disease, stroke, circulatory  complications, vision complications, kidney impairment, sexual dysfunction;    Smoking Cessation: (Status: Condition stable. Not addressed this visit.) Reviewed smoking history:  tobacco abuse of 40 years; currently smoking 1 ppd Previous quit attempts, unsuccessful 0 successful using 0  Reports smoking within 30 minutes of waking up Reports triggers to smoke include: stress relief Reports motivation to quit smoking includes: knows that quitting would be better on his health On a scale of 1-10, reports MOTIVATION to quit is 1 On a scale of 1-10, reports CONFIDENCE in quitting is 1   Evaluation of current treatment plan reviewed. 01-12-2021: The patient states that he smokes 10 to 15 cigarettes daily. He is not interested in smoking cessation at this time. Knows there are available resources when he is ready to quit.  Advised patient to discuss smoking cessation options with provider; Provided contact information for [place] [facility]uit [facility] (1-800-QUIT-NOW); Provided patient with printed smoking cessation educational materials; Discussed plans with patient for ongoing care management follow up and provided patient with direct contact information for care management team;  Pain:  (Status: Goal on Track (progressing): YES.) Long Term Goal  Pain assessment performed. 03-23-2021: The patient denies pain right now but states once he has settled down from work and he has taken his bath and eaten that his left arm starts to hurt and he is only getting about 3 hours a night of sleep. He states that once he gets up and is moving around it is fine. He has tried icy hot and a heating pad and it helps minimally. He states his mother had issues with arthritis and he likely does. He said when he was younger he pulled a lot of tobacco. He was taking OTC pain meds but stopped when he felt like that was not helping either. Is open for recommendations from the provider. 08-17-2021: Denies any pain of discomfort at this  time. Is sleeping better. Will continue to monitor for changes or new needs related to pain.  Medications reviewed. 03-23-2021: Is taking OTC pain medications with minimal relief Reviewed provider established plan for pain management; Discussed importance of adherence to all scheduled medical appointments. 03-23-2021: Assisted patient with securing an appointment for 03-30-2021 at 340 pm with pcp. Reviewed with the patient and the patient verbalized understanding of appointment Counseled on the importance of reporting any/all new or changed pain symptoms or management strategies to pain management provider; Advised patient to report to care team affect of pain on daily activities; Discussed use of relaxation techniques and/or diversional activities to assist with pain reduction (distraction, imagery, relaxation, massage, acupressure, TENS, heat, and cold application; Reviewed with patient prescribed pharmacological and nonpharmacological pain relief strategies; Advised patient to discuss unresolved pain, changes in level or intensity of pain with provider; Screening for signs and symptoms of depression related to chronic disease state;  Assessed social determinant of health barriers;         Oncology:  (New goal.) Long Term Goal  Assessment of understanding of oncology diagnosis: 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 07-06-2021: The patient feels he is doing good and denies any acute findings. He goes to the oncologist tomorrow and states he will know more about the treatment plan then. The patient states that he is more fatigued and has not worked in 2 weeks. He feels overall he is doing well. 08-17-2021: At the time of the call the patient was actually at the [facility] getting an infusion. He has the infusions every 2 weeks. He said tomorrow  and the few days after treatment are the worst for him but he is  tolerating well. The patient states that he has lost weight and he is working on not losing anymore and actually gaining some weight. Education and support given. Will have some samples of Glucerna for him with coupons to pick up at the office.  Assessed patient understanding of cancer diagnosis and recommended treatment plan. 08-17-2021: The patient verbalized understanding of the current plan and states he will see the provider tomorrow for ongoing assessment and treatment options. The patient says he feels he is doing well currently.  Assessed available transportation to appointments and treatments. Has consistent/reliable transportation: Yes Assessed support system. Has consistent/reliable family or other support: Yes Nutrition assessment performed./ 07-06-2021: The patient states he is eating good and is staying away from red meats. 08-17-2021: States that he is losing weight and is working on gaining weight. He is doing what he needs to do to stay focused on the  treatment of his rectal cancer. Education and support given.     Our next appointment is by telephone on 10-05-2021 at 230 pm  Please call the care guide team at [phone] if you need to cancel or reschedule your appointment.   If you are experiencing a Mental Health or [place] or need someone to talk to, please call the Suicide and Crisis Lifeline: 988 call the [place] National Suicide Prevention Lifeline: 458 053 5765 or TTY: (506) 687-9789 TTY 731-612-8600) to talk to a trained counselor call 1-800-273-TALK (toll free, 24 hour hotline)   The patient verbalized understanding of instructions, educational materials, and care plan provided today and DECLINED offer to receive copy of patient instructions, educational materials, and care plan.   Telephone follow up appointment with care management team member scheduled for:10-05-2021 at 42 pm  Noreene Larsson RN, MSN, [place] Mobile: [phone]

## 2021-08-17 NOTE — Chronic Care Management (AMB) (Signed)
Care Management    RN Visit Note  08/17/2021 Name: Anthony Bell MRN: [id number] DOB: November 24, 1955  Subjective: Anthony Bell is a 66 y.o. year old male who is a primary care patient of Anthony Fenton, NP. The care management team was consulted for assistance with disease management and care coordination needs.    Engaged with patient by telephone for follow up visit in response to provider referral for case management and/or care coordination services.   Consent to Services:   Mr. Fariss was given information about Care Management services today including:  Care Management services includes personalized support from designated clinical staff supervised by his physician, including individualized plan of care and coordination with other care providers 24/7 contact phone numbers for assistance for urgent and routine care needs. The patient may stop case management services at any time by phone call to the office staff.  Patient agreed to services and consent obtained.   Assessment: Review of patient past medical history, allergies, medications, health status, including review of consultants reports, laboratory and other test data, was performed as part of comprehensive evaluation and provision of chronic care management services.   SDOH (Social Determinants of Health) assessments and interventions performed:    Care Plan  No Known Allergies  Outpatient Encounter Medications as of 08/17/2021  Medication Sig   amLODipine (NORVASC) 5 MG tablet TAKE 1 TABLET (5 MG TOTAL) BY MOUTH DAILY.   aspirin EC 81 MG tablet Take 81 mg by mouth.    atorvastatin (LIPITOR) 20 MG tablet Take 1 tablet (20 mg total) by mouth daily.   blood glucose meter kit and supplies KIT Dispense based on patient and insurance preference. Use up to four times daily as directed. 100 strips and lancets please   Blood Glucose Monitoring Suppl (CONTOUR NEXT EZ) w/Device KIT USE UP TO FOUR TIMES DAILY AS DIRECTED. (FOR ICD-10  E10.9, E11.9).   CONTOUR NEXT TEST test strip USE AS DIRECTED UPTO 4 TIMES A DAY   empagliflozin (JARDIANCE) 10 MG TABS tablet Take 1 tablet (10 mg total) by mouth daily before breakfast.   hydrochlorothiazide (HYDRODIURIL) 25 MG tablet Take 1 tablet (25 mg total) by mouth daily.   insulin degludec (TRESIBA) 100 UNIT/ML FlexTouch Pen Inject 20 Units into the skin daily.   Insulin Pen Needle 32G X 4 MM MISC 1 Units by Does not apply route every morning. Pen needles   lisinopril (ZESTRIL) 20 MG tablet Take 1 tablet (20 mg total) by mouth daily.   metFORMIN (GLUCOPHAGE) 1000 MG tablet TAKE 1 TABLET (1,000 MG TOTAL) BY MOUTH 2 (TWO) TIMES DAILY WITH A MEAL. (Patient taking differently: Take 500 mg by mouth 2 (two) times daily with a meal.)   ondansetron (ZOFRAN-ODT) 4 MG disintegrating tablet Take 1 tablet (4 mg total) by mouth every 8 (eight) hours as needed for nausea or vomiting. (Patient not taking: Reported on 06/19/2021)   tadalafil (CIALIS) 5 MG tablet Take 1 tablet (5 mg total) by mouth daily as needed for erectile dysfunction.   No facility-administered encounter medications on file as of 08/17/2021.    Patient Active Problem List   Diagnosis Date Noted   Overweight with body mass index (BMI) of 29 to 29.9 in adult 09/13/2020   COPD (chronic obstructive pulmonary disease) ([place]) 06/14/2020   Hyperlipidemia 03/23/2020   Erectile dysfunction 03/22/2020   Type 2 diabetes mellitus ([place]) 08/23/2017   Gastroesophageal reflux disease 12/07/2014   Essential hypertension 12/07/2014   History of rectal cancer 12/07/2014  Conditions to be addressed/monitored: HTN, HLD, DMII, and rectal cancer, actively in treatment, and chronic pain  Care Plan : RNCM: General Plan of Care (Adult) for Chronic Disease Management and Care Coordination Needs  Updates made by Vanita Ingles, RN since 08/17/2021 12:00 AM     Problem: RNCM: Development of plan of Care for Chronic Disease Managment and Care  Coordination Needs (HTN/HLD/DM)   Priority: High     Long-Range Goal: RNCM: Effective Management of plan of Care for Chronic Disease Managment and Care Coordination Needs (HTN/HLD/DM)   Start Date: 01/12/2021  Expected End Date: 01/12/2022  Priority: High  Note:   Current Barriers:  Knowledge Deficits related to plan of care for management of HTN, HLD, and DMII, left arm pain  Chronic Disease Management support and education needs related to HTN, HLD, and DMII, left arm pain 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 ( March 2023)  RNCM Clinical Goal(s):  Patient will verbalize understanding of plan for management of HTN, HLD, and DMII, left arm pain  verbalize basic understanding of HTN, HLD, and DMII, left arm pain  disease process and self health management plan  take all medications exactly as prescribed and will call provider for medication related questions demonstrate understanding of rationale for each prescribed medication  attend all scheduled medical appointments: the patient has no upcoming appointments with the pcp but knows to call for changes  demonstrate improved and ongoing adherence to prescribed treatment plan for HTN, HLD, and DMII, left arm pain as evidenced by daily monitoring and recording of CBG  adherence to ADA/ carb modified diet adherence to prescribed medication regimen contacting provider for new or worsened symptoms or questions  demonstrate improved and ongoing health management independence  work with pharmacist to address Medication procurement related to HTN, HLD, and DMII. Left arm pain demonstrate ongoing self health care management ability for effective management of chronic conditions  through collaboration with RN Care manager, provider, and care team.  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 Stage IV  Interventions: 1:1 collaboration with primary care provider regarding development and update of comprehensive plan of care as evidenced by provider attestation and co-signature Inter-disciplinary care team collaboration (see longitudinal plan of care) Evaluation of current treatment plan related to  self management and patient's adherence to plan as established by provider   SDOH Barriers (Status: Goal on track: YES.)  Patient interviewed and SDOH assessment performed        SDOH Interventions    Flowsheet Row Most Recent Value  SDOH Interventions   Food Insecurity Interventions Intervention Not Indicated  Financial Strain Interventions Intervention Not Indicated  Housing Interventions Intervention Not Indicated  Intimate Partner Violence Interventions Intervention Not Indicated  Physical Activity Interventions Other (Comments)  [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 No structured activity noted.]  Stress Interventions Intervention Not Indicated  Social Connections Interventions Other (Comment)  [Good support system]  Transportation Interventions Intervention Not Indicated     Patient interviewed and appropriate assessments performed Provided patient with information about resources available for help with expressed needs in [place] and the availability of Care guides as  needed  Advised patient to call the office for changes in [place], Questions, or concerns    Diabetes:  (Status: Goal on track: YES.) Lab Results  Component Value Date   HGBA1C  6.8 (H) 03/30/2021  Assessed patient's understanding of A1c goal: <7%. 08-17-2021: The patient knows the goal of A1C is <7.0 Provided education to patient about basic DM disease process; Reviewed medications with patient and discussed importance of medication adherence. 01-12-2021: The patient states he has the [place] and it is working well for him. He states he is still taking 20 units  daily. The patient has not decreased the [place] any. Has parameters to decrease Tresibia 2 units every 7 days for blood sugars <95. The patient states he has not had any lows and he is going to keep the dose at 20 units right now. Continues to work with the pharm D for ongoing support and management of medications.  07-06-2021:  The patient has his medications and is taking as prescribed. Works on a regular basis with the pharm D. Has recently had a change in his metformin dose from 1000 mg bid to 500 mg bid. 08-17-2021: The patient is taking his medications as directed.       Reviewed prescribed diet with patient heart healthy/ADA diet. 08-17-2021: Is compliant with heart healthy diet. The patient states he has been losing weight and he needs to put on some weight on. The patient has been drinking nutritional drinks. Will provide some Glucernia samples and coupons.; Counseled on importance of regular laboratory monitoring as prescribed. 08-17-2021: Has labs on a regular basis        Discussed plans with patient for ongoing care management follow up and provided patient with direct contact information for care management team;      Provided patient with written educational materials related to hypo and hyperglycemia and importance of correct treatment. 08-17-2021:  The patient denies any lows at this time. Review of sx and sx of hypoglycemia and hyperglycemia. Will continue to monitor for changes        Reviewed scheduled/upcoming provider appointments including: 09-2021 with pcp, is seeing oncologist on a regular basis, knows to call the office for changes ;         Advised patient, providing education and rationale, to check cbg BID and record. 03-23-2021: The patient states that his blood sugars are 96 to 98 range and staying this consistently. 08-17-2021: Denies any acute fluctuations in his blood sugars is also being followed closely by the pharm D.        call provider for findings outside established  parameters. 01-12-2021: States that his blood sugar range has been 118 to 120; 03-23-2021: Range has been 96 to 98 consistently      Referral made to pharmacy team for assistance with ongoing support and education related to medications for management of chronic conditions;       Review of patient status, including review of consultants reports, relevant laboratory and other test results, and medications completed;       Screening for signs and symptoms of depression related to chronic disease state;        Assessed social determinant of health barriers;         Hyperlipidemia:  (Status: Goal on track: YES.) Lab Results  Component Value Date   CHOL 123 03/30/2021   CHOL 127 03/30/2021   HDL 33 (L) 03/30/2021   HDL 36 (L) 03/30/2021   LDLCALC 68 03/30/2021   LDLCALC 70 03/30/2021   TRIG 135 03/30/2021   TRIG 130 03/30/2021   CHOLHDL 3.7 03/30/2021   CHOLHDL 3.5 03/30/2021     Medication review performed; medication list updated in electronic medical record.  08-17-2021: Takes Lipitor 20 mg QD Provider established cholesterol goals reviewed. 08-17-2021: The patients cholesterol levels are at goal  Counseled on importance of regular laboratory monitoring as prescribed. 08-17-2021: The patient has regular lab work done. Denies any acute findings  Provided HLD educational materials; Reviewed role and benefits of statin for ASCVD risk reduction; Discussed strategies to manage statin-induced myalgias; Reviewed importance of limiting foods high in cholesterol. 07-06-2021: The patient states despite not being able to eat red meats and other foods he likes he is doing well with his dietary intake. No issues related to dietary restrictions.   Hypertension: (Status: Goal on track: YES.) Last practice recorded BP readings:  BP Readings from Last 3 Encounters:  05/08/21 121/78  03/30/21 (!) 131/58  10/17/20 (!) 120/40  Most recent eGFR/CrCl:  Lab Results  Component Value Date   EGFR 61 03/30/2021     No components found for: CRCL  Evaluation of current treatment plan related to hypertension self management and patient's adherence to plan as established by provider.08-17-2021: The patient denies any issues with HTN or heart health. Feels he is overall doing well;   Provided education to patient re: stroke prevention, s/s of heart attack and stroke; Reviewed prescribed diet Heart healthy/ADA diet Heart Healthy/ADA Reviewed medications with patient and discussed importance of compliance. 08-17-2021: The patient states he is compliant with medications  Counseled on adverse effects of illicit drug and excessive alcohol use in patients with high blood pressure;  Discussed plans with patient for ongoing care management follow up and provided patient with direct contact information for care management team; Advised patient, providing education and rationale, to monitor blood pressure daily and record, calling PCP for findings outside established parameters;  Provided education on prescribed diet Heart Healthy/ADA diet. 08-17-2021: Review of heart healthy/ADA diet ;  Discussed complications of poorly controlled blood pressure such as heart disease, stroke, circulatory complications, vision complications, kidney impairment, sexual dysfunction;   Smoking Cessation: (Status: Condition stable. Not addressed this visit.) Reviewed smoking history:  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 01-12-2021: The patient states that he smokes 10 to 15 cigarettes daily. He is not interested in smoking  cessation at this time. Knows there are available resources when he is ready to quit.  Advised patient to discuss smoking cessation options with provider; Provided contact information for [place] [facility]uit Line (1-800-QUIT-NOW); Provided patient with printed smoking cessation educational materials; Discussed plans with patient for ongoing care management follow up and provided patient with direct contact information for care management team;   Pain:  (Status: Goal on Track (progressing): YES.) Long Term Goal  Pain assessment performed. 03-23-2021: The patient denies pain right now but states once he has settled down from work and he has taken his bath and eaten that his left arm starts to hurt and he is only getting about 3 hours a night of sleep. He states that once he gets up and is moving around it is fine. He has tried icy hot and a heating pad and it helps minimally. He states his mother had issues with arthritis and  he likely does. He said when he was younger he pulled a lot of tobacco. He was taking OTC pain meds but stopped when he felt like that was not helping either. Is open for recommendations from the provider. 08-17-2021: Denies any pain of discomfort at this time. Is sleeping better. Will continue to monitor for changes or new needs related to pain.  Medications reviewed. 03-23-2021: Is taking OTC pain medications with minimal relief Reviewed provider established plan for pain management; Discussed importance of adherence to all scheduled medical appointments. 03-23-2021: Assisted patient with securing an appointment for 03-30-2021 at 340 pm with pcp. Reviewed with the patient and the patient verbalized understanding of appointment Counseled on the importance of reporting any/all new or changed pain symptoms or management strategies to pain management provider; Advised patient to report to care team affect of pain on daily activities; Discussed use of relaxation techniques and/or diversional  activities to assist with pain reduction (distraction, imagery, relaxation, massage, acupressure, TENS, heat, and cold application; Reviewed with patient prescribed pharmacological and nonpharmacological pain relief strategies; Advised patient to discuss unresolved pain, changes in level or intensity of pain with provider; Screening for signs and symptoms of depression related to chronic disease state;  Assessed social determinant of health barriers;      Oncology:  (New goal.) Long Term Goal  Assessment of understanding of oncology diagnosis: 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 07-06-2021: The patient feels he is doing good and denies any acute findings. He goes to the oncologist tomorrow and states he will know more about the treatment plan then. The patient states that he is more fatigued and has not worked in 2 weeks. He feels overall he is doing well. 08-17-2021: At the time of the call the patient was actually at the [facility] getting an infusion. He has the infusions every 2 weeks. He said tomorrow  and the few days after treatment are the worst for him but he is tolerating well. The patient states that he has lost weight and he is working on not losing anymore and actually gaining some weight. Education and support given. Will have some samples of Glucerna for him with coupons to pick up at the office.  Assessed patient understanding of cancer diagnosis and recommended treatment plan. 08-17-2021: The patient verbalized understanding of the current plan and states he will see the provider tomorrow for ongoing assessment and treatment options. The patient says he feels he is doing well currently.  Assessed available transportation to appointments and treatments. Has consistent/reliable transportation: Yes Assessed support system. Has consistent/reliable family or other support: Yes Nutrition assessment  performed./ 07-06-2021: The patient states he is eating good and is staying away from red meats. 08-17-2021: States that he is losing weight and is working on gaining weight. He is doing what he needs to do to stay focused on the treatment of his rectal cancer. Education and support given.   Patient Goals/Self-Care Activities: Patient will self administer medications as prescribed as evidenced by self report/primary caregiver report  Patient will attend all scheduled provider appointments as evidenced by clinician review of documented attendance to scheduled appointments and patient/caregiver report Patient will call pharmacy for medication refills as evidenced by patient report and review of pharmacy fill history as appropriate Patient will attend church or other social activities as evidenced by patient report Patient will continue to perform ADL's independently as evidenced by patient/caregiver report Patient will  continue to perform IADL's independently as evidenced by patient/caregiver report Patient will call provider office for new concerns or questions as evidenced by review of documented incoming telephone call notes and patient report Patient will work with BSW to address care coordination needs and will continue to work with the clinical team to address health care and disease management related needs as evidenced by documented adherence to scheduled care management/care coordination appointments - keep appointment with eye doctor - schedule appointment with eye doctor - check blood sugar at prescribed times: before meals and at bedtime, when you have symptoms of low or high blood sugar, and before and after exercise - check feet daily for cuts, sores or redness - enter blood sugar readings and medication or insulin into daily log - take the blood sugar log to all doctor visits - trim toenails straight across - drink 6 to 8 glasses of water each day - eat fish at least once per week -  fill half of plate with vegetables - limit fast food meals to no more than 1 per week - manage portion size - prepare main meal at home 3 to 5 days each week - read food labels for fat, fiber, carbohydrates and portion size - reduce red meat to 2 to 3 times a week - keep feet up while sitting - wash and dry feet carefully every day - wear comfortable, cotton socks - wear comfortable, well-fitting shoes - check blood pressure weekly - choose a place to take my blood pressure (home, clinic or office, retail store) - learn about high blood pressure - keep a blood pressure log - call doctor for signs and symptoms of high blood pressure - develop an action plan for high blood pressure - keep all doctor appointments - take medications for blood pressure exactly as prescribed - call for medicine refill 2 or 3 days before it runs out - take all medications exactly as prescribed - call doctor with any symptoms you believe are related to your medicine - call doctor when you experience any new symptoms - go to all doctor appointments as scheduled - adhere to prescribed diet: Heart Healthy/ADA        Plan: Telephone follow up appointment with care management team member scheduled for:  10-05-2021 at 230 pm  [place], MSN, [place] Mobile: [phone]

## 2021-08-31 DIAGNOSIS — C7800 Secondary malignant neoplasm of unspecified lung: Secondary | ICD-10-CM | POA: Diagnosis not present

## 2021-08-31 DIAGNOSIS — I959 Hypotension, unspecified: Secondary | ICD-10-CM | POA: Diagnosis not present

## 2021-08-31 DIAGNOSIS — Z5111 Encounter for antineoplastic chemotherapy: Secondary | ICD-10-CM | POA: Diagnosis not present

## 2021-08-31 DIAGNOSIS — C19 Malignant neoplasm of rectosigmoid junction: Secondary | ICD-10-CM | POA: Diagnosis not present

## 2021-08-31 DIAGNOSIS — R634 Abnormal weight loss: Secondary | ICD-10-CM | POA: Diagnosis not present

## 2021-08-31 DIAGNOSIS — C772 Secondary and unspecified malignant neoplasm of intra-abdominal lymph nodes: Secondary | ICD-10-CM | POA: Diagnosis not present

## 2021-08-31 DIAGNOSIS — F17210 Nicotine dependence, cigarettes, uncomplicated: Secondary | ICD-10-CM | POA: Diagnosis not present

## 2021-08-31 DIAGNOSIS — L270 Generalized skin eruption due to drugs and medicaments taken internally: Secondary | ICD-10-CM | POA: Diagnosis not present

## 2021-08-31 DIAGNOSIS — C20 Malignant neoplasm of rectum: Secondary | ICD-10-CM | POA: Diagnosis not present

## 2021-08-31 DIAGNOSIS — C787 Secondary malignant neoplasm of liver and intrahepatic bile duct: Secondary | ICD-10-CM | POA: Diagnosis not present

## 2021-08-31 DIAGNOSIS — Z79899 Other long term (current) drug therapy: Secondary | ICD-10-CM | POA: Diagnosis not present

## 2021-08-31 DIAGNOSIS — R5383 Other fatigue: Secondary | ICD-10-CM | POA: Diagnosis not present

## 2021-08-31 DIAGNOSIS — M79603 Pain in arm, unspecified: Secondary | ICD-10-CM | POA: Diagnosis not present

## 2021-08-31 DIAGNOSIS — E876 Hypokalemia: Secondary | ICD-10-CM | POA: Diagnosis not present

## 2021-09-02 DIAGNOSIS — Z8249 Family history of ischemic heart disease and other diseases of the circulatory system: Secondary | ICD-10-CM | POA: Diagnosis not present

## 2021-09-02 DIAGNOSIS — F17210 Nicotine dependence, cigarettes, uncomplicated: Secondary | ICD-10-CM | POA: Diagnosis not present

## 2021-09-02 DIAGNOSIS — K219 Gastro-esophageal reflux disease without esophagitis: Secondary | ICD-10-CM | POA: Diagnosis not present

## 2021-09-02 DIAGNOSIS — I10 Essential (primary) hypertension: Secondary | ICD-10-CM | POA: Diagnosis not present

## 2021-09-02 DIAGNOSIS — N1330 Unspecified hydronephrosis: Secondary | ICD-10-CM | POA: Diagnosis not present

## 2021-09-02 DIAGNOSIS — K561 Intussusception: Secondary | ICD-10-CM | POA: Diagnosis not present

## 2021-09-02 DIAGNOSIS — N135 Crossing vessel and stricture of ureter without hydronephrosis: Secondary | ICD-10-CM | POA: Diagnosis not present

## 2021-09-02 DIAGNOSIS — N99522 Malfunction of other external stoma of urinary tract: Secondary | ICD-10-CM | POA: Diagnosis not present

## 2021-09-02 DIAGNOSIS — N99528 Other complication of other external stoma of urinary tract: Secondary | ICD-10-CM | POA: Diagnosis not present

## 2021-09-02 DIAGNOSIS — T83022A Displacement of nephrostomy catheter, initial encounter: Secondary | ICD-10-CM | POA: Diagnosis not present

## 2021-09-02 DIAGNOSIS — Y838 Other surgical procedures as the cause of abnormal reaction of the patient, or of later complication, without mention of misadventure at the time of the procedure: Secondary | ICD-10-CM | POA: Diagnosis not present

## 2021-09-02 DIAGNOSIS — Z7984 Long term (current) use of oral hypoglycemic drugs: Secondary | ICD-10-CM | POA: Diagnosis not present

## 2021-09-02 DIAGNOSIS — Z833 Family history of diabetes mellitus: Secondary | ICD-10-CM | POA: Diagnosis not present

## 2021-09-02 DIAGNOSIS — Z794 Long term (current) use of insulin: Secondary | ICD-10-CM | POA: Diagnosis not present

## 2021-09-02 DIAGNOSIS — E119 Type 2 diabetes mellitus without complications: Secondary | ICD-10-CM | POA: Diagnosis not present

## 2021-09-02 DIAGNOSIS — T83092A Other mechanical complication of nephrostomy catheter, initial encounter: Secondary | ICD-10-CM | POA: Diagnosis not present

## 2021-09-02 DIAGNOSIS — G893 Neoplasm related pain (acute) (chronic): Secondary | ICD-10-CM | POA: Diagnosis not present

## 2021-09-02 DIAGNOSIS — N139 Obstructive and reflux uropathy, unspecified: Secondary | ICD-10-CM | POA: Diagnosis not present

## 2021-09-02 DIAGNOSIS — Z7982 Long term (current) use of aspirin: Secondary | ICD-10-CM | POA: Diagnosis not present

## 2021-09-02 DIAGNOSIS — Z79899 Other long term (current) drug therapy: Secondary | ICD-10-CM | POA: Diagnosis not present

## 2021-09-02 DIAGNOSIS — D696 Thrombocytopenia, unspecified: Secondary | ICD-10-CM | POA: Diagnosis not present

## 2021-09-02 DIAGNOSIS — C787 Secondary malignant neoplasm of liver and intrahepatic bile duct: Secondary | ICD-10-CM | POA: Diagnosis not present

## 2021-09-02 DIAGNOSIS — C786 Secondary malignant neoplasm of retroperitoneum and peritoneum: Secondary | ICD-10-CM | POA: Diagnosis not present

## 2021-09-02 DIAGNOSIS — Z85048 Personal history of other malignant neoplasm of rectum, rectosigmoid junction, and anus: Secondary | ICD-10-CM | POA: Diagnosis not present

## 2021-09-03 DIAGNOSIS — C787 Secondary malignant neoplasm of liver and intrahepatic bile duct: Secondary | ICD-10-CM | POA: Diagnosis not present

## 2021-09-03 DIAGNOSIS — N135 Crossing vessel and stricture of ureter without hydronephrosis: Secondary | ICD-10-CM | POA: Insufficient documentation

## 2021-09-03 DIAGNOSIS — T83098A Other mechanical complication of other indwelling urethral catheter, initial encounter: Secondary | ICD-10-CM | POA: Diagnosis not present

## 2021-09-03 DIAGNOSIS — T83022A Displacement of nephrostomy catheter, initial encounter: Secondary | ICD-10-CM | POA: Insufficient documentation

## 2021-09-03 DIAGNOSIS — C20 Malignant neoplasm of rectum: Secondary | ICD-10-CM | POA: Diagnosis not present

## 2021-09-03 DIAGNOSIS — G893 Neoplasm related pain (acute) (chronic): Secondary | ICD-10-CM | POA: Diagnosis not present

## 2021-09-03 DIAGNOSIS — N139 Obstructive and reflux uropathy, unspecified: Secondary | ICD-10-CM | POA: Diagnosis not present

## 2021-09-04 DIAGNOSIS — N1330 Unspecified hydronephrosis: Secondary | ICD-10-CM | POA: Diagnosis not present

## 2021-09-04 DIAGNOSIS — Z436 Encounter for attention to other artificial openings of urinary tract: Secondary | ICD-10-CM | POA: Diagnosis not present

## 2021-09-04 DIAGNOSIS — Y831 Surgical operation with implant of artificial internal device as the cause of abnormal reaction of the patient, or of later complication, without mention of misadventure at the time of the procedure: Secondary | ICD-10-CM | POA: Diagnosis not present

## 2021-09-04 DIAGNOSIS — T83022A Displacement of nephrostomy catheter, initial encounter: Secondary | ICD-10-CM | POA: Diagnosis not present

## 2021-09-12 ENCOUNTER — Other Ambulatory Visit: Payer: Self-pay | Admitting: Internal Medicine

## 2021-09-12 NOTE — Telephone Encounter (Signed)
Requested Prescriptions  Pending Prescriptions Disp Refills  . metFORMIN (GLUCOPHAGE) 1000 MG tablet [Pharmacy Med Name: METFORMIN HCL 1,000 MG TABLET] 180 tablet 0    Sig: TAKE 1 TABLET (1,000 MG TOTAL) BY MOUTH TWICE A DAY WITH A MEAL     Endocrinology:  Diabetes - Biguanides Failed - 09/12/2021  8:30 AM      Failed - Cr in normal range and within 360 days    Creat  Date Value Ref Range Status  03/30/2021 1.32 0.70 - 1.35 mg/dL Final   Creatinine, Ser  Date Value Ref Range Status  05/08/2021 1.54 (H) 0.61 - 1.24 mg/dL Final   Creatinine, Urine  Date Value Ref Range Status  03/30/2021 79 20 - 320 mg/dL Final         Failed - eGFR in normal range and within 360 days    GFR, Est African American  Date Value Ref Range Status  09/20/2020 70 > OR = 60 mL/min/1.49m Final   GFR, Est Non African American  Date Value Ref Range Status  09/20/2020 60 > OR = 60 mL/min/1.742mFinal   GFR, Estimated  Date Value Ref Range Status  05/08/2021 50 (L) >60 mL/min Final    Comment:    (NOTE) Calculated using the CKD-EPI Creatinine Equation (2021)    eGFR  Date Value Ref Range Status  03/30/2021 61 > OR = 60 mL/min/1.7371minal    Comment:    The eGFR is based on the CKD-EPI 2021 equation. To calculate  the new eGFR from a previous Creatinine or Cystatin C result, go to https://www.kidney.org/professionals/ kdoqi/gfr%5Fcalculator          Failed - B12 Level in normal range and within 720 days    No results found for: "VITAMINB12"       Failed - CBC within normal limits and completed in the last 12 months    WBC  Date Value Ref Range Status  05/08/2021 10.7 (H) 4.0 - 10.5 K/uL Final   RBC  Date Value Ref Range Status  05/08/2021 6.31 (H) 4.22 - 5.81 MIL/uL Final   Hemoglobin  Date Value Ref Range Status  05/08/2021 17.0 13.0 - 17.0 g/dL Final   HGB  Date Value Ref Range Status  10/10/2011 14.9 13.0 - 18.0 g/dL Final   HCT  Date Value Ref Range Status  05/08/2021 52.3  (H) 39.0 - 52.0 % Final  10/10/2011 46.9 40.0 - 52.0 % Final   MCHC  Date Value Ref Range Status  05/08/2021 32.5 30.0 - 36.0 g/dL Final   [facility] Value Ref Range Status  05/08/2021 26.9 26.0 - 34.0 pg Final   MCV  Date Value Ref Range Status  05/08/2021 82.9 80.0 - 100.0 fL Final  10/10/2011 87 80 - 100 fL Final   No results found for: "PLTCOUNTKUC", "LABPLAT", "POCPLA" RDW  Date Value Ref Range Status  05/08/2021 15.8 (H) 11.5 - 15.5 % Final  10/10/2011 14.3 11.5 - 14.5 % Final         Passed - HBA1C is between 0 and 7.9 and within 180 days    Hgb A1c MFr Bld  Date Value Ref Range Status  03/30/2021 6.8 (H) <5.7 % of total Hgb Final    Comment:    For someone without known diabetes, a hemoglobin A1c value of 6.5% or greater indicates that they may have  diabetes and this should be confirmed with a follow-up  test. . For someone with known diabetes, a value <  7% indicates  that their diabetes is well controlled and a value  greater than or equal to 7% indicates suboptimal  control. A1c targets should be individualized based on  duration of diabetes, age, comorbid conditions, and  other considerations. . Currently, no consensus exists regarding use of hemoglobin A1c for diagnosis of diabetes for children. Renella Cunas - Valid encounter within last 6 months    Recent Outpatient Visits          5 months ago Encounter for general adult medical examination with abnormal findings   [facility] [place], [name], NP   12 months ago Type 2 diabetes mellitus with hyperglycemia, without long-term current use of insulin [facility])   [facility], [name], NP   1 year ago Depression, unspecified depression type   [facility] [name], NP   1 year ago Type 2 diabetes mellitus with hyperglycemia, without long-term current use of insulin [facility])   [facility] [name], NP   1 year ago Type 2  diabetes mellitus with hyperglycemia, without long-term current use of insulin [facility])   [facility] [name], NP      Future Appointments            In 2 weeks Garnette Gunner, [name], NP [facility], [facility]

## 2021-09-14 DIAGNOSIS — C772 Secondary and unspecified malignant neoplasm of intra-abdominal lymph nodes: Secondary | ICD-10-CM | POA: Diagnosis not present

## 2021-09-14 DIAGNOSIS — C787 Secondary malignant neoplasm of liver and intrahepatic bile duct: Secondary | ICD-10-CM | POA: Diagnosis not present

## 2021-09-14 DIAGNOSIS — C20 Malignant neoplasm of rectum: Secondary | ICD-10-CM | POA: Diagnosis not present

## 2021-09-14 DIAGNOSIS — Z5111 Encounter for antineoplastic chemotherapy: Secondary | ICD-10-CM | POA: Diagnosis not present

## 2021-09-14 DIAGNOSIS — L853 Xerosis cutis: Secondary | ICD-10-CM | POA: Diagnosis not present

## 2021-09-14 DIAGNOSIS — E876 Hypokalemia: Secondary | ICD-10-CM | POA: Diagnosis not present

## 2021-09-14 DIAGNOSIS — I959 Hypotension, unspecified: Secondary | ICD-10-CM | POA: Diagnosis not present

## 2021-09-14 DIAGNOSIS — F17210 Nicotine dependence, cigarettes, uncomplicated: Secondary | ICD-10-CM | POA: Diagnosis not present

## 2021-09-14 DIAGNOSIS — C19 Malignant neoplasm of rectosigmoid junction: Secondary | ICD-10-CM | POA: Diagnosis not present

## 2021-09-14 DIAGNOSIS — Z79899 Other long term (current) drug therapy: Secondary | ICD-10-CM | POA: Diagnosis not present

## 2021-09-20 ENCOUNTER — Ambulatory Visit: Payer: BC Managed Care – PPO | Admitting: Pharmacist

## 2021-09-20 DIAGNOSIS — E1165 Type 2 diabetes mellitus with hyperglycemia: Secondary | ICD-10-CM

## 2021-09-20 DIAGNOSIS — I10 Essential (primary) hypertension: Secondary | ICD-10-CM

## 2021-09-20 NOTE — Patient Instructions (Signed)
Thank you allowing the Care Management Team to be a part of your care! It was a pleasure speaking with you today!     Care Management Team    Noreene Larsson RN, MSN, CCM Nurse Care Coordinator  [phone]   Wallace Cullens, PharmD  Clinical Pharmacist  [phone]   Christa See, MSW, LCSW Clinical Social Worker [phone]   Visit Information   Goals Addressed             This Visit's Progress    Pharmacy Goals       Our goal A1c is less than 7%. This corresponds with fasting sugars less than 130 and 2 hour after meal sugars less than 180. Please check your fasting blood sugar and keep a log of the results  Feel free to call me with any questions or concerns. I look forward to our next call!   Wallace Cullens, PharmD, [place] [phone]        The patient verbalized understanding of instructions, educational materials, and care plan provided today and DECLINED offer to receive copy of patient instructions, educational materials, and care plan.   Telephone follow up appointment with care management team member scheduled for: 9/27 at 2:30pm

## 2021-09-20 NOTE — Chronic Care Management (AMB) (Signed)
Care Management   Pharmacy Note  09/20/2021 Name: Anthony Bell MRN: [id number] DOB: 19-Feb-1956  Subjective: Anthony Bell is a 66 y.o. year old male who is a primary care patient of Anthony Fenton, NP. The Care Management team was consulted for assistance with care management and care coordination needs.    Engaged with patient by telephone for follow up visit in response to provider referral for pharmacy case management and/or care coordination services.   The patient was given information about Care Management services today including:  Care Management services includes personalized support from designated clinical staff supervised by the patient's primary care provider, including individualized plan of care and coordination with other care providers. 24/7 contact phone numbers for assistance for urgent and routine care needs. The patient may stop case management services at any time by phone call to the office staff.  Patient agreed to services and consent obtained.  Assessment:  Review of patient status, including review of consultants reports, laboratory and other test data, was performed as part of comprehensive evaluation and provision of chronic care management services.   SDOH (Social Determinants of Health) assessments and interventions performed:    Objective:  Lab Results  Component Value Date   CREATININE 1.54 (H) 05/08/2021   CREATININE 1.32 03/30/2021   CREATININE 1.26 (H) 09/20/2020    Lab Results  Component Value Date   HGBA1C 6.8 (H) 03/30/2021    BP Readings from Last 3 Encounters:  05/08/21 121/78  03/30/21 (!) 131/58  10/17/20 (!) 120/40   Pulse Readings from Last 3 Encounters:  05/08/21 66  03/30/21 74  09/13/20 69     Care Plan  No Known Allergies  Medications Reviewed Today     Reviewed by [name], RPH-CPP (Pharmacist) on 09/20/21 at 1407  Med List Status: <None>   Medication Order Taking? Sig Documenting Provider Last  Dose Status Informant  amLODipine (NORVASC) 5 MG tablet [id number] Yes TAKE 1 TABLET (5 MG TOTAL) BY MOUTH DAILY. Anthony Fenton, NP Taking Active   aspirin EC 81 MG tablet [id number] Yes Take 81 mg by mouth.  [provider] Taking Active Self  atorvastatin (LIPITOR) 20 MG tablet [id number] Yes Take 1 tablet (20 mg total) by mouth daily. Anthony Fenton, NP Taking Active   blood glucose meter kit and supplies KIT [id number]  Dispense based on patient and insurance preference. Use up to four times daily as directed. 100 strips and lancets please Anthony Haddock, NP  Active   Blood Glucose Monitoring Suppl (CONTOUR NEXT EZ) w/Device KIT [id number]  USE UP TO FOUR TIMES DAILY AS DIRECTED. (FOR ICD-10 E10.9, E11.9). [name], DO  Active   CONTOUR NEXT TEST test strip [id number]  USE AS DIRECTED UPTO 4 TIMES A Anthony Binning, NP  Active   dexamethasone (DECADRON) 4 MG tablet [id number]  Take 18m (2 x 460mtablets) by mouth in the morning for 2 days after oxaliplatin chemotherapy, then as directed. [provider]  Active   empagliflozin (JARDIANCE) 10 MG TABS tablet [id number] Take 1 tablet (10 mg total) by mouth daily before breakfast. Anthony FentonNP Taking Active   insulin degludec (TRESIBA) 100 UNIT/ML FlexTouch Pen [id number] Inject 20 Units into the skin daily. Anthony FentonNP Taking Active   Insulin Pen Needle 32G X 4 MM MISC [id number] Units by Does not apply route every morning. Pen needles WiKathrine HaddockNP  Active   metFORMIN (GLUCOPHAGE) 1000 MG  tablet [id number] Yes TAKE 1 TABLET (1,000 MG TOTAL) BY MOUTH TWICE A DAY WITH A MEAL  Patient taking differently: Take 500 mg by mouth 2 (two) times daily with a meal.   Ba[place], [name], NP Taking Active   ondansetron (ZOFRAN-ODT) 4 MG disintegrating tablet [id number]  Take 1 tablet (4 mg total) by mouth every 8 (eight) hours as needed for nausea or vomiting. Anthony Bell, [place], MD  Active   tadalafil  (CIALIS) 5 MG tablet [id number]  Take 1 tablet (5 mg total) by mouth daily as needed for erectile dysfunction. [name], FNP  Active             Patient Active Problem List   Diagnosis Date Noted   Overweight with body mass index (BMI) of 29 to 29.9 in adult 09/13/2020   COPD (chronic obstructive pulmonary disease) ([place]) 06/14/2020   Hyperlipidemia 03/23/2020   Erectile dysfunction 03/22/2020   Type 2 diabetes mellitus ([place]) 08/23/2017   Gastroesophageal reflux disease 12/07/2014   Essential hypertension 12/07/2014   History of rectal cancer 12/07/2014    Care Plan : PharmD Medication Management  Updates made by [name], RPH-CPP since 09/20/2021 12:00 AM     Problem: Disease Progression      Long-Range Goal: Disease Progression Prevented or Minimized   Start Date: 06/06/2020  Expected End Date: 09/04/2020  Recent Progress: On track  Priority: High  Note:   Current Barriers:  Financial barriers Using copayment card from manufacturers for [place] Lack of blood pressure results for clinical team  Pharmacist Clinical Goal(s):  Over the next 90 days, patient will maintain control of blood sugar as evidenced by A1C <7% through collaboration with PharmD and provider.   Interventions: 1:1 collaboration with Webb Silversmith NP regarding development and update of comprehensive plan of care as evidenced by provider attestation and co-signature Inter-disciplinary care team collaboration (see longitudinal plan of care) Perform chart review. Patient seen for Office Visit with [facility] on 6/22 Note provider advised patient: Stop HCTZ and lisinopril. Only on amlodipine now Identify appointment conflict as next appointment with Oncology team scheduled at same time as next appointment with PCP Patient states he will call office today to reschedule PCP appointment  Hypertension: Current treatment: amlodipine 5 mg daily Confirms stopped  taking HCTZ and lisinopril as directed by Oncologist Recalls recent home readings ranging: 120/60-70  T2DM: Controlled; current treatment: Metformin 1000 mg - 1/2 tablet (500 mg) twice daily with meals Jardiance 10 mg daily before breakfast Tresiba 20 units daily Reports recent home morning blood sugar readings ranging: 96-125 Have counseled on monitoring for symptoms of hypoglycemia and how to manage lows Note patient carries soft peppermint candies with him to use as rapid source of sugar if needed for lows Encouraged patient to monitor and contact provider if needed for readings outside established parameters or any symptoms of hypoglycemia  Patient Goals/Self-Care Activities Over the next 90 days, patient will:  - check glucose, document, and provide at future appointments - check blood pressure, document, and provide at future appointments - attend medical appointments as scheduled  Follow Up Plan: Telephone follow up appointment with care management team member scheduled for: 9/27 at 2:30pm      Wallace Cullens, PharmD, [facility] [phone]

## 2021-09-25 DIAGNOSIS — C787 Secondary malignant neoplasm of liver and intrahepatic bile duct: Secondary | ICD-10-CM | POA: Diagnosis not present

## 2021-09-25 DIAGNOSIS — C20 Malignant neoplasm of rectum: Secondary | ICD-10-CM | POA: Diagnosis not present

## 2021-09-25 DIAGNOSIS — R590 Localized enlarged lymph nodes: Secondary | ICD-10-CM | POA: Diagnosis not present

## 2021-09-28 ENCOUNTER — Ambulatory Visit: Payer: BLUE CROSS/BLUE SHIELD | Admitting: Internal Medicine

## 2021-09-28 DIAGNOSIS — T451X5A Adverse effect of antineoplastic and immunosuppressive drugs, initial encounter: Secondary | ICD-10-CM | POA: Diagnosis not present

## 2021-09-28 DIAGNOSIS — L270 Generalized skin eruption due to drugs and medicaments taken internally: Secondary | ICD-10-CM | POA: Diagnosis not present

## 2021-09-28 DIAGNOSIS — Z79899 Other long term (current) drug therapy: Secondary | ICD-10-CM | POA: Diagnosis not present

## 2021-09-28 DIAGNOSIS — Z5111 Encounter for antineoplastic chemotherapy: Secondary | ICD-10-CM | POA: Diagnosis not present

## 2021-09-28 DIAGNOSIS — Z5112 Encounter for antineoplastic immunotherapy: Secondary | ICD-10-CM | POA: Diagnosis not present

## 2021-09-28 DIAGNOSIS — C20 Malignant neoplasm of rectum: Secondary | ICD-10-CM | POA: Diagnosis not present

## 2021-09-28 DIAGNOSIS — E876 Hypokalemia: Secondary | ICD-10-CM | POA: Diagnosis not present

## 2021-09-28 DIAGNOSIS — C7800 Secondary malignant neoplasm of unspecified lung: Secondary | ICD-10-CM | POA: Diagnosis not present

## 2021-09-28 DIAGNOSIS — I10 Essential (primary) hypertension: Secondary | ICD-10-CM | POA: Diagnosis not present

## 2021-09-28 DIAGNOSIS — C787 Secondary malignant neoplasm of liver and intrahepatic bile duct: Secondary | ICD-10-CM | POA: Diagnosis not present

## 2021-10-04 ENCOUNTER — Telehealth: Payer: BC Managed Care – PPO

## 2021-10-05 ENCOUNTER — Telehealth: Payer: BC Managed Care – PPO

## 2021-10-05 ENCOUNTER — Telehealth: Payer: Self-pay

## 2021-10-05 NOTE — Telephone Encounter (Signed)
  Care Management   Follow Up Note   10/05/2021 Name: Anthony Bell MRN: [id number] DOB: 20-Dec-1955   Referred by: [name], NP Reason for referral : Care Coordination (RNCM: Follow up for Chronic Disease Management and Care Coordination Needs)   An unsuccessful telephone outreach was attempted today. The patient was referred to the case management team for assistance with care management and care coordination.   Follow Up Plan: A HIPPA compliant phone message was left for the patient providing contact information and requesting a return call.   Noreene Larsson RN, MSN, [place] Mobile: [phone]

## 2021-10-12 ENCOUNTER — Ambulatory Visit: Payer: BLUE CROSS/BLUE SHIELD | Admitting: Internal Medicine

## 2021-10-12 DIAGNOSIS — I10 Essential (primary) hypertension: Secondary | ICD-10-CM | POA: Diagnosis not present

## 2021-10-12 DIAGNOSIS — R739 Hyperglycemia, unspecified: Secondary | ICD-10-CM | POA: Diagnosis not present

## 2021-10-12 DIAGNOSIS — L270 Generalized skin eruption due to drugs and medicaments taken internally: Secondary | ICD-10-CM | POA: Diagnosis not present

## 2021-10-12 DIAGNOSIS — Z79899 Other long term (current) drug therapy: Secondary | ICD-10-CM | POA: Diagnosis not present

## 2021-10-12 DIAGNOSIS — G629 Polyneuropathy, unspecified: Secondary | ICD-10-CM | POA: Diagnosis not present

## 2021-10-12 DIAGNOSIS — C7800 Secondary malignant neoplasm of unspecified lung: Secondary | ICD-10-CM | POA: Diagnosis not present

## 2021-10-12 DIAGNOSIS — Z5112 Encounter for antineoplastic immunotherapy: Secondary | ICD-10-CM | POA: Diagnosis not present

## 2021-10-12 DIAGNOSIS — C787 Secondary malignant neoplasm of liver and intrahepatic bile duct: Secondary | ICD-10-CM | POA: Diagnosis not present

## 2021-10-12 DIAGNOSIS — C20 Malignant neoplasm of rectum: Secondary | ICD-10-CM | POA: Diagnosis not present

## 2021-10-12 DIAGNOSIS — C19 Malignant neoplasm of rectosigmoid junction: Secondary | ICD-10-CM | POA: Diagnosis not present

## 2021-10-12 DIAGNOSIS — F17210 Nicotine dependence, cigarettes, uncomplicated: Secondary | ICD-10-CM | POA: Diagnosis not present

## 2021-10-12 DIAGNOSIS — E876 Hypokalemia: Secondary | ICD-10-CM | POA: Diagnosis not present

## 2021-10-17 ENCOUNTER — Other Ambulatory Visit: Payer: Self-pay | Admitting: Internal Medicine

## 2021-10-17 DIAGNOSIS — I10 Essential (primary) hypertension: Secondary | ICD-10-CM

## 2021-10-18 NOTE — Telephone Encounter (Signed)
Requested Prescriptions  Pending Prescriptions Disp Refills  . amLODipine (NORVASC) 5 MG tablet [Pharmacy Med Name: AMLODIPINE BESYLATE 5 MG TAB] 90 tablet 0    Sig: TAKE 1 TABLET (5 MG TOTAL) BY MOUTH DAILY.     Cardiovascular: Calcium Channel Blockers 2 Failed - 10/17/2021  2:18 AM      Failed - Valid encounter within last 6 months    Recent Outpatient Visits          6 months ago Encounter for general adult medical examination with abnormal findings   [facility] [place], [name], NP   1 year ago Type 2 diabetes mellitus with hyperglycemia, without long-term current use of insulin [facility])   [facility], [name], NP   1 year ago Depression, unspecified depression type   [facility] [name], NP   1 year ago Type 2 diabetes mellitus with hyperglycemia, without long-term current use of insulin [facility])   [facility] [name], NP   1 year ago Type 2 diabetes mellitus with hyperglycemia, without long-term current use of insulin [facility])   [facility] [name], NP      Future Appointments            In 1 week Garnette Gunner, [name], NP [place] BP in normal range    BP Readings from Last 1 Encounters:  05/08/21 121/78         Passed - Last Heart Rate in normal range    Pulse Readings from Last 1 Encounters:  05/08/21 66

## 2021-10-26 DIAGNOSIS — E876 Hypokalemia: Secondary | ICD-10-CM | POA: Diagnosis not present

## 2021-10-26 DIAGNOSIS — F17210 Nicotine dependence, cigarettes, uncomplicated: Secondary | ICD-10-CM | POA: Diagnosis not present

## 2021-10-26 DIAGNOSIS — I10 Essential (primary) hypertension: Secondary | ICD-10-CM | POA: Diagnosis not present

## 2021-10-26 DIAGNOSIS — L270 Generalized skin eruption due to drugs and medicaments taken internally: Secondary | ICD-10-CM | POA: Diagnosis not present

## 2021-10-26 DIAGNOSIS — C19 Malignant neoplasm of rectosigmoid junction: Secondary | ICD-10-CM | POA: Diagnosis not present

## 2021-10-26 DIAGNOSIS — Z5112 Encounter for antineoplastic immunotherapy: Secondary | ICD-10-CM | POA: Diagnosis not present

## 2021-10-26 DIAGNOSIS — C787 Secondary malignant neoplasm of liver and intrahepatic bile duct: Secondary | ICD-10-CM | POA: Diagnosis not present

## 2021-10-26 DIAGNOSIS — Z5111 Encounter for antineoplastic chemotherapy: Secondary | ICD-10-CM | POA: Diagnosis not present

## 2021-10-26 DIAGNOSIS — Z79899 Other long term (current) drug therapy: Secondary | ICD-10-CM | POA: Diagnosis not present

## 2021-10-26 DIAGNOSIS — C7800 Secondary malignant neoplasm of unspecified lung: Secondary | ICD-10-CM | POA: Diagnosis not present

## 2021-10-27 ENCOUNTER — Ambulatory Visit: Payer: BLUE CROSS/BLUE SHIELD | Admitting: Internal Medicine

## 2021-10-27 DIAGNOSIS — I700 Atherosclerosis of aorta: Secondary | ICD-10-CM | POA: Insufficient documentation

## 2021-10-27 NOTE — Progress Notes (Deleted)
Subjective:    Patient ID: Montez Morita, male    DOB: 29-Nov-1955, 66 y.o.   MRN: [id number]  HPI  Patient presents to clinic today for 84-monthfollow-up of chronic conditions.  HTN: He BP today is.  He is taking Amlodipine as prescribed.  ECG from 04/2021 reviewed.  HLD with Aortic Atherosclerosis: His last LDL was 68, triglycerides 135, 03/2021.  He denies myalgias on Atorvastatin.  He is taking Aspirin as well.  He does not consume a low-fat diet.  DM2: His last A1c was 9.5%, 08/2021.  He is taking J[place]as prescribed.  His sugars range.  He checks his feet routinely.  His last eye exam was.  Flu never.  Pneumovax 07/2017.  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  GERD: Triggered by spicy foods.  He takes Tums as needed with good relief of symptoms.  Upper GI from 08/2014 reviewed.  COPD: He denies cough or shortness of breath.  He uses Albuterol as needed with good relief of symptoms.  There are no PFTs on file.  He does continue to smoke.  History of Rectal Cancer: Status post surgery.  He did not have chemo or radiation.  ED: He is not currently taking any medications for this.  He does not follow with urology.  CKD 3: His last creatinine was 1.54, GFR 50, 04/2021.  He is not on an ACEI/ARB for renal protection.  He does not follow with nephrology.  Review of Systems     Past Medical History:  Diagnosis Date   Colon polyps    Diabetes mellitus without complication (HCC)    GERD (gastroesophageal reflux disease)    Hypertension    Rectal cancer (HCC)    [facility]    Current Outpatient Medications  Medication Sig Dispense Refill   amLODipine (NORVASC) 5 MG tablet TAKE 1 TABLET (5 MG TOTAL) BY MOUTH DAILY. 90 tablet 0   aspirin EC 81 MG tablet Take 81 mg by mouth.      atorvastatin (LIPITOR) 20 MG tablet Take 1 tablet (20 mg total) by mouth daily. 90 tablet 1   blood glucose meter kit and supplies KIT Dispense based on patient and insurance preference.  Use up to four times daily as directed. 100 strips and lancets please 1 each 0   Blood Glucose Monitoring Suppl (CONTOUR NEXT EZ) w/Device KIT USE UP TO FOUR TIMES DAILY AS DIRECTED. (FOR ICD-10 E10.9, E11.9). 1 kit 0   CONTOUR NEXT TEST test strip USE AS DIRECTED UPTO 4 TIMES A DAY 100 strip 5   dexamethasone (DECADRON) 4 MG tablet Take 887m(2 x 62m108mablets) by mouth in the morning for 2 days after oxaliplatin chemotherapy, then as directed.     empagliflozin (JARDIANCE) 10 MG TABS tablet Take 1 tablet (10 mg total) by mouth daily before breakfast. 90 tablet 1   insulin degludec (TRESIBA) 100 UNIT/ML FlexTouch Pen Inject 20 Units into the skin daily. 15 mL 1   Insulin Pen Needle 32G X 4 MM MISC 1 Units by Does not apply route every morning. Pen needles 90 each 3   metFORMIN (GLUCOPHAGE) 1000 MG tablet TAKE 1 TABLET (1,000 MG TOTAL) BY MOUTH TWICE A DAY WITH A MEAL (Patient taking differently: Take 500 mg by mouth 2 (two) times daily with a meal.) 180 tablet 0   ondansetron (ZOFRAN-ODT) 4 MG disintegrating tablet Take 1 tablet (4 mg total) by mouth every 8 (eight) hours as needed for nausea or vomiting.  20 tablet 0   tadalafil (CIALIS) 5 MG tablet Take 1 tablet (5 mg total) by mouth daily as needed for erectile dysfunction. 10 tablet 0   No current facility-administered medications for this visit.    No Known Allergies  Family History  Problem Relation Age of Onset   Diabetes Mother    Cancer Father    Diabetes Father    Diabetes Sister    Heart attack Neg Hx    Stroke Neg Hx     Social History   Socioeconomic History   Marital status: Married    Spouse name: Not on file   Number of children: Not on file   Years of education: Not on file   Highest education level: Not on file  Occupational History   Not on file  Tobacco Use   Smoking status: Every Day    Packs/day: 1.00    Years: 40.00    Total pack years: 40.00    Types: Cigarettes   Smokeless tobacco: Never  Vaping Use    Vaping Use: Never used  Substance and Sexual Activity   Alcohol use: No   Drug use: No   Sexual activity: Not on file  Other Topics Concern   Not on file  Social History Narrative   Not on file   Social Determinants of Health   Financial Resource Strain: Low Risk  (01/12/2021)   Overall Financial Resource Strain (CARDIA)    Difficulty of Paying Living Expenses: Not hard at all  Food Insecurity: No Food Insecurity (01/12/2021)   Hunger Vital Sign    Worried About Running Out of Food in the Last Year: Never true    [place] in the Last Year: Never true  Transportation Needs: No Transportation Needs (01/12/2021)   PRAPARE - Hydrologist (Medical): No    Lack of Transportation (Non-Medical): No  Physical Activity: Inactive (01/12/2021)   Exercise Vital Sign    Days of Exercise per Week: 0 days    Minutes of Exercise per Session: 0 min  Stress: No Stress Concern Present (01/12/2021)   [place]    Feeling of Stress : Not at all  Social Connections: Moderately Isolated (01/12/2021)   Social Connection and Isolation Panel [NHANES]    Frequency of Communication with Friends and Family: More than three times a week    Frequency of Social Gatherings with Friends and Family: More than three times a week    Attends Religious Services: Never    Marine scientist or Organizations: No    Attends Archivist Meetings: Never    Marital Status: Married  Human resources officer Violence: Not At Risk (01/12/2021)   Humiliation, Afraid, Rape, and Kick questionnaire    Fear of Current or Ex-Partner: No    Emotionally Abused: No    Physically Abused: No    Sexually Abused: No     Constitutional: Denies fever, malaise, fatigue, headache or abrupt weight changes.  HEENT: Denies eye pain, eye redness, ear pain, ringing in the ears, wax buildup, runny nose, nasal congestion, bloody nose,  or sore throat. Respiratory: Denies difficulty breathing, shortness of breath, cough or sputum production.   Cardiovascular: Denies chest pain, chest tightness, palpitations or swelling in the hands or feet.  Gastrointestinal: Denies abdominal pain, bloating, constipation, diarrhea or blood in the stool.  GU: Patient reports erectile dysfunction.  Denies urgency, frequency, pain with urination, burning sensation,  blood in urine, odor or discharge. Musculoskeletal: Denies decrease in range of motion, difficulty with gait, muscle pain or joint pain and swelling.  Skin: Denies redness, rashes, lesions or ulcercations.  Neurological: Denies dizziness, difficulty with memory, difficulty with speech or problems with balance and coordination.  Psych: Denies anxiety, depression, SI/HI.  No other specific complaints in a complete review of systems (except as listed in HPI above).  Objective:   Physical Exam  There were no vitals taken for this visit. Wt Readings from Last 3 Encounters:  05/08/21 197 lb (89.4 kg)  03/30/21 193 lb 9.6 oz (87.8 kg)  09/13/20 194 lb (88 kg)    General: Appears their stated age, well developed, well nourished in NAD. Skin: Warm, dry and intact. No rashes, lesions or ulcerations noted. HEENT: Head: normal shape and size; Eyes: sclera white, no icterus, conjunctiva pink, PERRLA and EOMs intact; Ears: Tm's gray and intact, normal light reflex; Nose: mucosa pink and moist, septum midline; Throat/Mouth: Teeth present, mucosa pink and moist, no exudate, lesions or ulcerations noted.  Neck:  Neck supple, trachea midline. No masses, lumps or thyromegaly present.  Cardiovascular: Normal rate and rhythm. S1,S2 noted.  No murmur, rubs or gallops noted. No JVD or BLE edema. No carotid bruits noted. Pulmonary/Chest: Normal effort and positive vesicular breath sounds. No respiratory distress. No wheezes, rales or ronchi noted.  Abdomen: Soft and nontender. Normal bowel sounds. No  distention or masses noted. Liver, spleen and kidneys non palpable. Musculoskeletal: Normal range of motion. No signs of joint swelling. No difficulty with gait.  Neurological: Alert and oriented. Cranial nerves II-XII grossly intact. Coordination normal.  Psychiatric: Mood and affect normal. Behavior is normal. Judgment and thought content normal.    BMET    Component Value Date/Time   NA 137 05/08/2021 0110   NA 144 10/10/2011 1615   K 3.0 (L) 05/08/2021 0110   K 4.3 10/10/2011 1615   CL 99 05/08/2021 0110   CL 104 10/10/2011 1615   CO2 28 05/08/2021 0110   CO2 35 (H) 10/10/2011 1615   GLUCOSE 142 (H) 05/08/2021 0110   GLUCOSE 106 (H) 10/10/2011 1615   BUN 30 (H) 05/08/2021 0110   BUN 17 10/10/2011 1615   CREATININE 1.54 (H) 05/08/2021 0110   CREATININE 1.32 03/30/2021 1550   CALCIUM 9.0 05/08/2021 0110   CALCIUM 9.2 10/10/2011 1615   GFRNONAA 50 (L) 05/08/2021 0110   GFRNONAA 60 09/20/2020 0750   GFRAA 70 09/20/2020 0750    Lipid Panel     Component Value Date/Time   CHOL 123 03/30/2021 1550   CHOL 127 03/30/2021 1550   TRIG 135 03/30/2021 1550   TRIG 130 03/30/2021 1550   HDL 33 (L) 03/30/2021 1550   HDL 36 (L) 03/30/2021 1550   CHOLHDL 3.7 03/30/2021 1550   CHOLHDL 3.5 03/30/2021 1550   LDLCALC 68 03/30/2021 1550   LDLCALC 70 03/30/2021 1550    CBC    Component Value Date/Time   WBC 10.7 (H) 05/08/2021 0110   RBC 6.31 (H) 05/08/2021 0110   HGB 17.0 05/08/2021 0110   HGB 14.9 10/10/2011 1615   HCT 52.3 (H) 05/08/2021 0110   HCT 46.9 10/10/2011 1615   PLT 201 05/08/2021 0110   PLT 159 10/10/2011 1615   MCV 82.9 05/08/2021 0110   MCV 87 10/10/2011 1615   MCH 26.9 05/08/2021 0110   MCHC 32.5 05/08/2021 0110   RDW 15.8 (H) 05/08/2021 0110   RDW 14.3 10/10/2011 1615  LYMPHSABS 1,448 06/02/2020 0958   EOSABS 193 06/02/2020 0958   BASOSABS 31 06/02/2020 0958    Hgb A1C Lab Results  Component Value Date   HGBA1C 6.8 (H) 03/30/2021             Assessment & Plan:    RTC in 6 months, annual exam Webb Silversmith, NP

## 2021-10-29 ENCOUNTER — Other Ambulatory Visit: Payer: Self-pay | Admitting: Internal Medicine

## 2021-10-29 DIAGNOSIS — I10 Essential (primary) hypertension: Secondary | ICD-10-CM

## 2021-10-30 NOTE — Telephone Encounter (Signed)
Requested medication (s) are due for refill today: yes  Requested medication (s) are on the active medication list: yes  Last refill:  08/03/21  Future visit scheduled: no  Notes to clinic:  rx was dc'd on 09/20/21 per pt confirming Oncology had dc'd. Per Last OV with oncology pt suppose to be taking lisinopril. Please advise      Requested Prescriptions  Pending Prescriptions Disp Refills   hydrochlorothiazide (HYDRODIURIL) 25 MG tablet [Pharmacy Med Name: HYDROCHLOROTHIAZIDE 25 MG TAB] 90 tablet 1    Sig: Take 1 tablet (25 mg total) by mouth daily.     Cardiovascular: Diuretics - Thiazide Failed - 10/29/2021  1:18 AM      Failed - Cr in normal range and within 180 days    Creat  Date Value Ref Range Status  03/30/2021 1.32 0.70 - 1.35 mg/dL Final   Creatinine, Ser  Date Value Ref Range Status  05/08/2021 1.54 (H) 0.61 - 1.24 mg/dL Final   Creatinine, Urine  Date Value Ref Range Status  03/30/2021 79 20 - 320 mg/dL Final         Failed - K in normal range and within 180 days    Potassium  Date Value Ref Range Status  05/08/2021 3.0 (L) 3.5 - 5.1 mmol/L Final  10/10/2011 4.3 3.5 - 5.1 mmol/L Final         Failed - Valid encounter within last 6 months    Recent Outpatient Visits           7 months ago Encounter for general adult medical examination with abnormal findings   [facility] [place], [name], NP   1 year ago Type 2 diabetes mellitus with hyperglycemia, without long-term current use of insulin ([place])   [facility] [place], [name], NP   1 year ago Depression, unspecified depression type   [facility] [name], NP   1 year ago Type 2 diabetes mellitus with hyperglycemia, without long-term current use of insulin ([place])   [facility] [name], NP   1 year ago Type 2 diabetes mellitus with hyperglycemia, without long-term current use of insulin ([place])   [facility] [name], NP              Passed - Na in normal range and within 180 days    Sodium  Date Value Ref Range Status  05/08/2021 137 135 - 145 mmol/L Final  10/10/2011 144 136 - 145 mmol/L Final         Passed - Last BP in normal range    BP Readings from Last 1 Encounters:  05/08/21 121/78          lisinopril (ZESTRIL) 20 MG tablet [Pharmacy Med Name: LISINOPRIL 20 MG TABLET] 90 tablet 1    Sig: TAKE 1 TABLET BY MOUTH EVERY DAY     Cardiovascular:  ACE Inhibitors Failed - 10/29/2021  1:18 AM      Failed - Cr in normal range and within 180 days    Creat  Date Value Ref Range Status  03/30/2021 1.32 0.70 - 1.35 mg/dL Final   Creatinine, Ser  Date Value Ref Range Status  05/08/2021 1.54 (H) 0.61 - 1.24 mg/dL Final   Creatinine, Urine  Date Value Ref Range Status  03/30/2021 79 20 - 320 mg/dL Final         Failed - K in normal range and within 180 days  Potassium  Date Value Ref Range Status  05/08/2021 3.0 (L) 3.5 - 5.1 mmol/L Final  10/10/2011 4.3 3.5 - 5.1 mmol/L Final         Failed - Valid encounter within last 6 months    Recent Outpatient Visits           7 months ago Encounter for general adult medical examination with abnormal findings   [facility] [place], [name], NP   1 year ago Type 2 diabetes mellitus with hyperglycemia, without long-term current use of insulin [facility])   [facility], [name], NP   1 year ago Depression, unspecified depression type   [facility] [name], NP   1 year ago Type 2 diabetes mellitus with hyperglycemia, without long-term current use of insulin [facility])   [facility] [name], NP   1 year ago Type 2 diabetes mellitus with hyperglycemia, without long-term current use of insulin [facility])   [place], [place]              Passed - Patient is not pregnant      Passed - Last BP in normal range    BP Readings from Last 1  Encounters:  05/08/21 121/78

## 2021-11-02 ENCOUNTER — Ambulatory Visit: Payer: Self-pay

## 2021-11-02 NOTE — Patient Outreach (Signed)
  Care Coordination   11/02/2021 Name: Anthony Bell MRN: [id number] DOB: 05/19/1955   Care Coordination Outreach Attempts:  An unsuccessful telephone outreach was attempted today to offer the patient information about available care coordination services as a benefit of their health plan.   Follow Up Plan:  No further outreach attempts will be made at this time. We have been unable to contact the patient to offer or enroll patient in care coordination services. The patient is scheduled with the pharm D. Will collaborate and see if the patient needs further assistance with outreaches from the [facility]  Encounter Outcome:  No Answer  Care Coordination Interventions Activated:  No   Care Coordination Interventions:  No, not indicated    Noreene Larsson RN, MSN, [place] Coordinator [place] [facility] Mobile: [phone]

## 2021-11-09 DIAGNOSIS — L270 Generalized skin eruption due to drugs and medicaments taken internally: Secondary | ICD-10-CM | POA: Diagnosis not present

## 2021-11-09 DIAGNOSIS — C787 Secondary malignant neoplasm of liver and intrahepatic bile duct: Secondary | ICD-10-CM | POA: Diagnosis not present

## 2021-11-09 DIAGNOSIS — Z79899 Other long term (current) drug therapy: Secondary | ICD-10-CM | POA: Diagnosis not present

## 2021-11-09 DIAGNOSIS — F17210 Nicotine dependence, cigarettes, uncomplicated: Secondary | ICD-10-CM | POA: Diagnosis not present

## 2021-11-09 DIAGNOSIS — Z91119 Patient's noncompliance with dietary regimen due to unspecified reason: Secondary | ICD-10-CM | POA: Diagnosis not present

## 2021-11-09 DIAGNOSIS — Z5112 Encounter for antineoplastic immunotherapy: Secondary | ICD-10-CM | POA: Diagnosis not present

## 2021-11-09 DIAGNOSIS — R739 Hyperglycemia, unspecified: Secondary | ICD-10-CM | POA: Diagnosis not present

## 2021-11-09 DIAGNOSIS — Z91148 Patient's other noncompliance with medication regimen for other reason: Secondary | ICD-10-CM | POA: Diagnosis not present

## 2021-11-09 DIAGNOSIS — C7800 Secondary malignant neoplasm of unspecified lung: Secondary | ICD-10-CM | POA: Diagnosis not present

## 2021-11-09 DIAGNOSIS — T451X5A Adverse effect of antineoplastic and immunosuppressive drugs, initial encounter: Secondary | ICD-10-CM | POA: Diagnosis not present

## 2021-11-09 DIAGNOSIS — E876 Hypokalemia: Secondary | ICD-10-CM | POA: Diagnosis not present

## 2021-11-09 DIAGNOSIS — C20 Malignant neoplasm of rectum: Secondary | ICD-10-CM | POA: Diagnosis not present

## 2021-11-23 DIAGNOSIS — Z5111 Encounter for antineoplastic chemotherapy: Secondary | ICD-10-CM | POA: Diagnosis not present

## 2021-11-23 DIAGNOSIS — Z7984 Long term (current) use of oral hypoglycemic drugs: Secondary | ICD-10-CM | POA: Diagnosis not present

## 2021-11-23 DIAGNOSIS — Z5112 Encounter for antineoplastic immunotherapy: Secondary | ICD-10-CM | POA: Diagnosis not present

## 2021-11-23 DIAGNOSIS — C20 Malignant neoplasm of rectum: Secondary | ICD-10-CM | POA: Diagnosis not present

## 2021-11-23 DIAGNOSIS — C787 Secondary malignant neoplasm of liver and intrahepatic bile duct: Secondary | ICD-10-CM | POA: Diagnosis not present

## 2021-11-23 DIAGNOSIS — Z8719 Personal history of other diseases of the digestive system: Secondary | ICD-10-CM | POA: Diagnosis not present

## 2021-11-23 DIAGNOSIS — Z7982 Long term (current) use of aspirin: Secondary | ICD-10-CM | POA: Diagnosis not present

## 2021-11-23 DIAGNOSIS — Z79899 Other long term (current) drug therapy: Secondary | ICD-10-CM | POA: Diagnosis not present

## 2021-11-23 DIAGNOSIS — E1165 Type 2 diabetes mellitus with hyperglycemia: Secondary | ICD-10-CM | POA: Diagnosis not present

## 2021-11-23 DIAGNOSIS — F17210 Nicotine dependence, cigarettes, uncomplicated: Secondary | ICD-10-CM | POA: Diagnosis not present

## 2021-11-23 DIAGNOSIS — Z7952 Long term (current) use of systemic steroids: Secondary | ICD-10-CM | POA: Diagnosis not present

## 2021-11-23 DIAGNOSIS — E876 Hypokalemia: Secondary | ICD-10-CM | POA: Diagnosis not present

## 2021-12-07 DIAGNOSIS — E876 Hypokalemia: Secondary | ICD-10-CM | POA: Diagnosis not present

## 2021-12-07 DIAGNOSIS — C7800 Secondary malignant neoplasm of unspecified lung: Secondary | ICD-10-CM | POA: Diagnosis not present

## 2021-12-07 DIAGNOSIS — C20 Malignant neoplasm of rectum: Secondary | ICD-10-CM | POA: Diagnosis not present

## 2021-12-07 DIAGNOSIS — R739 Hyperglycemia, unspecified: Secondary | ICD-10-CM | POA: Diagnosis not present

## 2021-12-07 DIAGNOSIS — Z8719 Personal history of other diseases of the digestive system: Secondary | ICD-10-CM | POA: Diagnosis not present

## 2021-12-07 DIAGNOSIS — Z5111 Encounter for antineoplastic chemotherapy: Secondary | ICD-10-CM | POA: Diagnosis not present

## 2021-12-07 DIAGNOSIS — Z79899 Other long term (current) drug therapy: Secondary | ICD-10-CM | POA: Diagnosis not present

## 2021-12-07 DIAGNOSIS — F17210 Nicotine dependence, cigarettes, uncomplicated: Secondary | ICD-10-CM | POA: Diagnosis not present

## 2021-12-07 DIAGNOSIS — Z7952 Long term (current) use of systemic steroids: Secondary | ICD-10-CM | POA: Diagnosis not present

## 2021-12-07 DIAGNOSIS — Z5112 Encounter for antineoplastic immunotherapy: Secondary | ICD-10-CM | POA: Diagnosis not present

## 2021-12-07 DIAGNOSIS — C787 Secondary malignant neoplasm of liver and intrahepatic bile duct: Secondary | ICD-10-CM | POA: Diagnosis not present

## 2021-12-07 DIAGNOSIS — Z7982 Long term (current) use of aspirin: Secondary | ICD-10-CM | POA: Diagnosis not present

## 2021-12-20 ENCOUNTER — Telehealth: Payer: BC Managed Care – PPO

## 2021-12-20 ENCOUNTER — Telehealth: Payer: Self-pay | Admitting: Pharmacist

## 2021-12-20 NOTE — Telephone Encounter (Signed)
  Care Management   Outreach Note  12/20/2021 Name: Mikaeel Petrow MRN: [id number] DOB: 08/11/1955  Referred by: [name], NP Reason for referral : No chief complaint on file.   Was unable to reach patient via telephone today and have left HIPAA compliant voicemail asking patient to return my call.    Follow Up Plan: Will attempt to reach patient by telephone again within the next 3 months  Wallace Cullens, PharmD, [place] [facility] [phone]

## 2021-12-21 DIAGNOSIS — F17210 Nicotine dependence, cigarettes, uncomplicated: Secondary | ICD-10-CM | POA: Diagnosis not present

## 2021-12-21 DIAGNOSIS — C20 Malignant neoplasm of rectum: Secondary | ICD-10-CM | POA: Diagnosis not present

## 2021-12-21 DIAGNOSIS — C787 Secondary malignant neoplasm of liver and intrahepatic bile duct: Secondary | ICD-10-CM | POA: Diagnosis not present

## 2021-12-21 DIAGNOSIS — Z79899 Other long term (current) drug therapy: Secondary | ICD-10-CM | POA: Diagnosis not present

## 2021-12-21 DIAGNOSIS — N1330 Unspecified hydronephrosis: Secondary | ICD-10-CM | POA: Diagnosis not present

## 2021-12-21 DIAGNOSIS — C19 Malignant neoplasm of rectosigmoid junction: Secondary | ICD-10-CM | POA: Diagnosis not present

## 2021-12-21 DIAGNOSIS — Z5111 Encounter for antineoplastic chemotherapy: Secondary | ICD-10-CM | POA: Diagnosis not present

## 2021-12-21 DIAGNOSIS — Z5112 Encounter for antineoplastic immunotherapy: Secondary | ICD-10-CM | POA: Diagnosis not present

## 2021-12-21 DIAGNOSIS — R739 Hyperglycemia, unspecified: Secondary | ICD-10-CM | POA: Diagnosis not present

## 2021-12-21 DIAGNOSIS — C7802 Secondary malignant neoplasm of left lung: Secondary | ICD-10-CM | POA: Diagnosis not present

## 2021-12-24 ENCOUNTER — Other Ambulatory Visit: Payer: Self-pay | Admitting: Internal Medicine

## 2021-12-24 DIAGNOSIS — E785 Hyperlipidemia, unspecified: Secondary | ICD-10-CM

## 2021-12-25 NOTE — Telephone Encounter (Signed)
Patient will need an office visit for further refills. Requested Prescriptions  Pending Prescriptions Disp Refills  . atorvastatin (LIPITOR) 20 MG tablet [Pharmacy Med Name: ATORVASTATIN 20 MG TABLET] 90 tablet 0    Sig: TAKE 1 TABLET BY MOUTH EVERY DAY     Cardiovascular:  Antilipid - Statins Failed - 12/24/2021  1:49 AM      Failed - Lipid Panel in normal range within the last 12 months    Cholesterol  Date Value Ref Range Status  03/30/2021 123 <200 mg/dL Final  03/30/2021 127 <200 mg/dL Final   LDL Cholesterol (Calc)  Date Value Ref Range Status  03/30/2021 68 mg/dL (calc) Final    Comment:    Reference range: <100 . Desirable range <100 mg/dL for primary prevention;   <70 mg/dL for patients with CHD or diabetic patients  with > or = 2 CHD risk factors. Marland Kitchen LDL-C is now calculated using the Martin-Hopkins  calculation, which is a validated novel method providing  better accuracy than the Friedewald equation in the  estimation of LDL-C.  Cresenciano Genre et al. Annamaria Helling. 2725;366(44): 2061-2068  (http://education.[employer].com/faq/FAQ164)   03/30/2021 70 <100 mg/dL (calc) Final    Comment:    Desirable range <100 mg/dL for primary prevention; <70 mg/dL for patients with CHD or diabetic patients with >= 2 CHD risk factors. LDL-C is now calculated using the Martin-Hopkins calculation, which is a validated novel method providing better accuracy than the Friedewald equation in the estimation of LDL-C. Cresenciano Genre et al. Annamaria Helling. 0347;425(95): 2061-2068 (http://education.[facility].com/faq/FAQ164) LDL-C is now calculated using the Martin-Hopkins  calculation, which is a validated novel method providing  better accuracy than the Friedewald equation in the  estimation of LDL-C.  Cresenciano Genre et al. Annamaria Helling. 6387;564(33): 2061-2068  (http://education.[facility].com/faq/FAQ164)    HDL  Date Value Ref Range Status  03/30/2021 33 (L) > OR = 40 mg/dL Final  03/30/2021 36 (L) >39  mg/dL Final   Triglycerides  Date Value Ref Range Status  03/30/2021 135 <150 mg/dL Final  03/30/2021 130 <150 mg/dL Final         Passed - Patient is not pregnant      Passed - Valid encounter within last 12 months    Recent Outpatient Visits          9 months ago Encounter for general adult medical examination with abnormal findings   [facility] [place], [name], NP   1 year ago Type 2 diabetes mellitus with hyperglycemia, without long-term current use of insulin [facility])   [facility] [place], [name], NP   1 year ago Depression, unspecified depression type   [facility] [name], NP   1 year ago Type 2 diabetes mellitus with hyperglycemia, without long-term current use of insulin [facility])   [facility] [name], NP   1 year ago Type 2 diabetes mellitus with hyperglycemia, without long-term current use of insulin [facility])   [facility] [name], NP

## 2022-01-04 DIAGNOSIS — N1330 Unspecified hydronephrosis: Secondary | ICD-10-CM | POA: Diagnosis not present

## 2022-01-04 DIAGNOSIS — C787 Secondary malignant neoplasm of liver and intrahepatic bile duct: Secondary | ICD-10-CM | POA: Diagnosis not present

## 2022-01-04 DIAGNOSIS — C20 Malignant neoplasm of rectum: Secondary | ICD-10-CM | POA: Diagnosis not present

## 2022-01-04 DIAGNOSIS — Z5111 Encounter for antineoplastic chemotherapy: Secondary | ICD-10-CM | POA: Diagnosis not present

## 2022-01-04 DIAGNOSIS — Z79899 Other long term (current) drug therapy: Secondary | ICD-10-CM | POA: Diagnosis not present

## 2022-01-04 DIAGNOSIS — E876 Hypokalemia: Secondary | ICD-10-CM | POA: Diagnosis not present

## 2022-01-04 DIAGNOSIS — C7802 Secondary malignant neoplasm of left lung: Secondary | ICD-10-CM | POA: Diagnosis not present

## 2022-01-04 DIAGNOSIS — I10 Essential (primary) hypertension: Secondary | ICD-10-CM | POA: Diagnosis not present

## 2022-01-04 DIAGNOSIS — Z5112 Encounter for antineoplastic immunotherapy: Secondary | ICD-10-CM | POA: Diagnosis not present

## 2022-01-04 DIAGNOSIS — Z23 Encounter for immunization: Secondary | ICD-10-CM | POA: Diagnosis not present

## 2022-01-04 DIAGNOSIS — Z87891 Personal history of nicotine dependence: Secondary | ICD-10-CM | POA: Diagnosis not present

## 2022-01-04 DIAGNOSIS — C7801 Secondary malignant neoplasm of right lung: Secondary | ICD-10-CM | POA: Diagnosis not present

## 2022-01-18 ENCOUNTER — Encounter: Payer: Self-pay | Admitting: Internal Medicine

## 2022-01-18 ENCOUNTER — Other Ambulatory Visit: Payer: Self-pay | Admitting: Internal Medicine

## 2022-01-18 ENCOUNTER — Ambulatory Visit (INDEPENDENT_AMBULATORY_CARE_PROVIDER_SITE_OTHER): Payer: BC Managed Care – PPO | Admitting: Internal Medicine

## 2022-01-18 VITALS — BP 134/72 | HR 70 | Temp 96.8°F | Wt 171.0 lb

## 2022-01-18 DIAGNOSIS — E1165 Type 2 diabetes mellitus with hyperglycemia: Secondary | ICD-10-CM | POA: Diagnosis not present

## 2022-01-18 DIAGNOSIS — Z794 Long term (current) use of insulin: Secondary | ICD-10-CM | POA: Diagnosis not present

## 2022-01-18 DIAGNOSIS — I700 Atherosclerosis of aorta: Secondary | ICD-10-CM

## 2022-01-18 DIAGNOSIS — K219 Gastro-esophageal reflux disease without esophagitis: Secondary | ICD-10-CM

## 2022-01-18 DIAGNOSIS — I10 Essential (primary) hypertension: Secondary | ICD-10-CM | POA: Diagnosis not present

## 2022-01-18 DIAGNOSIS — C20 Malignant neoplasm of rectum: Secondary | ICD-10-CM

## 2022-01-18 DIAGNOSIS — E782 Mixed hyperlipidemia: Secondary | ICD-10-CM

## 2022-01-18 DIAGNOSIS — J42 Unspecified chronic bronchitis: Secondary | ICD-10-CM | POA: Diagnosis not present

## 2022-01-18 DIAGNOSIS — C787 Secondary malignant neoplasm of liver and intrahepatic bile duct: Secondary | ICD-10-CM

## 2022-01-18 DIAGNOSIS — E785 Hyperlipidemia, unspecified: Secondary | ICD-10-CM

## 2022-01-18 DIAGNOSIS — N522 Drug-induced erectile dysfunction: Secondary | ICD-10-CM

## 2022-01-18 DIAGNOSIS — E663 Overweight: Secondary | ICD-10-CM

## 2022-01-18 DIAGNOSIS — Z6826 Body mass index (BMI) 26.0-26.9, adult: Secondary | ICD-10-CM

## 2022-01-18 LAB — POCT GLYCOSYLATED HEMOGLOBIN (HGB A1C): HbA1c POC (<> result, manual entry): 9.6 % (ref 4.0–5.6)

## 2022-01-18 NOTE — Assessment & Plan Note (Signed)
POCT A1c 9.6% Urine albumin has been checked within the last year Continue metformin and Jardiance Increase Tresiba to 24 units daily Monitor fasting sugars and update me in 2 weeks He declines referral to endocrinology at this time Encouraged him to schedule an appointment for diabetic eye exam Encourage routine foot exam Flu shot UTD Pneumovax UTD Encourage him to get his COVID booster

## 2022-01-18 NOTE — Assessment & Plan Note (Signed)
Okay to continue Tums as needed

## 2022-01-18 NOTE — Assessment & Plan Note (Signed)
We will check c-Met and lipid profile at annual exam Encourage him to consume a low-fat diet Continue atorvastatin and aspirin

## 2022-01-18 NOTE — Assessment & Plan Note (Signed)
Encouraged him to consume low-fat diet Continue atorvastatin 

## 2022-01-18 NOTE — Assessment & Plan Note (Signed)
Encourage diet and exercise for weight loss 

## 2022-01-18 NOTE — Telephone Encounter (Signed)
Requested Prescriptions  Pending Prescriptions Disp Refills  . hydrochlorothiazide (HYDRODIURIL) 25 MG tablet [Pharmacy Med Name: HYDROCHLOROTHIAZIDE 25 MG TAB] 90 tablet 1    Sig: TAKE 1 TABLET (25 MG TOTAL) BY MOUTH DAILY.     Cardiovascular: Diuretics - Thiazide Failed - 01/18/2022  3:56 AM      Failed - Cr in normal range and within 180 days    Creat  Date Value Ref Range Status  03/30/2021 1.32 0.70 - 1.35 mg/dL Final   Creatinine, Ser  Date Value Ref Range Status  05/08/2021 1.54 (H) 0.61 - 1.24 mg/dL Final   Creatinine, Urine  Date Value Ref Range Status  03/30/2021 79 20 - 320 mg/dL Final         Failed - K in normal range and within 180 days    Potassium  Date Value Ref Range Status  05/08/2021 3.0 (L) 3.5 - 5.1 mmol/L Final  10/10/2011 4.3 3.5 - 5.1 mmol/L Final         Failed - Na in normal range and within 180 days    Sodium  Date Value Ref Range Status  05/08/2021 137 135 - 145 mmol/L Final  10/10/2011 144 136 - 145 mmol/L Final         Failed - Valid encounter within last 6 months    Recent Outpatient Visits          9 months ago Encounter for general adult medical examination with abnormal findings   [facility] [place], [name], NP   1 year ago Type 2 diabetes mellitus with hyperglycemia, without long-term current use of insulin ([place])   [facility] [place], [name], NP   1 year ago Depression, unspecified depression type   [facility] [name], NP   1 year ago Type 2 diabetes mellitus with hyperglycemia, without long-term current use of insulin [facility])   [facility] [name], NP   1 year ago Type 2 diabetes mellitus with hyperglycemia, without long-term current use of insulin [facility])   [place], NP             Passed - Last BP in normal range    BP Readings from Last 1 Encounters:  05/08/21 121/78         . lisinopril (ZESTRIL) 20 MG tablet  [Pharmacy Med Name: LISINOPRIL 20 MG TABLET] 90 tablet 1    Sig: TAKE 1 TABLET BY MOUTH EVERY DAY     Cardiovascular:  ACE Inhibitors Failed - 01/18/2022  3:56 AM      Failed - Cr in normal range and within 180 days    Creat  Date Value Ref Range Status  03/30/2021 1.32 0.70 - 1.35 mg/dL Final   Creatinine, Ser  Date Value Ref Range Status  05/08/2021 1.54 (H) 0.61 - 1.24 mg/dL Final   Creatinine, Urine  Date Value Ref Range Status  03/30/2021 79 20 - 320 mg/dL Final         Failed - K in normal range and within 180 days    Potassium  Date Value Ref Range Status  05/08/2021 3.0 (L) 3.5 - 5.1 mmol/L Final  10/10/2011 4.3 3.5 - 5.1 mmol/L Final         Failed - Valid encounter within last 6 months    Recent Outpatient Visits          9 months ago Encounter for general adult medical examination with  abnormal findings   [facility] [place], [place] W, NP   1 year ago Type 2 diabetes mellitus with hyperglycemia, without long-term current use of insulin [facility])   [facility] [place], [name], NP   1 year ago Depression, unspecified depression type   [facility] [name], NP   1 year ago Type 2 diabetes mellitus with hyperglycemia, without long-term current use of insulin [facility])   [facility] [name], NP   1 year ago Type 2 diabetes mellitus with hyperglycemia, without long-term current use of insulin [facility])   [facility] [name], [place]             Passed - Patient is not pregnant      Passed - Last BP in normal range    BP Readings from Last 1 Encounters:  05/08/21 121/78         . atorvastatin (LIPITOR) 20 MG tablet [Pharmacy Med Name: ATORVASTATIN 20 MG TABLET] 90 tablet 0    Sig: TAKE 1 TABLET BY MOUTH EVERY DAY     Cardiovascular:  Antilipid - Statins Failed - 01/18/2022  3:56 AM      Failed - Lipid Panel in normal range within the last 12 months    Cholesterol  Date Value  Ref Range Status  03/30/2021 123 <200 mg/dL Final  03/30/2021 127 <200 mg/dL Final   LDL Cholesterol (Calc)  Date Value Ref Range Status  03/30/2021 68 mg/dL (calc) Final    Comment:    Reference range: <100 . Desirable range <100 mg/dL for primary prevention;   <70 mg/dL for patients with CHD or diabetic patients  with > or = 2 CHD risk factors. Marland Kitchen LDL-C is now calculated using the Martin-Hopkins  calculation, which is a validated novel method providing  better accuracy than the Friedewald equation in the  estimation of LDL-C.  Cresenciano Genre et al. Annamaria Helling. 0086;761(95): 2061-2068  (http://education.[facility].com/faq/FAQ164)   03/30/2021 70 <100 mg/dL (calc) Final    Comment:    Desirable range <100 mg/dL for primary prevention; <70 mg/dL for patients with CHD or diabetic patients with >= 2 CHD risk factors. LDL-C is now calculated using the Martin-Hopkins calculation, which is a validated novel method providing better accuracy than the Friedewald equation in the estimation of LDL-C. Cresenciano Genre et al. Annamaria Helling. 0932;671(24): 2061-2068 (http://education.[facility].com/faq/FAQ164) LDL-C is now calculated using the Martin-Hopkins  calculation, which is a validated novel method providing  better accuracy than the Friedewald equation in the  estimation of LDL-C.  Cresenciano Genre et al. Annamaria Helling. 5809;983(38): 2061-2068  (http://education.[facility].com/faq/FAQ164)    HDL  Date Value Ref Range Status  03/30/2021 33 (L) > OR = 40 mg/dL Final  03/30/2021 36 (L) >39 mg/dL Final   Triglycerides  Date Value Ref Range Status  03/30/2021 135 <150 mg/dL Final  03/30/2021 130 <150 mg/dL Final         Passed - Patient is not pregnant      Passed - Valid encounter within last 12 months    Recent Outpatient Visits          9 months ago Encounter for general adult medical examination with abnormal findings   [facility] [place], [name], NP   1 year ago Type 2  diabetes mellitus with hyperglycemia, without long-term current use of insulin [facility])   [facility] [place], [name], NP   1 year ago Depression, unspecified depression type  [facility] [name], NP   1 year ago Type 2 diabetes mellitus with hyperglycemia, without long-term current use of insulin [facility])   [facility] [name], NP   1 year ago Type 2 diabetes mellitus with hyperglycemia, without long-term current use of insulin [facility])   [facility] [name], [place]

## 2022-01-18 NOTE — Assessment & Plan Note (Signed)
Controlled on amlodipine and losartan Reinforced DASH diet and exercise for weight loss Kidney function reviewed

## 2022-01-18 NOTE — Progress Notes (Signed)
Subjective:    Patient ID: Anthony Bell, male    DOB: Feb 04, 1956, 66 y.o.   MRN: [id number]  HPI  Patient presents to clinic today for follow-up of chronic conditions.  HTN: His BP today is 134/72.  He is taking Amlodipine and Losartan as prescribed.  ECG from 04/2021 reviewed.  HLD with Aortic Atherosclerosis: His last LDL was 68, triglycerides 135, 03/2021.  He denies myalgias on Atorvastatin. He is taking ASA as well.  He does not consume a low-fat diet.  DM2: His last A1c was 9.5%, 08/2021.  He is taking Metformin, Jardiance and [place] as prescribed.  His sugars range 99-123.  He does not check his feet routinely.  His last eye exam was > 2 years.  Flu 12/2021.  Pneumovax 07/2017.  Arboriculturist.  GERD: Triggered by spicy foods.  He takes Tums as needed with good relief of symptoms.  Upper GI from 08/2014 reviewed.  COPD: He denies current cough or shortness of breath.  He takes Albuterol as needed with good relief of symptoms.  There are no PFTs on file.  He does continue to smoke.  History of Rectal Cancer: Status post surgery, currently on chemo.  He follows with oncology.  ED: He is not currently taking any medications for this.  He does not follow with urology.   Review of Systems  Past Medical History:  Diagnosis Date   Colon polyps    Diabetes mellitus without complication (HCC)    GERD (gastroesophageal reflux disease)    Hypertension    Rectal cancer (HCC)    [facility]    Current Outpatient Medications  Medication Sig Dispense Refill   amLODipine (NORVASC) 5 MG tablet TAKE 1 TABLET (5 MG TOTAL) BY MOUTH DAILY. 90 tablet 0   aspirin EC 81 MG tablet Take 81 mg by mouth.      atorvastatin (LIPITOR) 20 MG tablet TAKE 1 TABLET BY MOUTH EVERY DAY 90 tablet 0   blood glucose meter kit and supplies KIT Dispense based on patient and insurance preference. Use up to four times daily as directed. 100 strips and lancets please 1 each 0   Blood Glucose Monitoring Suppl (CONTOUR  NEXT EZ) w/Device KIT USE UP TO FOUR TIMES DAILY AS DIRECTED. (FOR ICD-10 E10.9, E11.9). 1 kit 0   CONTOUR NEXT TEST test strip USE AS DIRECTED UPTO 4 TIMES A DAY 100 strip 5   dexamethasone (DECADRON) 4 MG tablet Take 53m (2 x 454mtablets) by mouth in the morning for 2 days after oxaliplatin chemotherapy, then as directed.     empagliflozin (JARDIANCE) 10 MG TABS tablet Take 1 tablet (10 mg total) by mouth daily before breakfast. 90 tablet 1   insulin degludec (TRESIBA) 100 UNIT/ML FlexTouch Pen Inject 20 Units into the skin daily. 15 mL 1   Insulin Pen Needle 32G X 4 MM MISC 1 Units by Does not apply route every morning. Pen needles 90 each 3   metFORMIN (GLUCOPHAGE) 1000 MG tablet TAKE 1 TABLET (1,000 MG TOTAL) BY MOUTH TWICE A DAY WITH A MEAL (Patient taking differently: Take 500 mg by mouth 2 (two) times daily with a meal.) 180 tablet 0   ondansetron (ZOFRAN-ODT) 4 MG disintegrating tablet Take 1 tablet (4 mg total) by mouth every 8 (eight) hours as needed for nausea or vomiting. 20 tablet 0   tadalafil (CIALIS) 5 MG tablet Take 1 tablet (5 mg total) by mouth daily as needed for erectile dysfunction. (Patient not taking: Reported  on 01/18/2022) 10 tablet 0   No current facility-administered medications for this visit.    No Known Allergies  Family History  Problem Relation Age of Onset   Diabetes Mother    Cancer Father    Diabetes Father    Diabetes Sister    Heart attack Neg Hx    Stroke Neg Hx     Social History   Socioeconomic History   Marital status: Married    Spouse name: Not on file   Number of children: Not on file   Years of education: Not on file   Highest education level: Not on file  Occupational History   Not on file  Tobacco Use   Smoking status: Every Day    Packs/day: 1.00    Years: 40.00    Total pack years: 40.00    Types: Cigarettes   Smokeless tobacco: Never  Vaping Use   Vaping Use: Never used  Substance and Sexual Activity   Alcohol use: No    Drug use: No   Sexual activity: Not on file  Other Topics Concern   Not on file  Social History Narrative   Not on file   Social Determinants of Health   Financial Resource Strain: Low Risk  (01/12/2021)   Overall Financial Resource Strain (CARDIA)    Difficulty of Paying Living Expenses: Not hard at all  Food Insecurity: No Food Insecurity (01/12/2021)   Hunger Vital Sign    Worried About Running Out of Food in the Last Year: Never true    [place] in the Last Year: Never true  Transportation Needs: No Transportation Needs (01/12/2021)   PRAPARE - Hydrologist (Medical): No    Lack of Transportation (Non-Medical): No  Physical Activity: Inactive (01/12/2021)   Exercise Vital Sign    Days of Exercise per Week: 0 days    Minutes of Exercise per Session: 0 min  Stress: No Stress Concern Present (01/12/2021)   [place]    Feeling of Stress : Not at all  Social Connections: Moderately Isolated (01/12/2021)   Social Connection and Isolation Panel [NHANES]    Frequency of Communication with Friends and Family: More than three times a week    Frequency of Social Gatherings with Friends and Family: More than three times a week    Attends Religious Services: Never    Marine scientist or Organizations: No    Attends Archivist Meetings: Never    Marital Status: Married  Human resources officer Violence: Not At Risk (01/12/2021)   Humiliation, Afraid, Rape, and Kick questionnaire    Fear of Current or Ex-Partner: No    Emotionally Abused: No    Physically Abused: No    Sexually Abused: No     Constitutional: Denies fever, malaise, fatigue, headache or abrupt weight changes.  HEENT: Denies eye pain, eye redness, ear pain, ringing in the ears, wax buildup, runny nose, nasal congestion, bloody nose, or sore throat. Respiratory: Denies difficulty breathing, shortness of  breath, cough or sputum production.   Cardiovascular: Denies chest pain, chest tightness, palpitations or swelling in the hands or feet.  Gastrointestinal: Denies abdominal pain, bloating, constipation, diarrhea or blood in the stool.  GU: Denies urgency, frequency, pain with urination, burning sensation, blood in urine, odor or discharge. Musculoskeletal: Denies decrease in range of motion, difficulty with gait, muscle pain or joint pain and swelling.  Skin: Denies redness,  rashes, lesions or ulcercations.  Neurological: Denies dizziness, difficulty with memory, difficulty with speech or problems with balance and coordination.  Psych: Denies anxiety, depression, SI/HI.  No other specific complaints in a complete review of systems (except as listed in HPI above).     Objective:  BP 134/72 (BP Location: Left Arm, Patient Position: Sitting, Cuff Size: Normal)   Pulse 70   Temp (!) 96.8 F (36 C) (Temporal)   Wt 171 lb (77.6 kg)   SpO2 99%   BMI 26.78 kg/m   Wt Readings from Last 3 Encounters:  01/18/22 171 lb (77.6 kg)  05/08/21 197 lb (89.4 kg)  03/30/21 193 lb 9.6 oz (87.8 kg)    General: Appears his stated age, overweight, in NAD. Skin: Warm, dry and intact. No  ulcerations noted. HEENT: Head: normal shape and size; Eyes: sclera white, no icterus, conjunctiva pink, PERRLA and EOMs intact;  Cardiovascular: Normal rate and rhythm. S1,S2 noted.  No murmur, rubs or gallops noted. No JVD or BLE edema. No carotid bruits noted. Pulmonary/Chest: Normal effort and positive vesicular breath sounds. No respiratory distress. No wheezes, rales or ronchi noted.  Abdomen: Soft and nontender. Normal bowel sounds.  Suprapubic catheter in place Musculoskeletal:  No difficulty with gait.  Neurological: Alert and oriented. Cranial nerves II-XII grossly intact. Coordination normal.  Psychiatric: Mood and affect normal. Behavior is normal. Judgment and thought content normal.   BMET    Component  Value Date/Time   NA 137 05/08/2021 0110   NA 144 10/10/2011 1615   K 3.0 (L) 05/08/2021 0110   K 4.3 10/10/2011 1615   CL 99 05/08/2021 0110   CL 104 10/10/2011 1615   CO2 28 05/08/2021 0110   CO2 35 (H) 10/10/2011 1615   GLUCOSE 142 (H) 05/08/2021 0110   GLUCOSE 106 (H) 10/10/2011 1615   BUN 30 (H) 05/08/2021 0110   BUN 17 10/10/2011 1615   CREATININE 1.54 (H) 05/08/2021 0110   CREATININE 1.32 03/30/2021 1550   CALCIUM 9.0 05/08/2021 0110   CALCIUM 9.2 10/10/2011 1615   GFRNONAA 50 (L) 05/08/2021 0110   GFRNONAA 60 09/20/2020 0750   GFRAA 70 09/20/2020 0750    Lipid Panel     Component Value Date/Time   CHOL 123 03/30/2021 1550   CHOL 127 03/30/2021 1550   TRIG 135 03/30/2021 1550   TRIG 130 03/30/2021 1550   HDL 33 (L) 03/30/2021 1550   HDL 36 (L) 03/30/2021 1550   CHOLHDL 3.7 03/30/2021 1550   CHOLHDL 3.5 03/30/2021 1550   LDLCALC 68 03/30/2021 1550   LDLCALC 70 03/30/2021 1550    CBC    Component Value Date/Time   WBC 10.7 (H) 05/08/2021 0110   RBC 6.31 (H) 05/08/2021 0110   HGB 17.0 05/08/2021 0110   HGB 14.9 10/10/2011 1615   HCT 52.3 (H) 05/08/2021 0110   HCT 46.9 10/10/2011 1615   PLT 201 05/08/2021 0110   PLT 159 10/10/2011 1615   MCV 82.9 05/08/2021 0110   MCV 87 10/10/2011 1615   MCH 26.9 05/08/2021 0110   MCHC 32.5 05/08/2021 0110   RDW 15.8 (H) 05/08/2021 0110   RDW 14.3 10/10/2011 1615   LYMPHSABS 1,448 06/02/2020 0958   EOSABS 193 06/02/2020 0958   BASOSABS 31 06/02/2020 0958    Hgb A1C Lab Results  Component Value Date   HGBA1C 6.8 (H) 03/30/2021           Assessment & Plan:

## 2022-01-18 NOTE — Patient Instructions (Signed)

## 2022-01-18 NOTE — Assessment & Plan Note (Signed)
Currently undergoing chemo with [facility] oncology

## 2022-01-18 NOTE — Assessment & Plan Note (Signed)
Not medicated 

## 2022-01-18 NOTE — Assessment & Plan Note (Signed)
Encourage smoking cessation Continue albuterol as needed

## 2022-01-18 NOTE — Progress Notes (Signed)
   Subjective:    Patient ID: Anthony Bell, male    DOB: June 12, 1955, 66 y.o.   MRN: [id number]  HPI    Review of Systems     Objective:   Physical Exam        Assessment & Plan:

## 2022-01-19 MED ORDER — INSULIN DEGLUDEC 100 UNIT/ML ~~LOC~~ SOPN: 20.0000 [IU] | PEN_INJECTOR | Freq: Every day | SUBCUTANEOUS | 1 refills | Status: DC

## 2022-01-19 MED ORDER — METFORMIN HCL 1000 MG PO TABS: 1000.0000 mg | ORAL_TABLET | Freq: Two times a day (BID) | ORAL | 0 refills | Status: DC

## 2022-01-19 MED ORDER — AMLODIPINE BESYLATE 5 MG PO TABS: 5.0000 mg | ORAL_TABLET | Freq: Every day | ORAL | 0 refills | Status: DC

## 2022-01-19 MED ORDER — EMPAGLIFLOZIN 10 MG PO TABS: 10.0000 mg | ORAL_TABLET | Freq: Every day | ORAL | 0 refills | Status: DC

## 2022-01-19 MED ORDER — ATORVASTATIN CALCIUM 20 MG PO TABS: 20.0000 mg | ORAL_TABLET | Freq: Every day | ORAL | 0 refills | Status: DC

## 2022-01-19 NOTE — Addendum Note (Signed)
Addended by: [name] on: 01/19/2022 01:35 PM   Modules accepted: Orders

## 2022-01-25 DIAGNOSIS — Z5111 Encounter for antineoplastic chemotherapy: Secondary | ICD-10-CM | POA: Diagnosis not present

## 2022-01-25 DIAGNOSIS — C20 Malignant neoplasm of rectum: Secondary | ICD-10-CM | POA: Diagnosis not present

## 2022-01-25 DIAGNOSIS — Z5112 Encounter for antineoplastic immunotherapy: Secondary | ICD-10-CM | POA: Diagnosis not present

## 2022-02-08 DIAGNOSIS — C787 Secondary malignant neoplasm of liver and intrahepatic bile duct: Secondary | ICD-10-CM | POA: Diagnosis not present

## 2022-02-08 DIAGNOSIS — C7802 Secondary malignant neoplasm of left lung: Secondary | ICD-10-CM | POA: Diagnosis not present

## 2022-02-08 DIAGNOSIS — F17210 Nicotine dependence, cigarettes, uncomplicated: Secondary | ICD-10-CM | POA: Diagnosis not present

## 2022-02-08 DIAGNOSIS — Z5112 Encounter for antineoplastic immunotherapy: Secondary | ICD-10-CM | POA: Diagnosis not present

## 2022-02-08 DIAGNOSIS — N1330 Unspecified hydronephrosis: Secondary | ICD-10-CM | POA: Diagnosis not present

## 2022-02-08 DIAGNOSIS — C19 Malignant neoplasm of rectosigmoid junction: Secondary | ICD-10-CM | POA: Diagnosis not present

## 2022-02-08 DIAGNOSIS — Z79899 Other long term (current) drug therapy: Secondary | ICD-10-CM | POA: Diagnosis not present

## 2022-02-08 DIAGNOSIS — E876 Hypokalemia: Secondary | ICD-10-CM | POA: Diagnosis not present

## 2022-02-08 DIAGNOSIS — I10 Essential (primary) hypertension: Secondary | ICD-10-CM | POA: Diagnosis not present

## 2022-02-08 DIAGNOSIS — C20 Malignant neoplasm of rectum: Secondary | ICD-10-CM | POA: Diagnosis not present

## 2022-02-08 DIAGNOSIS — Z5111 Encounter for antineoplastic chemotherapy: Secondary | ICD-10-CM | POA: Diagnosis not present

## 2022-02-22 DIAGNOSIS — Z5111 Encounter for antineoplastic chemotherapy: Secondary | ICD-10-CM | POA: Diagnosis not present

## 2022-02-22 DIAGNOSIS — C20 Malignant neoplasm of rectum: Secondary | ICD-10-CM | POA: Diagnosis not present

## 2022-02-22 DIAGNOSIS — Z5112 Encounter for antineoplastic immunotherapy: Secondary | ICD-10-CM | POA: Diagnosis not present

## 2022-03-05 ENCOUNTER — Telehealth: Payer: Self-pay | Admitting: Internal Medicine

## 2022-03-05 NOTE — Telephone Encounter (Signed)
Pt came in needing a refill on his Rx Insulin Pen Needles 32G  x 4 MM MISC pt is out and would like to see if it can be called in today.   [facility] [place], [place]

## 2022-03-06 ENCOUNTER — Other Ambulatory Visit: Payer: Self-pay | Admitting: Internal Medicine

## 2022-03-06 MED ORDER — INSULIN PEN NEEDLE 32G X 4 MM MISC: 1.0000 [IU] | Freq: Every morning | 0 refills | Status: DC

## 2022-03-06 MED ORDER — INSULIN PEN NEEDLE 32G X 4 MM MISC: 1.0000 [IU] | Freq: Every morning | 3 refills | Status: DC

## 2022-03-06 NOTE — Addendum Note (Signed)
Addended by: Ashley Royalty E on: 03/06/2022 02:29 PM   Modules accepted: Orders

## 2022-03-08 DIAGNOSIS — Z5111 Encounter for antineoplastic chemotherapy: Secondary | ICD-10-CM | POA: Diagnosis not present

## 2022-03-08 DIAGNOSIS — C20 Malignant neoplasm of rectum: Secondary | ICD-10-CM | POA: Diagnosis not present

## 2022-03-08 DIAGNOSIS — F17210 Nicotine dependence, cigarettes, uncomplicated: Secondary | ICD-10-CM | POA: Diagnosis not present

## 2022-03-08 DIAGNOSIS — C19 Malignant neoplasm of rectosigmoid junction: Secondary | ICD-10-CM | POA: Diagnosis not present

## 2022-03-08 DIAGNOSIS — Z5112 Encounter for antineoplastic immunotherapy: Secondary | ICD-10-CM | POA: Diagnosis not present

## 2022-03-08 DIAGNOSIS — N1330 Unspecified hydronephrosis: Secondary | ICD-10-CM | POA: Diagnosis not present

## 2022-03-08 DIAGNOSIS — Z79899 Other long term (current) drug therapy: Secondary | ICD-10-CM | POA: Diagnosis not present

## 2022-03-08 DIAGNOSIS — C787 Secondary malignant neoplasm of liver and intrahepatic bile duct: Secondary | ICD-10-CM | POA: Diagnosis not present

## 2022-03-13 ENCOUNTER — Other Ambulatory Visit: Payer: Self-pay | Admitting: Internal Medicine

## 2022-03-13 NOTE — Telephone Encounter (Signed)
Medication Refill - Medication:  Pen needle  Has the patient contacted their pharmacy? Yes.     (Agent: If yes, when and what did the pharmacy advise?) do not have Insulin Pen Needle 32G X 4 MM MISC  need a new Rx for different pen needle  Preferred Pharmacy (with phone number or street name):  [id number] [address] [address] Phone: [phone] Fax: [phone]    Has the patient been seen for an appointment in the last year OR does the patient have an upcoming appointment? Yes.    Agent: Please be advised that RX refills may take up to 3 business days. We ask that you follow-up with your pharmacy.

## 2022-03-14 ENCOUNTER — Other Ambulatory Visit: Payer: Self-pay | Admitting: Internal Medicine

## 2022-03-14 ENCOUNTER — Ambulatory Visit: Payer: BC Managed Care – PPO | Admitting: Pharmacist

## 2022-03-14 DIAGNOSIS — E1165 Type 2 diabetes mellitus with hyperglycemia: Secondary | ICD-10-CM

## 2022-03-14 MED ORDER — INSULIN PEN NEEDLE 31G X 5 MM MISC: 3 refills | Status: DC

## 2022-03-14 NOTE — Patient Instructions (Signed)
Goals Addressed             This Visit's Progress    Pharmacy Goals       Our goal A1c is less than 7%. This corresponds with fasting sugars less than 130 and 2 hour after meal sugars less than 180. Please check your fasting blood sugar and keep a log of the results   Feel free to call me with any questions or concerns. I look forward to our next call!  Wallace Cullens, PharmD, [place] [phone]

## 2022-03-14 NOTE — Telephone Encounter (Signed)
Unable to refill per protocol, last refill by  provider 03/06/22. Will refuse duplicate request.  Requested Prescriptions  Pending Prescriptions Disp Refills   Insulin Pen Needle 32G X 4 MM MISC 90 each 0    Sig: 1 Units by Does not apply route every morning. Pen needles     Endocrinology: Diabetes - Testing Supplies Passed - 03/13/2022  2:28 PM      Passed - Valid encounter within last 12 months    Recent Outpatient Visits           1 month ago Type 2 diabetes mellitus with hyperglycemia, with long-term current use of insulin [facility])   [facility], [name], NP   11 months ago Encounter for general adult medical examination with abnormal findings   [facility] [place], [name], NP   1 year ago Type 2 diabetes mellitus with hyperglycemia, without long-term current use of insulin [facility])   [facility], [name], NP   1 year ago Depression, unspecified depression type   [place], NP   1 year ago Type 2 diabetes mellitus with hyperglycemia, without long-term current use of insulin [facility])   [facility] [name], NP       Future Appointments             In 1 month [place], [name], NP [facility]

## 2022-03-14 NOTE — Chronic Care Management (AMB) (Signed)
03/14/2022 Name: Anthony Bell MRN: [id number] DOB: [birth year]  Chief Complaint  Patient presents with   Medication Management   Medication Assistance    Anthony Bell is a 66 y.o. year old male who presented for a telephone visit.   They were referred to the pharmacist by their PCP for assistance in managing diabetes, hypertension, hyperlipidemia, and medication access.    Subjective:  Care Team: Primary Care Provider: [name], NP ; Next Scheduled Visit: 04/20/2022 Oncology: [facility]; Next Scheduled Visit: 03/22/2022  Medication Access/Adherence  [id number] [address]: [phone]: [address]: [phone]: [phone]  Patient reports affordability concerns with their medications: No  Patient reports access/transportation concerns to their pharmacy: No  Patient reports adherence concerns with their medications:  Yes    Today patient reports that he has been unable to obtain his insulin pen needles for ~2 weeks as pharmacy advised him that his previous 32 gauge pen needles were no longer covered.  - From review of chart, note patient's PCP [facility] - [place]reach to [place] patient today to follow up. [facility] advises patient's pen needle prescription now available for pick up for no charge. - Follow up with patient who states he will go by pharmacy to pick these up  T2DM: Uncontrolled; current treatment: Metformin 1000 mg twice daily with meals Jardiance 10 mg daily before breakfast Tresiba 24 units daily Reports he has not taken his [place] > 1 week due to not having pen needles Reports increased Tresiba dose to 24 units daily as directed by PCP at  Office Visit on 01/18/2022 Unable to review specific blood sugar readings today as patient not home, but recalls recent home blood sugars readings have been elevated, running in 200s, since out of pen needles Reports prior to running out of pen needles, blood sugar control was better. Denies symptoms of hypoglycemia  Current medication access support: Uses copayment card from manufacturers for Jardiance and Tresiba   Objective: Lab Results  Component Value Date   HGBA1C 9.6 01/18/2022    Lab Results  Component Value Date   CREATININE 1.54 (H) 05/08/2021   BUN 30 (H) 05/08/2021   NA 137 05/08/2021   K 3.0 (L) 05/08/2021   CL 99 05/08/2021   CO2 28 05/08/2021    Lab Results  Component Value Date   CHOL 123 03/30/2021   CHOL 127 03/30/2021   HDL 33 (L) 03/30/2021   HDL 36 (L) 03/30/2021   LDLCALC 68 03/30/2021   LDLCALC 70 03/30/2021   TRIG 135 03/30/2021   TRIG 130 03/30/2021   CHOLHDL 3.7 03/30/2021   CHOLHDL 3.5 03/30/2021    Medications Reviewed Today     Reviewed by BJearld Fenton NP (Nurse Practitioner) on 01/18/22 at 1MelvilleList Status: <None>   Medication Order Taking? Sig Documenting Provider Last Dose Status Informant  amLODipine (NORVASC) 5 MG tablet [id number] TAKE 1 TABLET (5 MG TOTAL) BY MOUTH DAILY. BJearld Fenton NP Taking Active   aspirin EC 81 MG tablet [id number] Take 81 mg by mouth.  [provider] Taking Active Self  atorvastatin (LIPITOR) 20 MG tablet [id number] TAKE 1  TABLET BY MOUTH EVERY DAY [name], NP Taking Active   blood glucose meter kit and supplies KIT [id number] Yes Dispense based on patient and insurance preference. Use up to four times daily as directed. 100 strips and lancets please [name], NP Taking Active   Blood Glucose Monitoring Suppl (CONTOUR NEXT EZ) w/Device KIT [id number] Yes USE UP TO FOUR TIMES DAILY AS DIRECTED. (FOR ICD-10 E10.9, E11.9). [name], DO Taking Active    CONTOUR NEXT TEST test strip [id number] Yes USE AS DIRECTED UPTO 4 TIMES A [name], NP Taking Active   dexamethasone (DECADRON) 4 MG tablet [id number] Yes Take 19m (2 x 465mtablets) by mouth in the morning for 2 days after oxaliplatin chemotherapy, then as directed. [provider] Taking Active   empagliflozin (JARDIANCE) 10 MG TABS tablet [id number] Take 1 tablet (10 mg total) by mouth daily before breakfast. BaJearld FentonNP Taking Active   insulin degludec (TRESIBA) 100 UNIT/ML FlexTouch Pen [id number] Inject 20 Units into the skin daily. BaJearld FentonNP Taking Active   Insulin Pen Needle 32G X 4 MM MISC [id number] 1 Units by Does not apply route every morning. Pen needles WiKathrine HaddockNP Taking Active   metFORMIN (GLUCOPHAGE) 1000 MG tablet [id number] TAKE 1 TABLET (1,000 MG TOTAL) BY MOUTH TWICE A DAY WITH A MEAL  Patient taking differently: Take 500 mg by mouth 2 (two) times daily with a meal.   BaGarnette GunnerReCoralie KeensNP Taking Active   ondansetron (ZOFRAN-ODT) 4 MG disintegrating tablet [id number] Take 1 tablet (4 mg total) by mouth every 8 (eight) hours as needed for nausea or vomiting. VeAlfred LevinsaKentuckyMD Taking Active               Assessment/Plan:   Unable to review medications or blood pressure readings as patient not currently home during this telephone appointment  Diabetes: - Currently uncontrolled - Recommend to pick up pen needles Rx from pharmacy and restart taking TrTyler Aass directed by PCP  - Have counseled on monitoring for symptoms of hypoglycemia and how to manage lows Note patient carries soft peppermint candies with him to use as rapid source of sugar if needed for lows - Encouraged patient to monitor and contact provider if needed for readings outside established parameters or any symptoms of hypoglycemia   Agree to follow up again in 2 weeks to review medications, blood pressure readings and blood sugar  readings   Follow Up Plan: Clinical Pharmacist will outreach to patient by telephone on 03/28/2022 at 3:15 pm  ElWallace CullensPharmD, BCWarren AFB Medical [phone]

## 2022-03-22 DIAGNOSIS — Z5112 Encounter for antineoplastic immunotherapy: Secondary | ICD-10-CM | POA: Diagnosis not present

## 2022-03-22 DIAGNOSIS — C20 Malignant neoplasm of rectum: Secondary | ICD-10-CM | POA: Diagnosis not present

## 2022-03-22 DIAGNOSIS — F17210 Nicotine dependence, cigarettes, uncomplicated: Secondary | ICD-10-CM | POA: Diagnosis not present

## 2022-03-22 DIAGNOSIS — Z79899 Other long term (current) drug therapy: Secondary | ICD-10-CM | POA: Diagnosis not present

## 2022-03-22 DIAGNOSIS — R590 Localized enlarged lymph nodes: Secondary | ICD-10-CM | POA: Diagnosis not present

## 2022-03-22 DIAGNOSIS — N1330 Unspecified hydronephrosis: Secondary | ICD-10-CM | POA: Diagnosis not present

## 2022-03-22 DIAGNOSIS — Z5111 Encounter for antineoplastic chemotherapy: Secondary | ICD-10-CM | POA: Diagnosis not present

## 2022-03-28 ENCOUNTER — Ambulatory Visit: Payer: BC Managed Care – PPO | Admitting: Pharmacist

## 2022-03-28 DIAGNOSIS — N132 Hydronephrosis with renal and ureteral calculous obstruction: Secondary | ICD-10-CM | POA: Diagnosis not present

## 2022-03-28 DIAGNOSIS — N1330 Unspecified hydronephrosis: Secondary | ICD-10-CM | POA: Diagnosis not present

## 2022-03-28 DIAGNOSIS — E1165 Type 2 diabetes mellitus with hyperglycemia: Secondary | ICD-10-CM

## 2022-03-28 DIAGNOSIS — I10 Essential (primary) hypertension: Secondary | ICD-10-CM

## 2022-03-28 NOTE — Patient Instructions (Signed)
Goals Addressed             This Visit's Progress    Pharmacy Goals       Our goal A1c is less than 7%. This corresponds with fasting sugars less than 130 and 2 hour after meal sugars less than 180. Please check your fasting blood sugar and keep a log of the results  Check your blood pressure twice weekly, and any time you have concerning symptoms like headache, chest pain, dizziness, shortness of breath, or vision changes.    To appropriately check your blood pressure, make sure you do the following:  1) Avoid caffeine, exercise, or tobacco products for 30 minutes before checking. Empty your bladder. 2) Sit with your back supported in a flat-backed chair. Rest your arm on something flat (arm of the chair, table, etc). 3) Sit still with your feet flat on the floor, resting, for at least 5 minutes.  4) Check your blood pressure. Take 1-2 readings.  5) Write down these readings and bring with you to any provider appointments.  Bring your home blood pressure machine with you to a provider's office for accuracy comparison at least once a year.   Make sure you take your blood pressure medications before you come to any office visit, even if you were asked to fast for labs.   Feel free to call me with any questions or concerns. I look forward to our next call!  Rector Devonshire Tobias Avitabile, PharmD, BCACP Clinical Pharmacist [facility] [phone]        

## 2022-03-28 NOTE — Progress Notes (Signed)
03/28/2022 Name: Anthony Bell MRN: [id number] DOB: June 20, 1955  Chief Complaint  Patient presents with   Medication Management    Anthony Bell is a 67 y.o. year old male who presented for a telephone visit.   They were referred to the pharmacist by their PCP for assistance in managing diabetes, hypertension, hyperlipidemia, and medication access.    Subjective:  Care Team: Primary Care Provider: [name], NP ; Next Scheduled Visit: 04/20/2022 Oncology: [facility]; Next Scheduled Visit: 04/05/2022  Medication Access/Adherence  [facility]  [id number] [address]Phone: [phone]: [address] [place]Phone: [phone]: [phone]  Patient reports affordability concerns with their medications: No  Patient reports access/transportation concerns to their pharmacy: No  Patient reports adherence concerns with their medications:  Yes  Encourage patient to restart using weekly pillbox to organize his medications - Identify patient in need of refills of lisinopril 5 mg Rx and Tresiba Rx  Patient states he will call pharmacy today to have these refilled   T2DM: Uncontrolled; current treatment: Metformin 1000 mg twice daily with meals Jardiance 10 mg daily before breakfast Tresiba 24 units daily Patient does not have specific readings to review, but recalls recent morning fasting blood sugar running in ~120s Denies symptoms of hypoglycemia   Current medication access support: Uses copayment card from manufacturers for [employer]   Hypertension: Current treatment: amlodipine 5 mg daily Identify patient currently out of lisinopril 5 mg daily Rx as prescribed by DLaurel BayDenies monitoring home blood pressure recently Denies symptoms of hypotension   Objective: Lab  Results  Component Value Date   HGBA1C 9.6 01/18/2022    Lab Results  Component Value Date   CREATININE 1.54 (H) 05/08/2021   BUN 30 (H) 05/08/2021   NA 137 05/08/2021   K 3.0 (L) 05/08/2021   CL 99 05/08/2021   CO2 28 05/08/2021    Lab Results  Component Value Date   CHOL 123 03/30/2021   CHOL 127 03/30/2021   HDL 33 (L) 03/30/2021   HDL 36 (L) 03/30/2021   LDLCALC 68 03/30/2021   LDLCALC 70 03/30/2021   TRIG 135 03/30/2021   TRIG 130 03/30/2021   CHOLHDL 3.7 03/30/2021   CHOLHDL 3.5 03/30/2021   BP Readings from Last 3 Encounters:  01/18/22 134/72  05/08/21 121/78  03/30/21 (!) 131/58   Pulse Readings from Last 3 Encounters:  01/18/22 70  05/08/21 66  03/30/21 74     Medications Reviewed Today     Reviewed by DRennis Petty RPH-CPP (Pharmacist) on 03/28/22 at 1531  Med List Status: <None>   Medication Order Taking? Sig Documenting Provider Last Dose Status Informant  amLODipine (NORVASC) 5 MG tablet [id number] Take 1 tablet (5 mg total) by mouth daily. BJearld Fenton NP Taking Active   aspirin EC 81 MG tablet [id number] Take 81 mg by mouth daily. Swallow whole.  Patient not taking: Reported on 03/28/2022   [provider] Not Taking Active   atorvastatin (LIPITOR) 20 MG tablet [id number] Take 1 tablet (20 mg total) by mouth daily. BJearld Fenton NP Taking Active   blood glucose meter kit and supplies KIT [id number] Dispense based on patient and insurance preference. Use up to four times daily as directed. 100 strips and lancets please  [name], NP  Active   Blood Glucose Monitoring Suppl (CONTOUR NEXT EZ) w/Device KIT [id number]  USE UP TO FOUR TIMES DAILY AS DIRECTED. (FOR ICD-10 E10.9, E11.9). [name], DO  Active   CONTOUR NEXT TEST test strip [id number]  USE AS DIRECTED UPTO 4 TIMES A [name], NP  Active   dexamethasone (DECADRON) 4 MG tablet [id number]  Take 64m (2 x 481mtablets) by mouth in the morning  for 2 days after oxaliplatin chemotherapy, then as directed. [provider]  Active   empagliflozin (JARDIANCE) 10 MG TABS tablet [id number] Take 1 tablet (10 mg total) by mouth daily before breakfast. BaJearld FentonNP Taking Active   insulin degludec (TRESIBA) 100 UNIT/ML FlexTouch Pen [id number] Inject 20 Units into the skin daily.  Patient taking differently: Inject 24 Units into the skin daily.   BaJearld FentonNP Taking Active   Insulin Pen Needle 31G X 5 MM MISC [id number] Pen Needles- brand specific Inject insulin via insulin pen 6 x daily BaJearld FentonNP  Active   magnesium oxide (MAG-OX) 400 MG tablet [id number] Take 2 tablets (800 mg total) by mouth once daily [provider] Taking Active   metFORMIN (GLUCOPHAGE) 1000 MG tablet [id number] Take 1 tablet (1,000 mg total) by mouth 2 (two) times daily with a meal. TAKE 1 TABLET (1,000 MG TOTAL) BY MOUTH TWICE A DAY WITH A MEAL Strength: 1,000 mg Baity, Regina W, NP Taking Active   ondansetron (ZOFRAN-ODT) 4 MG disintegrating tablet [id number] 1 tablet (4 mg total) by mouth every 8 (eight) hours as needed for nausea or vomiting. VeAlfred LevinsCaKentuckyMD  Active   potassium chloride SA (KLOR-CON M) 20 MEQ tablet [id number] Take 1 tablet (20 mEq total) by mouth once daily [provider] Taking Active               Assessment/Plan:   Encourage patient to restart using weekly pillbox to organize his medications  Diabetes: - Currently uncontrolled - Patient to contact pharmacy for refill of Tresiba Rx - Have counseled on monitoring for symptoms of hypoglycemia and how to manage lows Note patient carries soft peppermint candies with him to use as rapid source of sugar if needed for lows - Encouraged patient to monitor and contact provider if needed for readings outside established parameters or any symptoms of hypoglycemia  Hypertension: - Patient to contact pharmacy for  refill of lisinopril 5 mg Rx as prescribed by Oncology team - Patient to restart monitoring home blood pressure, keep log of results and bring record to upcoming medical appointments  Agree to follow up again in 2 weeks to review medications, blood pressure readings and blood sugar readings     Follow Up Plan: Clinical Pharmacist will outreach to patient by telephone on 04/11/2022 at 3:15 pm  ElWallace CullensPharmD, BCMetz Medical CenteroElk [phone]

## 2022-04-02 ENCOUNTER — Ambulatory Visit: Payer: BC Managed Care – PPO | Admitting: Pharmacist

## 2022-04-02 DIAGNOSIS — E1165 Type 2 diabetes mellitus with hyperglycemia: Secondary | ICD-10-CM

## 2022-04-02 NOTE — Progress Notes (Signed)
04/02/2022 Name: Anthony Bell MRN: [id number] DOB: 11-May-1955  Chief Complaint  Patient presents with   Medication Assistance    Anthony Bell is a 67 y.o. year old male referred to the pharmacist by their PCP for assistance in managing diabetes, hypertension, hyperlipidemia, and medication access.   Today receive a voicemail message from patient requesting a call back. Return call to patient   Subjective:  Care Team: Primary Care Provider: [name], NP ; Next Scheduled Visit: 04/20/2022 Oncology: [facility]; Next Scheduled Visit: 04/05/2022  Medication Access/Adherence  [facility]  [id number] [address]Phone: [phone]: [address]Phone: [phone]: [phone]  Patient reports affordability concerns with their medications: Yes  Patient reports access/transportation concerns to their pharmacy: No  Patient reports adherence concerns with their medications:  No    Today patient reports cost of his TTyler Aasinsulin has increased and requests assistance from Clinical Pharmacist.  - Outreach to CBonner Springstoday on behalf of patient. [facility] with [facility] to review claim details and confirm TTyler AasRx being processed through both patient's insurance and manufacturer copayment card. [facility] confirms and advises cost for current Rx is $105, even with copay card, due to patient's annual deductible - From review of insurance card in chart, note patient has an annual $1000 deductible - Follow up with patient to provide this update     Objective: Lab Results  Component Value Date   HGBA1C 9.6 01/18/2022    Lab Results  Component Value Date   CREATININE 1.54 (H) 05/08/2021   BUN 30 (H) 05/08/2021   NA 137 05/08/2021   K 3.0 (L) 05/08/2021   CL 99 05/08/2021   CO2 28  05/08/2021     Medications    Reviewed by DRennis Petty RPH-CPP (Pharmacist) on 03/28/22 at 1531  Med List Status: <None>   Medication Order Taking? Sig Documenting Provider Last Dose Status Informant  amLODipine (NORVASC) 5 MG tablet [id number] Take 1 tablet (5 mg total) by mouth daily. BJearld Fenton NP Taking Active   aspirin EC 81 MG tablet [id number] Take 81 mg by mouth daily. Swallow whole.  Patient not taking: Reported on 03/28/2022   [provider] Not Taking Active   atorvastatin (LIPITOR) 20 MG tablet [id number] Take 1 tablet (20 mg total) by mouth daily. BJearld Fenton NP Taking Active   blood glucose meter kit and supplies KIT [id number] Dispense based on patient and insurance preference. Use up to four times daily as directed. 100 strips and lancets please WKathrine Haddock NP  Active   Blood Glucose Monitoring Suppl (CONTOUR NEXT EZ) w/Device KIT [id number] USE UP TO FOUR TIMES DAILY AS DIRECTED. (FOR ICD-10 E10.9, E11.9). KOlin Hauser DO  Active   CONTOUR NEXT TEST test strip [id number] USE AS DIRECTED UPTO 4 TIMES A DRonne Binning NP  Active   dexamethasone (DECADRON) 4 MG tablet [id number] Take '8mg'$  (2 x '4mg'$  tablets) by mouth in the morning for 2 days after oxaliplatin chemotherapy, then as directed. [provider]  Active   empagliflozin (JARDIANCE) 10 MG TABS tablet [id number] Take 1 tablet (10 mg total) by mouth daily before breakfast. BJearld Fenton NP Taking Active   insulin degludec (TRESIBA) 100 UNIT/ML FlexTouch Pen [id number]  Yes Inject 20 Units into the skin daily.  Patient taking differently: Inject 24 Units into the skin daily.   [name], NP Taking Active   Insulin Pen Needle 31G X 5 MM MISC [id number]  BD Pen Needles- brand specific Inject insulin via insulin pen 6 x daily [name], NP  Active   magnesium oxide (MAG-OX) 400 MG tablet [id number] Yes Take 2 tablets (800 mg total) by mouth once  daily [provider] Taking Active   metFORMIN (GLUCOPHAGE) 1000 MG tablet [id number] Yes Take 1 tablet (1,000 mg total) by mouth 2 (two) times daily with a meal. TAKE 1 TABLET (1,000 MG TOTAL) BY MOUTH TWICE A DAY WITH A MEAL Strength: 1,000 mg Baity, Regina W, NP Taking Active   ondansetron (ZOFRAN-ODT) 4 MG disintegrating tablet [id number]  Take 1 tablet (4 mg total) by mouth every 8 (eight) hours as needed for nausea or vomiting. [name], [place], MD  Active   potassium chloride SA (KLOR-CON M) 20 MEQ tablet [id number] Yes Take 1 tablet (20 mEq total) by mouth once daily [provider] Taking Active               Assessment/Plan:   Diabetes: - Currently uncontrolled - Patient to pick up [place] Rx and continue taking as directed - Have encouraged patient to monitor and contact provider if needed for readings outside established parameters or any symptoms of hypoglycemia    Follow Up Plan: Clinical Pharmacist will outreach to patient by telephone on 04/11/2022 at 3:15 pm   Wallace Cullens, PharmD, [place] [phone]

## 2022-04-02 NOTE — Patient Instructions (Signed)
Goals Addressed             This Visit's Progress    Pharmacy Goals       Our goal A1c is less than 7%. This corresponds with fasting sugars less than 130 and 2 hour after meal sugars less than 180. Please check your fasting blood sugar and keep a log of the results  Check your blood pressure twice weekly, and any time you have concerning symptoms like headache, chest pain, dizziness, shortness of breath, or vision changes.    To appropriately check your blood pressure, make sure you do the following:  1) Avoid caffeine, exercise, or tobacco products for 30 minutes before checking. Empty your bladder. 2) Sit with your back supported in a flat-backed chair. Rest your arm on something flat (arm of the chair, table, etc). 3) Sit still with your feet flat on the floor, resting, for at least 5 minutes.  4) Check your blood pressure. Take 1-2 readings.  5) Write down these readings and bring with you to any provider appointments.  Bring your home blood pressure machine with you to a provider's office for accuracy comparison at least once a year.   Make sure you take your blood pressure medications before you come to any office visit, even if you were asked to fast for labs.   Feel free to call me with any questions or concerns. I look forward to our next call!  Jelan Batterton Deriona Altemose, PharmD, BCACP Clinical Pharmacist [facility] [place] [phone]        

## 2022-04-05 DIAGNOSIS — E876 Hypokalemia: Secondary | ICD-10-CM | POA: Diagnosis not present

## 2022-04-05 DIAGNOSIS — Z5112 Encounter for antineoplastic immunotherapy: Secondary | ICD-10-CM | POA: Diagnosis not present

## 2022-04-05 DIAGNOSIS — F17210 Nicotine dependence, cigarettes, uncomplicated: Secondary | ICD-10-CM | POA: Diagnosis not present

## 2022-04-05 DIAGNOSIS — N1330 Unspecified hydronephrosis: Secondary | ICD-10-CM | POA: Diagnosis not present

## 2022-04-05 DIAGNOSIS — Z5111 Encounter for antineoplastic chemotherapy: Secondary | ICD-10-CM | POA: Diagnosis not present

## 2022-04-05 DIAGNOSIS — C20 Malignant neoplasm of rectum: Secondary | ICD-10-CM | POA: Diagnosis not present

## 2022-04-05 DIAGNOSIS — Z79899 Other long term (current) drug therapy: Secondary | ICD-10-CM | POA: Diagnosis not present

## 2022-04-11 ENCOUNTER — Ambulatory Visit: Payer: BC Managed Care – PPO | Admitting: Pharmacist

## 2022-04-11 DIAGNOSIS — E1165 Type 2 diabetes mellitus with hyperglycemia: Secondary | ICD-10-CM

## 2022-04-11 DIAGNOSIS — I10 Essential (primary) hypertension: Secondary | ICD-10-CM

## 2022-04-11 NOTE — Patient Instructions (Signed)
Goals Addressed             This Visit's Progress    Pharmacy Goals       Our goal A1c is less than 7%. This corresponds with fasting sugars less than 130 and 2 hour after meal sugars less than 180. Please check your fasting blood sugar and keep a log of the results  Check your blood pressure twice weekly, and any time you have concerning symptoms like headache, chest pain, dizziness, shortness of breath, or vision changes.    To appropriately check your blood pressure, make sure you do the following:  1) Avoid caffeine, exercise, or tobacco products for 30 minutes before checking. Empty your bladder. 2) Sit with your back supported in a flat-backed chair. Rest your arm on something flat (arm of the chair, table, etc). 3) Sit still with your feet flat on the floor, resting, for at least 5 minutes.  4) Check your blood pressure. Take 1-2 readings.  5) Write down these readings and bring with you to any provider appointments.  Bring your home blood pressure machine with you to a provider's office for accuracy comparison at least once a year.   Make sure you take your blood pressure medications before you come to any office visit, even if you were asked to fast for labs.   Feel free to call me with any questions or concerns. I look forward to our next call!  Tashawnda Bleiler Maci Eickholt, PharmD, BCACP Clinical Pharmacist [facility] [place] [phone]        

## 2022-04-11 NOTE — Progress Notes (Signed)
04/11/2022 Name: Anthony Bell MRN: [id number] DOB: 05-21-1955  Chief Complaint  Patient presents with   Medication Management    Patient Phone Call    Anthony Bell is a 67 y.o. year old male who presented for a telephone visit.   They were referred to the pharmacist by their PCP for assistance in managing diabetes, hypertension, hyperlipidemia, and medication access.    Subjective:  Care Team: Primary Care Provider: [name], NP ; Next Scheduled Visit: 05/01/2022 Oncology: [facility]; Next Scheduled Visit: 04/19/2022  Medication Access/Adherence  [facility]  [id number] [address]Phone: [phone]: [address]Phone: [phone]: [phone]  Patient reports affordability concerns with their medications: No  Patient reports access/transportation concerns to their pharmacy: No  Patient reports adherence concerns with their medications:  No     T2DM: Uncontrolled; current treatment: Metformin 1000 mg twice daily with meals Jardiance 10 mg daily before breakfast Tresiba 24 units daily Patient recalls recent morning fasting readings ranging 73-102 Patient denies hypoglycemic s/sx including dizziness, shakiness, sweating  Reports at latest Oncology visit on 1/11, provider advised will re-evaluate patient's blood sugar again at next visit on 1/25 and consider reducing patient's Tresiba dose at that time   Current medication access support: Uses copayment card from manufacturers for [employer]     Hypertension: Current treatment: amlodipine 5 mg daily lisinopril 5 mg daily   Denies having blood pressure monitored at home or work recently, but reports will have the nurse at work check this tomorrow and log the reading  Denies symptoms of  hypotension  Denies adding salt to his food Reports reviews nutrition labels for sodium content   Objective:  Lab Results  Component Value Date   HGBA1C 9.6 01/18/2022    Lab Results  Component Value Date   CREATININE 1.54 (H) 05/08/2021   BUN 30 (H) 05/08/2021   NA 137 05/08/2021   K 3.0 (L) 05/08/2021   CL 99 05/08/2021   CO2 28 05/08/2021    Lab Results  Component Value Date   CHOL 123 03/30/2021   CHOL 127 03/30/2021   HDL 33 (L) 03/30/2021   HDL 36 (L) 03/30/2021   LDLCALC 68 03/30/2021   LDLCALC 70 03/30/2021   TRIG 135 03/30/2021   TRIG 130 03/30/2021   CHOLHDL 3.7 03/30/2021   CHOLHDL 3.5 03/30/2021   BP Readings from Last 3 Encounters:  01/18/22 134/72  05/08/21 121/78  03/30/21 (!) 131/58   Pulse Readings from Last 3 Encounters:  01/18/22 70  05/08/21 66  03/30/21 74    Medications Reviewed Today     Reviewed by DRennis Petty RPH-CPP (Pharmacist) on 03/28/22 at 1531  Med List Status: <None>   Medication Order Taking? Sig Documenting Provider Last Dose Status Informant  amLODipine (NORVASC) 5 MG tablet [id number] Take 1 tablet (5 mg total) by mouth daily. BJearld Fenton NP Taking Active   aspirin EC 81 MG tablet [id number] Take 81 mg by mouth daily. Swallow whole.  Patient not taking: Reported on 03/28/2022   [provider] Not Taking Active   atorvastatin (LIPITOR) 20 MG tablet [id number] Take 1 tablet (20 mg total) by mouth daily. BJearld Fenton NP Taking Active   blood glucose meter kit and supplies KIT [id number]  Dispense based on patient and insurance preference. Use up to four times daily as directed. 100 strips and lancets please [name], NP  Active   Blood Glucose Monitoring Suppl (CONTOUR NEXT EZ) w/Device KIT [id number]  USE UP TO FOUR TIMES DAILY AS DIRECTED. (FOR ICD-10 E10.9, E11.9). [name], DO  Active   CONTOUR NEXT TEST test strip [id number]  USE AS DIRECTED UPTO 4 TIMES A [name], NP  Active   dexamethasone (DECADRON) 4 MG tablet [id number]  Take '8mg'$  (2 x '4mg'$  tablets) by mouth in the morning for 2 days after oxaliplatin chemotherapy, then as directed. [provider]  Active   empagliflozin (JARDIANCE) 10 MG TABS tablet [id number] Yes Take 1 tablet (10 mg total) by mouth daily before breakfast. [name], NP Taking Active   insulin degludec (TRESIBA) 100 UNIT/ML FlexTouch Pen [id number] Yes Inject 20 Units into the skin daily.  Patient taking differently: Inject 24 Units into the skin daily.   [name], NP Taking Active   Insulin Pen Needle 31G X 5 MM MISC [id number]  BD Pen Needles- brand specific Inject insulin via insulin pen 6 x daily [name], NP  Active   magnesium oxide (MAG-OX) 400 MG tablet [id number] Yes Take 2 tablets (800 mg total) by mouth once daily [provider] Taking Active   metFORMIN (GLUCOPHAGE) 1000 MG tablet [id number] Yes Take 1 tablet (1,000 mg total) by mouth 2 (two) times daily with a meal. TAKE 1 TABLET (1,000 MG TOTAL) BY MOUTH TWICE A DAY WITH A MEAL Strength: 1,000 mg Baity, Regina W, NP Taking Active   ondansetron (ZOFRAN-ODT) 4 MG disintegrating tablet [id number]  Take 1 tablet (4 mg total) by mouth every 8 (eight) hours as needed for nausea or vomiting. [name], [place], MD  Active   potassium chloride SA (KLOR-CON M) 20 MEQ tablet [id number] Yes Take 1 tablet (20 mEq total) by mouth once daily [provider] Taking Active               Assessment/Plan:   Diabetes: - Currently uncontrolled - Patient to pick up [place] Rx refill from pharmacy - Have counseled on monitoring for symptoms of hypoglycemia and how to manage lows Note patient carries soft peppermint candies with him to use as rapid source of sugar if needed for lows - Encouraged patient to monitor, keep log of results, bring this record with him to his medical appointments and to contact provider if needed for readings  outside established parameters or any symptoms of hypoglycemia   Hypertension: - Patient to have blood pressure monitored as able at home/work, keep log of results and bring record to upcoming medical appointments       Follow Up Plan: Clinical Pharmacist will outreach to patient by telephone on 06/11/2022 at 3:15 pm   Wallace Cullens, PharmD, [facility] [place] [phone]

## 2022-04-19 DIAGNOSIS — Z5112 Encounter for antineoplastic immunotherapy: Secondary | ICD-10-CM | POA: Diagnosis not present

## 2022-04-19 DIAGNOSIS — C787 Secondary malignant neoplasm of liver and intrahepatic bile duct: Secondary | ICD-10-CM | POA: Diagnosis not present

## 2022-04-19 DIAGNOSIS — N1330 Unspecified hydronephrosis: Secondary | ICD-10-CM | POA: Diagnosis not present

## 2022-04-19 DIAGNOSIS — C20 Malignant neoplasm of rectum: Secondary | ICD-10-CM | POA: Diagnosis not present

## 2022-04-19 DIAGNOSIS — Z5111 Encounter for antineoplastic chemotherapy: Secondary | ICD-10-CM | POA: Diagnosis not present

## 2022-04-19 DIAGNOSIS — F17210 Nicotine dependence, cigarettes, uncomplicated: Secondary | ICD-10-CM | POA: Diagnosis not present

## 2022-04-19 DIAGNOSIS — Z79899 Other long term (current) drug therapy: Secondary | ICD-10-CM | POA: Diagnosis not present

## 2022-04-19 DIAGNOSIS — Z436 Encounter for attention to other artificial openings of urinary tract: Secondary | ICD-10-CM | POA: Diagnosis not present

## 2022-04-20 ENCOUNTER — Encounter: Payer: BC Managed Care – PPO | Admitting: Internal Medicine

## 2022-05-01 ENCOUNTER — Ambulatory Visit (INDEPENDENT_AMBULATORY_CARE_PROVIDER_SITE_OTHER): Payer: BC Managed Care – PPO | Admitting: Internal Medicine

## 2022-05-01 DIAGNOSIS — Z91199 Patient's noncompliance with other medical treatment and regimen due to unspecified reason: Secondary | ICD-10-CM

## 2022-05-01 NOTE — Progress Notes (Unsigned)
   Subjective:    Patient ID: Anthony Bell, male    DOB: 15-Feb-1956, 67 y.o.   MRN: [id number]  Pt no showed for appt Webb Silversmith, NP

## 2022-05-03 DIAGNOSIS — Z79899 Other long term (current) drug therapy: Secondary | ICD-10-CM | POA: Diagnosis not present

## 2022-05-03 DIAGNOSIS — Z7952 Long term (current) use of systemic steroids: Secondary | ICD-10-CM | POA: Diagnosis not present

## 2022-05-03 DIAGNOSIS — C7801 Secondary malignant neoplasm of right lung: Secondary | ICD-10-CM | POA: Diagnosis not present

## 2022-05-03 DIAGNOSIS — Z7982 Long term (current) use of aspirin: Secondary | ICD-10-CM | POA: Diagnosis not present

## 2022-05-03 DIAGNOSIS — C787 Secondary malignant neoplasm of liver and intrahepatic bile duct: Secondary | ICD-10-CM | POA: Diagnosis not present

## 2022-05-03 DIAGNOSIS — C19 Malignant neoplasm of rectosigmoid junction: Secondary | ICD-10-CM | POA: Diagnosis not present

## 2022-05-03 DIAGNOSIS — G629 Polyneuropathy, unspecified: Secondary | ICD-10-CM | POA: Diagnosis not present

## 2022-05-03 DIAGNOSIS — Z7984 Long term (current) use of oral hypoglycemic drugs: Secondary | ICD-10-CM | POA: Diagnosis not present

## 2022-05-03 DIAGNOSIS — Z5111 Encounter for antineoplastic chemotherapy: Secondary | ICD-10-CM | POA: Diagnosis not present

## 2022-05-03 DIAGNOSIS — C7802 Secondary malignant neoplasm of left lung: Secondary | ICD-10-CM | POA: Diagnosis not present

## 2022-05-03 DIAGNOSIS — C20 Malignant neoplasm of rectum: Secondary | ICD-10-CM | POA: Diagnosis not present

## 2022-05-03 DIAGNOSIS — C778 Secondary and unspecified malignant neoplasm of lymph nodes of multiple regions: Secondary | ICD-10-CM | POA: Diagnosis not present

## 2022-05-03 DIAGNOSIS — Z8719 Personal history of other diseases of the digestive system: Secondary | ICD-10-CM | POA: Diagnosis not present

## 2022-05-03 DIAGNOSIS — F17210 Nicotine dependence, cigarettes, uncomplicated: Secondary | ICD-10-CM | POA: Diagnosis not present

## 2022-05-03 DIAGNOSIS — Z5112 Encounter for antineoplastic immunotherapy: Secondary | ICD-10-CM | POA: Diagnosis not present

## 2022-05-03 DIAGNOSIS — I10 Essential (primary) hypertension: Secondary | ICD-10-CM | POA: Diagnosis not present

## 2022-05-04 ENCOUNTER — Encounter: Payer: Self-pay | Admitting: Internal Medicine

## 2022-05-04 ENCOUNTER — Ambulatory Visit: Payer: BC Managed Care – PPO | Admitting: Pharmacist

## 2022-05-04 ENCOUNTER — Ambulatory Visit (INDEPENDENT_AMBULATORY_CARE_PROVIDER_SITE_OTHER): Payer: BC Managed Care – PPO | Admitting: Internal Medicine

## 2022-05-04 VITALS — BP 148/68 | HR 72 | Temp 96.8°F | Wt 176.0 lb

## 2022-05-04 DIAGNOSIS — E663 Overweight: Secondary | ICD-10-CM | POA: Diagnosis not present

## 2022-05-04 DIAGNOSIS — Z0001 Encounter for general adult medical examination with abnormal findings: Secondary | ICD-10-CM | POA: Diagnosis not present

## 2022-05-04 DIAGNOSIS — E1165 Type 2 diabetes mellitus with hyperglycemia: Secondary | ICD-10-CM

## 2022-05-04 DIAGNOSIS — Z125 Encounter for screening for malignant neoplasm of prostate: Secondary | ICD-10-CM

## 2022-05-04 DIAGNOSIS — Z6827 Body mass index (BMI) 27.0-27.9, adult: Secondary | ICD-10-CM

## 2022-05-04 DIAGNOSIS — I10 Essential (primary) hypertension: Secondary | ICD-10-CM

## 2022-05-04 MED ORDER — LISINOPRIL 5 MG PO TABS: 5.0000 mg | ORAL_TABLET | Freq: Every day | ORAL | 1 refills | Status: DC

## 2022-05-04 MED ORDER — ONDANSETRON 4 MG PO TBDP: 4.0000 mg | ORAL_TABLET | Freq: Three times a day (TID) | ORAL | 0 refills | Status: DC | PRN

## 2022-05-04 MED ORDER — AMLODIPINE BESYLATE 5 MG PO TABS: 10.0000 mg | ORAL_TABLET | Freq: Every day | ORAL | 0 refills | Status: DC

## 2022-05-04 MED ORDER — MAGNESIUM OXIDE 400 MG PO TABS: ORAL_TABLET | ORAL | 1 refills | Status: DC

## 2022-05-04 NOTE — Assessment & Plan Note (Signed)
Will increase amlodipine to 10 mg daily Continue lisinopril Reinforced DASH diet and exercise for weight loss

## 2022-05-04 NOTE — Patient Instructions (Signed)
[facility] your Tresiba refill.   Thank you,  Wallace Cullens, PharmD, [facility] [phone]

## 2022-05-04 NOTE — Progress Notes (Signed)
Subjective:    Patient ID: Montez Morita, male    DOB: 28-Nov-1955, 67 y.o.   MRN: [id number]  HPI  Patient presents to clinic today for his annual exam.  Flu: 01/2022 Tetanus: 03/2016 Pneumovax: 07/2017 COVID: Pfizer x 2 Shingrix: Never PSA screening: >2 years ago Colon screening: 03/2020 Vision screening: as needed Dentist: as needed  Diet: He does eat meat. He consumes fruits and veggies. He consumes some fried foods. He drinks mostly water, soda. Exercise: None   Review of Systems     Past Medical History:  Diagnosis Date   Colon polyps    Diabetes mellitus without complication (HCC)    GERD (gastroesophageal reflux disease)    Hypertension    Rectal cancer (HCC)    [facility]    Current Outpatient Medications  Medication Sig Dispense Refill   amLODipine (NORVASC) 5 MG tablet Take 1 tablet (5 mg total) by mouth daily. 90 tablet 0   aspirin EC 81 MG tablet Take 81 mg by mouth daily. Swallow whole. (Patient not taking: Reported on 03/28/2022)     atorvastatin (LIPITOR) 20 MG tablet Take 1 tablet (20 mg total) by mouth daily. 90 tablet 0   blood glucose meter kit and supplies KIT Dispense based on patient and insurance preference. Use up to four times daily as directed. 100 strips and lancets please 1 each 0   Blood Glucose Monitoring Suppl (CONTOUR NEXT EZ) w/Device KIT USE UP TO FOUR TIMES DAILY AS DIRECTED. (FOR ICD-10 E10.9, E11.9). 1 kit 0   CONTOUR NEXT TEST test strip USE AS DIRECTED UPTO 4 TIMES A DAY 100 strip 5   dexamethasone (DECADRON) 4 MG tablet Take 77m (2 x 462mtablets) by mouth in the morning for 2 days after oxaliplatin chemotherapy, then as directed.     empagliflozin (JARDIANCE) 10 MG TABS tablet Take 1 tablet (10 mg total) by mouth daily before breakfast. 90 tablet 0   insulin degludec (TRESIBA) 100 UNIT/ML FlexTouch Pen Inject 20 Units into the skin daily. (Patient taking differently: Inject 24 Units into the skin daily.) 15 mL 1   Insulin Pen Needle 31G X  5 MM MISC BD Pen Needles- brand specific Inject insulin via insulin pen 6 x daily 200 each 3   lisinopril (ZESTRIL) 5 MG tablet Take 5 mg by mouth daily.     magnesium oxide (MAG-OX) 400 MG tablet Take 2 tablets (800 mg total) by mouth once daily     metFORMIN (GLUCOPHAGE) 1000 MG tablet Take 1 tablet (1,000 mg total) by mouth 2 (two) times daily with a meal. TAKE 1 TABLET (1,000 MG TOTAL) BY MOUTH TWICE A DAY WITH A MEAL Strength: 1,000 mg 180 tablet 0   ondansetron (ZOFRAN-ODT) 4 MG disintegrating tablet Take 1 tablet (4 mg total) by mouth every 8 (eight) hours as needed for nausea or vomiting. 20 tablet 0   potassium chloride SA (KLOR-CON M) 20 MEQ tablet Take 1 tablet (20 mEq total) by mouth once daily     No current facility-administered medications for this visit.    No Known Allergies  Family History  Problem Relation Age of Onset   Diabetes Mother    Cancer Father    Diabetes Father    Diabetes Sister    Heart attack Neg Hx    Stroke Neg Hx     Social History   Socioeconomic History   Marital status: Married    Spouse name: Not on file   Number of children: Not  on file   Years of education: Not on file   Highest education level: Not on file  Occupational History   Not on file  Tobacco Use   Smoking status: Every Day    Packs/day: 1.00    Years: 40.00    Total pack years: 40.00    Types: Cigarettes   Smokeless tobacco: Never  Vaping Use   Vaping Use: Never used  Substance and Sexual Activity   Alcohol use: No   Drug use: No   Sexual activity: Not on file  Other Topics Concern   Not on file  Social History Narrative   Not on file   Social Determinants of Health   Financial Resource Strain: Low Risk  (01/12/2021)   Overall Financial Resource Strain (CARDIA)    Difficulty of Paying Living Expenses: Not hard at all  Food Insecurity: No Food Insecurity (01/12/2021)   Hunger Vital Sign    Worried About Running Out of Food in the Last Year: Never true    [place] in the Last Year: Never true  Transportation Needs: No Transportation Needs (01/12/2021)   PRAPARE - Hydrologist (Medical): No    Lack of Transportation (Non-Medical): No  Physical Activity: Inactive (01/12/2021)   Exercise Vital Sign    Days of Exercise per Week: 0 days    Minutes of Exercise per Session: 0 min  Stress: No Stress Concern Present (01/12/2021)   [place]    Feeling of Stress : Not at all  Social Connections: Moderately Isolated (01/12/2021)   Social Connection and Isolation Panel [NHANES]    Frequency of Communication with Friends and Family: More than three times a week    Frequency of Social Gatherings with Friends and Family: More than three times a week    Attends Religious Services: Never    Marine scientist or Organizations: No    Attends Archivist Meetings: Never    Marital Status: Married  Human resources officer Violence: Not At Risk (01/12/2021)   Humiliation, Afraid, Rape, and Kick questionnaire    Fear of Current or Ex-Partner: No    Emotionally Abused: No    Physically Abused: No    Sexually Abused: No     Constitutional: Denies fever, malaise, fatigue, headache or abrupt weight changes.  HEENT: Denies eye pain, eye redness, ear pain, ringing in the ears, wax buildup, runny nose, nasal congestion, bloody nose, or sore throat. Respiratory: Denies difficulty breathing, shortness of breath, cough or sputum production.   Cardiovascular: Denies chest pain, chest tightness, palpitations or swelling in the hands or feet.  Gastrointestinal: Patient reports intermittent nausea.  Denies abdominal pain, bloating, constipation, diarrhea or blood in the stool.  GU: Patient reports erectile dysfunction.  Denies urgency, frequency, pain with urination, burning sensation, blood in urine, odor or discharge. Musculoskeletal: Denies decrease in range of  motion, difficulty with gait, muscle pain or joint pain and swelling.  Skin: Denies redness, rashes, lesions or ulcercations.  Neurological: Denies dizziness, difficulty with memory, difficulty with speech or problems with balance and coordination.  Psych: Denies anxiety, depression, SI/HI.  No other specific complaints in a complete review of systems (except as listed in HPI above).  Objective:   Physical Exam   BP (!) 148/68   Pulse 72   Temp (!) 96.8 F (36 C) (Temporal)   Wt 176 lb (79.8 kg)   SpO2 100%  BMI 27.57 kg/m   Wt Readings from Last 3 Encounters:  01/18/22 171 lb (77.6 kg)  05/08/21 197 lb (89.4 kg)  03/30/21 193 lb 9.6 oz (87.8 kg)    General: Appears his stated age, overweight in NAD. Skin: Warm, dry and intact. No ulcerations noted. HEENT: Head: normal shape and size; Eyes: sclera white, no icterus, conjunctiva pink, PERRLA and EOMs intact;  Neck:  Neck supple, trachea midline. No masses, lumps or thyromegaly present.  Cardiovascular: Normal rate and rhythm. S1,S2 noted.  No murmur, rubs or gallops noted. No JVD or BLE edema. No carotid bruits noted. Pulmonary/Chest: Normal effort and positive vesicular breath sounds. No respiratory distress. No wheezes, rales or ronchi noted.  Abdomen:  Normal bowel sounds.  Musculoskeletal: Strength 5/5 BUE/BLE.  No difficulty with gait.  Neurological: Alert and oriented. Cranial nerves II-XII grossly intact. Coordination normal.  Psychiatric: Mood and affect normal. Behavior is normal. Judgment and thought content normal.    BMET    Component Value Date/Time   NA 137 05/08/2021 0110   NA 144 10/10/2011 1615   K 3.0 (L) 05/08/2021 0110   K 4.3 10/10/2011 1615   CL 99 05/08/2021 0110   CL 104 10/10/2011 1615   CO2 28 05/08/2021 0110   CO2 35 (H) 10/10/2011 1615   GLUCOSE 142 (H) 05/08/2021 0110   GLUCOSE 106 (H) 10/10/2011 1615   BUN 30 (H) 05/08/2021 0110   BUN 17 10/10/2011 1615   CREATININE 1.54 (H)  05/08/2021 0110   CREATININE 1.32 03/30/2021 1550   CALCIUM 9.0 05/08/2021 0110   CALCIUM 9.2 10/10/2011 1615   GFRNONAA 50 (L) 05/08/2021 0110   GFRNONAA 60 09/20/2020 0750   GFRAA 70 09/20/2020 0750    Lipid Panel     Component Value Date/Time   CHOL 123 03/30/2021 1550   CHOL 127 03/30/2021 1550   TRIG 135 03/30/2021 1550   TRIG 130 03/30/2021 1550   HDL 33 (L) 03/30/2021 1550   HDL 36 (L) 03/30/2021 1550   CHOLHDL 3.7 03/30/2021 1550   CHOLHDL 3.5 03/30/2021 1550   LDLCALC 68 03/30/2021 1550   LDLCALC 70 03/30/2021 1550    CBC    Component Value Date/Time   WBC 10.7 (H) 05/08/2021 0110   RBC 6.31 (H) 05/08/2021 0110   HGB 17.0 05/08/2021 0110   HGB 14.9 10/10/2011 1615   HCT 52.3 (H) 05/08/2021 0110   HCT 46.9 10/10/2011 1615   PLT 201 05/08/2021 0110   PLT 159 10/10/2011 1615   MCV 82.9 05/08/2021 0110   MCV 87 10/10/2011 1615   MCH 26.9 05/08/2021 0110   MCHC 32.5 05/08/2021 0110   RDW 15.8 (H) 05/08/2021 0110   RDW 14.3 10/10/2011 1615   LYMPHSABS 1,448 06/02/2020 0958   EOSABS 193 06/02/2020 0958   BASOSABS 31 06/02/2020 0958    Hgb A1C Lab Results  Component Value Date   HGBA1C 9.6 01/18/2022           Assessment & Plan:    Preventative Health Maintenance:  Flu shot UTD per his report Tetanus UTD Encouraged him to get his COVID booster Pneumovax UTD Discussed Shingrix vaccine, he will check coverage with his insurance company and schedule visit if he would like to have this done Colon screening UTD CT low-dose lung cancer screening declined Encouraged him consume a balanced diet and exercise regimen Advised him to see an eye doctor and dentist annually We will check CBC, c-Met, lipid, A1c, urine microalbumin and PSA today  RTC in 3 months, follow-up chronic conditions Webb Silversmith, NP

## 2022-05-04 NOTE — Assessment & Plan Note (Signed)
Encouraged diet and exercise for weight loss ?

## 2022-05-04 NOTE — Patient Instructions (Signed)
Health Maintenance, Male Adopting a healthy lifestyle and getting preventive care are important in promoting health and wellness. Ask your health care provider about: The right schedule for you to have regular tests and exams. Things you can do on your own to prevent diseases and keep yourself healthy. What should I know about diet, weight, and exercise? Eat a healthy diet  Eat a diet that includes plenty of vegetables, fruits, low-fat dairy products, and lean protein. Do not eat a lot of foods that are high in solid fats, added sugars, or sodium. Maintain a healthy weight Body mass index (BMI) is a measurement that can be used to identify possible weight problems. It estimates body fat based on height and weight. Your health care provider can help determine your BMI and help you achieve or maintain a healthy weight. Get regular exercise Get regular exercise. This is one of the most important things you can do for your health. Most adults should: Exercise for at least 150 minutes each week. The exercise should increase your heart rate and make you sweat (moderate-intensity exercise). Do strengthening exercises at least twice a week. This is in addition to the moderate-intensity exercise. Spend less time sitting. Even light physical activity can be beneficial. Watch cholesterol and blood lipids Have your blood tested for lipids and cholesterol at 65 years of age, then have this test every 5 years. You may need to have your cholesterol levels checked more often if: Your lipid or cholesterol levels are high. You are older than 65 years of age. You are at high risk for heart disease. What should I know about cancer screening? Many types of cancers can be detected early and may often be prevented. Depending on your health history and family history, you may need to have cancer screening at various ages. This may include screening for: Colorectal cancer. Prostate cancer. Skin cancer. Lung  cancer. What should I know about heart disease, diabetes, and high blood pressure? Blood pressure and heart disease High blood pressure causes heart disease and increases the risk of stroke. This is more likely to develop in people who have high blood pressure readings or are overweight. Talk with your health care provider about your target blood pressure readings. Have your blood pressure checked: Every 3-5 years if you are 18-39 years of age. Every year if you are 40 years old or older. If you are between the ages of 65 and 75 and are a current or former smoker, ask your health care provider if you should have a one-time screening for abdominal aortic aneurysm (AAA). Diabetes Have regular diabetes screenings. This checks your fasting blood sugar level. Have the screening done: Once every three years after age 45 if you are at a normal weight and have a low risk for diabetes. More often and at a younger age if you are overweight or have a high risk for diabetes. What should I know about preventing infection? Hepatitis B If you have a higher risk for hepatitis B, you should be screened for this virus. Talk with your health care provider to find out if you are at risk for hepatitis B infection. Hepatitis C Blood testing is recommended for: Everyone born from 1945 through 1965. Anyone with known risk factors for hepatitis C. Sexually transmitted infections (STIs) You should be screened each year for STIs, including gonorrhea and chlamydia, if: You are sexually active and are younger than 65 years of age. You are older than 65 years of age and your   health care provider tells you that you are at risk for this type of infection. Your sexual activity has changed since you were last screened, and you are at increased risk for chlamydia or gonorrhea. Ask your health care provider if you are at risk. Ask your health care provider about whether you are at high risk for HIV. Your health care provider  may recommend a prescription medicine to help prevent HIV infection. If you choose to take medicine to prevent HIV, you should first get tested for HIV. You should then be tested every 3 months for as long as you are taking the medicine. Follow these instructions at home: Alcohol use Do not drink alcohol if your health care provider tells you not to drink. If you drink alcohol: Limit how much you have to 0-2 drinks a day. Know how much alcohol is in your drink. In the [place] one drink equals one 12 oz bottle of beer (355 mL), one 5 oz glass of wine (148 mL), or one 1 oz glass of hard liquor (44 mL). Lifestyle Do not use any products that contain nicotine or tobacco. These products include cigarettes, chewing tobacco, and vaping devices, such as e-cigarettes. If you need help quitting, ask your health care provider. Do not use street drugs. Do not share needles. Ask your health care provider for help if you need support or information about quitting drugs. General instructions Schedule regular health, dental, and eye exams. Stay current with your vaccines. Tell your health care provider if: You often feel depressed. You have ever been abused or do not feel safe at home. Summary Adopting a healthy lifestyle and getting preventive care are important in promoting health and wellness. Follow your health care provider's instructions about healthy diet, exercising, and getting tested or screened for diseases. Follow your health care provider's instructions on monitoring your cholesterol and blood pressure. This information is not intended to replace advice given to you by your health care provider. Make sure you discuss any questions you have with your health care provider. Document Revised: 08/01/2020 Document Reviewed: 08/01/2020 Elsevier Patient Education  2023 Elsevier Inc.  

## 2022-05-04 NOTE — Progress Notes (Signed)
   05/04/2022 Name: Anthony Bell MRN: [id number] DOB: 08-25-1955  Chief Complaint  Patient presents with   Medication Assistance    Kerri Eick is a 67 y.o. year old male who was referred to the pharmacist by their PCP for assistance in managing diabetes, hypertension, hyperlipidemia, and medication access.    Today receive a message from PCP requesting assistance to patient with the cost of his insulin and advising patient has only ~1 week supply of his [name] remaining  Note earlier in calendar year counseled patient on his annual prescription/medical deductible, how his medical visits impact this and on the expected cost of his [name] once this is met.   Follow up with [facility] today to confirm cost - patient now has $0 copayment for his [name]  Attempt to reach patient by phone to provide this update. Leave a message requesting a call back.   Follow Up Plan: Clinical Pharmacist will outreach to patient by telephone within the next 7 days   Wallace Cullens, PharmD, [facility] [place] [phone]

## 2022-05-05 LAB — CBC
HCT: 51.4 % — ABNORMAL HIGH (ref 38.5–50.0)
Hemoglobin: 17.1 g/dL (ref 13.2–17.1)
MCH: 27.9 pg (ref 27.0–33.0)
MCHC: 33.3 g/dL (ref 32.0–36.0)
MCV: 83.8 fL (ref 80.0–100.0)
MPV: 11.5 fL (ref 7.5–12.5)
Platelets: 164 10*3/uL (ref 140–400)
RBC: 6.13 10*6/uL — ABNORMAL HIGH (ref 4.20–5.80)
RDW: 14.9 % (ref 11.0–15.0)
WBC: 10.6 10*3/uL (ref 3.8–10.8)

## 2022-05-05 LAB — LIPID PANEL
Cholesterol: 152 mg/dL (ref <200)
HDL: 52 mg/dL (ref >=40)
LDL Cholesterol (Calc): 84 mg/dL (calc)
Non-HDL Cholesterol (Calc): 100 mg/dL (calc) (ref <130)
Total CHOL/HDL Ratio: 2.9 (calc) (ref <5.0)
Triglycerides: 74 mg/dL (ref <150)

## 2022-05-05 LAB — MICROALBUMIN / CREATININE URINE RATIO
Creatinine, Urine: 236 mg/dL (ref 20–320)
Microalb Creat Ratio: 149 mcg/mg creat — ABNORMAL HIGH (ref <30)
Microalb, Ur: 35.1 mg/dL

## 2022-05-05 LAB — COMPLETE METABOLIC PANEL WITH GFR
AG Ratio: 1.6 (calc) (ref 1.0–2.5)
ALT: 8 U/L — ABNORMAL LOW (ref 9–46)
AST: 12 U/L (ref 10–35)
Albumin: 4.1 g/dL (ref 3.6–5.1)
Alkaline phosphatase (APISO): 107 U/L (ref 35–144)
BUN: 17 mg/dL (ref 7–25)
CO2: 26 mmol/L (ref 20–32)
Calcium: 9.6 mg/dL (ref 8.6–10.3)
Chloride: 104 mmol/L (ref 98–110)
Creat: 1.03 mg/dL (ref 0.70–1.35)
Globulin: 2.6 g/dL (calc) (ref 1.9–3.7)
Glucose, Bld: 165 mg/dL — ABNORMAL HIGH (ref 65–99)
Potassium: 4.6 mmol/L (ref 3.5–5.3)
Sodium: 143 mmol/L (ref 135–146)
Total Bilirubin: 0.3 mg/dL (ref 0.2–1.2)
Total Protein: 6.7 g/dL (ref 6.1–8.1)
eGFR: 81 mL/min/{1.73_m2} (ref >=60)

## 2022-05-05 LAB — HEMOGLOBIN A1C
Hgb A1c MFr Bld: 6.2 % of total Hgb — ABNORMAL HIGH (ref <5.7)
Mean Plasma Glucose: 131 mg/dL
eAG (mmol/L): 7.3 mmol/L

## 2022-05-05 LAB — PSA: PSA: 4.78 ng/mL — ABNORMAL HIGH (ref <=4.00)

## 2022-05-07 ENCOUNTER — Ambulatory Visit: Payer: BC Managed Care – PPO | Admitting: Pharmacist

## 2022-05-07 ENCOUNTER — Telehealth: Payer: Self-pay | Admitting: Pharmacist

## 2022-05-07 DIAGNOSIS — I10 Essential (primary) hypertension: Secondary | ICD-10-CM

## 2022-05-07 DIAGNOSIS — E1165 Type 2 diabetes mellitus with hyperglycemia: Secondary | ICD-10-CM

## 2022-05-07 NOTE — Patient Instructions (Signed)
Goals Addressed             This Visit's Progress    Pharmacy Goals       Our goal A1c is less than 7%. This corresponds with fasting sugars less than 130 and 2 hour after meal sugars less than 180. Please check your fasting blood sugar and keep a log of the results  Check your blood pressure twice weekly, and any time you have concerning symptoms like headache, chest pain, dizziness, shortness of breath, or vision changes.    To appropriately check your blood pressure, make sure you do the following:  1) Avoid caffeine, exercise, or tobacco products for 30 minutes before checking. Empty your bladder. 2) Sit with your back supported in a flat-backed chair. Rest your arm on something flat (arm of the chair, table, etc). 3) Sit still with your feet flat on the floor, resting, for at least 5 minutes.  4) Check your blood pressure. Take 1-2 readings.  5) Write down these readings and bring with you to any provider appointments.  Bring your home blood pressure machine with you to a provider's office for accuracy comparison at least once a year.   Make sure you take your blood pressure medications before you come to any office visit, even if you were asked to fast for labs.   Feel free to call me with any questions or concerns. I look forward to our next call!  Wallace Cullens, PharmD, [place] [phone]

## 2022-05-07 NOTE — Telephone Encounter (Signed)
Patient requesting renewal of his [name] Rx as current Rx out of refills.  Would you please send renewal to [facility] for patient?  Thank you!  Anthony Bell

## 2022-05-07 NOTE — Progress Notes (Signed)
05/07/2022 Name: Anthony Bell MRN: [id number] DOB: Aug 09, 1955  Chief Complaint  Patient presents with   Medication Management   Medication Assistance    Anthony Bell is a 67 y.o. year old male who presented for a telephone visit.   They were referred to the pharmacist by their PCP for assistance in managing diabetes, hypertension, and medication access.   Receive a message from PCP on 2/9 requesting assistance to patient with the cost of his insulin and advising patient has only ~1 week supply of his Tresiba remaining   Subjective:  Care Team: Primary Care Provider: [name], NP ; Next Scheduled Visit: 08/02/2022 Oncology: [facility]; Next Scheduled Visit: 05/17/2022   Medication Access/Adherence  [id number] [address] [phone]: [address]Phone: [phone]: [phone]  Patient reports affordability concerns with their medications: Yes  Patient reports access/transportation concerns to their pharmacy: No  Patient reports adherence concerns with their medications:  No    Follow up with patient today as requested by PCP regarding assistance with the cost of his Tresiba insulin. - Note earlier in calendar year counseled patient on his annual prescription/medical deductible, how his medical visits impact this and on the expected cost of his TTyler Aasonce this is met  - On 2/9, [facility] and was advised patient now has $0 copayment for his TTyler Aas  Reach patient today to let him know. Patient states that he will pick this up from the pharmacy later today   T2DM: Uncontrolled; current treatment: Metformin 1000 mg twice daily with meals Jardiance 10 mg daily before breakfast Tresiba 20 units daily  Recalls blood  sugar has been "good" recently since his TAntigua and Barbudadose was reduced back to 20 units daily, but does not recall/have specific results to review today. Reports blood sugar monitored closely with Oncology team as well Patient denies hypoglycemic s/sx including dizziness, shakiness, sweating   Reports in need of renewal of his Jardiance Rx as current prescription out of refills   Current medication access support: Uses copayment card from manufacturers for Jardiance and Tresiba      Hypertension: Current treatment: amlodipine 5 mg - 2 tablets (10 mg) daily (increased by PCP on 2/9) lisinopril 5 mg daily    Reports increased amlodipine dose as above as directed by PCP on 2/9. Denies having had his blood pressure monitored since then, but plans to have the nurse at work help him to continue to monitor and log the reading     Objective:  Lab Results  Component Value Date   HGBA1C 6.2 (H) 05/04/2022    Lab Results  Component Value Date   CREATININE 1.03 05/04/2022   BUN 17 05/04/2022   NA 143 05/04/2022   K 4.6 05/04/2022   CL 104 05/04/2022   CO2 26 05/04/2022    Lab Results  Component Value Date   CHOL 152 05/04/2022   HDL 52 05/04/2022   LDLCALC 84 05/04/2022   TRIG 74 05/04/2022   CHOLHDL 2.9 05/04/2022   BP Readings from Last 3 Encounters:  05/04/22 (!) 148/68  01/18/22 134/72  05/08/21 121/78   Pulse Readings from Last 3 Encounters:  05/04/22 72  01/18/22 70  05/08/21 66     Medications Reviewed Today  Reviewed by [name], NP (Nurse Practitioner) on 05/04/22 at 1400  Med List Status: <None>   Medication Order Taking? Sig Documenting Provider Last Dose Status Informant  amLODipine (NORVASC) 5 MG tablet [id number]  Take 2 tablets (10 mg total) by mouth daily. [name], NP  Active   aspirin EC 81 MG tablet [id number] Yes Take 81 mg by mouth daily. Swallow whole. [provider] Taking Active   atorvastatin (LIPITOR) 20 MG tablet  [id number] Yes Take 1 tablet (20 mg total) by mouth daily. [name], NP Taking Active   blood glucose meter kit and supplies KIT [id number] Yes Dispense based on patient and insurance preference. Use up to four times daily as directed. 100 strips and lancets please [name], NP Taking Active   Blood Glucose Monitoring Suppl (CONTOUR NEXT EZ) w/Device KIT [id number] Yes USE UP TO FOUR TIMES DAILY AS DIRECTED. (FOR ICD-10 E10.9, E11.9). [name], DO Taking Active   CONTOUR NEXT TEST test strip [id number] Yes USE AS DIRECTED UPTO 4 TIMES A [name], NP Taking Active   dexamethasone (DECADRON) 4 MG tablet [id number] Yes Take 76m (2 x 464mtablets) by mouth in the morning for 2 days after oxaliplatin chemotherapy, then as directed. [provider] Taking Active   empagliflozin (JARDIANCE) 10 MG TABS tablet [id number] Take 1 tablet (10 mg total) by mouth daily before breakfast. BaJearld FentonNP Taking Active   insulin degludec (TRESIBA) 100 UNIT/ML FlexTouch Pen [id number] Inject 20 Units into the skin daily. BaJearld FentonNP Taking Active   Insulin Pen Needle 31G X 5 MM MISC [id number] BD Pen Needles- brand specific Inject insulin via insulin pen 6 x daily BaJearld FentonNP Taking Active   lisinopril (ZESTRIL) 5 MG tablet [id number] 1 tablet (5 mg total) by mouth daily. BaJearld FentonNP  Active   magnesium oxide (MAG-OX) 400 MG tablet [id number] 2 tablets (800 mg total) by mouth once daily BaJearld FentonNP  Active   metFORMIN (GLUCOPHAGE) 1000 MG tablet [id number] Take 1 tablet (1,000 mg total) by mouth 2 (two) times daily with a meal. TAKE 1 TABLET (1,000 MG TOTAL) BY MOUTH TWICE A DAY WITH A MEAL Strength: 1,000 mg Baity, Regina W, NP Taking Active   ondansetron (ZOFRAN-ODT) 4 MG disintegrating tablet [id number] 1 tablet (4 mg total) by mouth every 8 (eight) hours as needed for nausea or vomiting. BaJearld FentonNP  Active    potassium chloride SA (KLOR-CON M) 20 MEQ tablet [id number] Take 1 tablet (20 mEq total) by mouth once daily [provider] Taking Active               Assessment/Plan:   Diabetes: - Patient to pick up TrAntigua and Barbudax refill from pharmacy - Have counseled on monitoring for symptoms of hypoglycemia and how to manage lows Note patient carries soft peppermint candies with him to use as rapid source of sugar if needed for lows - Send message to clinical team to request renewal of patient's Jardiance [facility] - Encouraged patient to monitor, keep log of results, bring this record with him to his medical appointments and to contact provider if needed for readings outside established parameters or any symptoms of hypoglycemia   Hypertension: - Patient to have blood pressure monitored as able at home/work, keep log of results, bring record to upcoming medical  appointments and contact office sooner for readings outside of establish parameters of symptoms       Follow Up Plan: Clinical Pharmacist will outreach to patient by telephone on 06/11/2022 at 3:15 pm

## 2022-05-08 MED ORDER — EMPAGLIFLOZIN 10 MG PO TABS: 10.0000 mg | ORAL_TABLET | Freq: Every day | ORAL | 1 refills | Status: DC

## 2022-05-08 NOTE — Telephone Encounter (Signed)
empagliflozin (JARDIANCE) 10 MG TABS tablet90 tablet12/13/2024Sig - Route: Take 1 tablet (10 mg total) by mouth daily before breakfast. - OralSent to pharmacy as: empagliflozin (JARDIANCE) 10 MG Tab tabletE-Prescribing Status: Receipt confirmed by pharmacy (05/08/2022  9:23 AM EST)

## 2022-05-17 ENCOUNTER — Other Ambulatory Visit: Payer: Self-pay | Admitting: Internal Medicine

## 2022-05-17 DIAGNOSIS — F17210 Nicotine dependence, cigarettes, uncomplicated: Secondary | ICD-10-CM | POA: Diagnosis not present

## 2022-05-17 DIAGNOSIS — C20 Malignant neoplasm of rectum: Secondary | ICD-10-CM | POA: Diagnosis not present

## 2022-05-17 DIAGNOSIS — Z5112 Encounter for antineoplastic immunotherapy: Secondary | ICD-10-CM | POA: Diagnosis not present

## 2022-05-17 DIAGNOSIS — R110 Nausea: Secondary | ICD-10-CM | POA: Diagnosis not present

## 2022-05-17 DIAGNOSIS — Z79899 Other long term (current) drug therapy: Secondary | ICD-10-CM | POA: Diagnosis not present

## 2022-05-17 DIAGNOSIS — Z5111 Encounter for antineoplastic chemotherapy: Secondary | ICD-10-CM | POA: Diagnosis not present

## 2022-05-17 DIAGNOSIS — T451X5A Adverse effect of antineoplastic and immunosuppressive drugs, initial encounter: Secondary | ICD-10-CM | POA: Diagnosis not present

## 2022-05-17 DIAGNOSIS — C787 Secondary malignant neoplasm of liver and intrahepatic bile duct: Secondary | ICD-10-CM | POA: Diagnosis not present

## 2022-05-17 DIAGNOSIS — E1165 Type 2 diabetes mellitus with hyperglycemia: Secondary | ICD-10-CM

## 2022-05-17 NOTE — Telephone Encounter (Signed)
Requested Prescriptions  Pending Prescriptions Disp Refills   metFORMIN (GLUCOPHAGE) 1000 MG tablet [Pharmacy Med Name: METFORMIN HCL 1,000 MG TABLET] 180 tablet 0    Sig: TAKE 1 TABLET (1,000 MG TOTAL) BY MOUTH TWICE A DAY WITH A MEAL     Endocrinology:  Diabetes - Biguanides Failed - 05/17/2022  2:15 AM      Failed - B12 Level in normal range and within 720 days    No results found for: "VITAMINB12"       Failed - CBC within normal limits and completed in the last 12 months    WBC  Date Value Ref Range Status  05/04/2022 10.6 3.8 - 10.8 Thousand/uL Final   RBC  Date Value Ref Range Status  05/04/2022 6.13 (H) 4.20 - 5.80 Million/uL Final   Hemoglobin  Date Value Ref Range Status  05/04/2022 17.1 13.2 - 17.1 g/dL Final   HGB  Date Value Ref Range Status  10/10/2011 14.9 13.0 - 18.0 g/dL Final   HCT  Date Value Ref Range Status  05/04/2022 51.4 (H) 38.5 - 50.0 % Final  10/10/2011 46.9 40.0 - 52.0 % Final   MCHC  Date Value Ref Range Status  05/04/2022 33.3 32.0 - 36.0 g/dL Final   [facility]  Date Value Ref Range Status  05/04/2022 27.9 27.0 - 33.0 pg Final   MCV  Date Value Ref Range Status  05/04/2022 83.8 80.0 - 100.0 fL Final  10/10/2011 87 80 - 100 fL Final   No results found for: "PLTCOUNTKUC", "LABPLAT", "POCPLA" RDW  Date Value Ref Range Status  05/04/2022 14.9 11.0 - 15.0 % Final  10/10/2011 14.3 11.5 - 14.5 % Final         Passed - Cr in normal range and within 360 days    Creat  Date Value Ref Range Status  05/04/2022 1.03 0.70 - 1.35 mg/dL Final   Creatinine, Urine  Date Value Ref Range Status  05/04/2022 236 20 - 320 mg/dL Final         Passed - HBA1C is between 0 and 7.9 and within 180 days    HbA1c POC (<> result, manual entry)  Date Value Ref Range Status  01/18/2022 9.6 4.0 - 5.6 % Final   Hgb A1c MFr Bld  Date Value Ref Range Status  05/04/2022 6.2 (H) <5.7 % of total Hgb Final    Comment:    For someone without known diabetes, a  hemoglobin  A1c value between 5.7% and 6.4% is consistent with prediabetes and should be confirmed with a  follow-up test. . For someone with known diabetes, a value <7% indicates that their diabetes is well controlled. A1c targets should be individualized based on duration of diabetes, age, comorbid conditions, and other considerations. . This assay result is consistent with an increased risk of diabetes. . Currently, no consensus exists regarding use of hemoglobin A1c for diagnosis of diabetes for children. .          Passed - eGFR in normal range and within 360 days    GFR, Est African American  Date Value Ref Range Status  09/20/2020 70 > OR = 60 mL/min/1.81m Final   GFR, Est Non African American  Date Value Ref Range Status  09/20/2020 60 > OR = 60 mL/min/1.736mFinal   GFR, Estimated  Date Value Ref Range Status  05/08/2021 50 (L) >60 mL/min Final    Comment:    (NOTE) Calculated using the CKD-EPI Creatinine Equation (2021)  eGFR  Date Value Ref Range Status  05/04/2022 81 > OR = 60 mL/min/1.81m Final         Passed - Valid encounter within last 6 months    Recent Outpatient Visits           1 week ago Type 2 diabetes mellitus with hyperglycemia, without long-term current use of insulin ([facility]   [place]land OrmondA, RPH-CPP   1 week ago Type 2 diabetes mellitus with hyperglycemia, without long-term current use of insulin ([facility]   [place] RPH-CPP   1 week ago Encounter for general adult medical examination with abnormal findings   [facility]   2 weeks ago No-show for appointment   [facility]   1 month ago Type 2 diabetes mellitus with hyperglycemia, with long-term current use of insulin ([facility]   [place] RPH-CPP        Future Appointments             In 2 months Baity, RCoralie Keens NP [facility]

## 2022-05-19 ENCOUNTER — Other Ambulatory Visit: Payer: Self-pay | Admitting: Internal Medicine

## 2022-05-19 DIAGNOSIS — E785 Hyperlipidemia, unspecified: Secondary | ICD-10-CM

## 2022-05-19 DIAGNOSIS — I10 Essential (primary) hypertension: Secondary | ICD-10-CM

## 2022-05-21 NOTE — Telephone Encounter (Signed)
Requested Prescriptions  Pending Prescriptions Disp Refills   atorvastatin (LIPITOR) 20 MG tablet [Pharmacy Med Name: ATORVASTATIN 20 MG TABLET] 90 tablet 0    Sig: TAKE 1 TABLET BY MOUTH EVERY DAY     Cardiovascular:  Antilipid - Statins Failed - 05/19/2022 12:07 PM      Failed - Lipid Panel in normal range within the last 12 months    Cholesterol  Date Value Ref Range Status  05/04/2022 152 <200 mg/dL Final   LDL Cholesterol (Calc)  Date Value Ref Range Status  05/04/2022 84 mg/dL (calc) Final    Comment:    Reference range: <100 . Desirable range <100 mg/dL for primary prevention;   <70 mg/dL for patients with CHD or diabetic patients  with > or = 2 CHD risk factors. Marland Kitchen LDL-C is now calculated using the Martin-Hopkins  calculation, which is a validated novel method providing  better accuracy than the Friedewald equation in the  estimation of LDL-C.  Cresenciano Genre et al. Annamaria Helling. [id number]): 2061-2068  (http://education.[employer].com/faq/FAQ164)    HDL  Date Value Ref Range Status  05/04/2022 52 > OR = 40 mg/dL Final   Triglycerides  Date Value Ref Range Status  05/04/2022 74 <150 mg/dL Final         Passed - Patient is not pregnant      Passed - Valid encounter within last 12 months    Recent Outpatient Visits           2 weeks ago Type 2 diabetes mellitus with hyperglycemia, without long-term current use of insulin ([place])   [facility] [place], Grayland Ormond A, RPH-CPP   2 weeks ago Type 2 diabetes mellitus with hyperglycemia, without long-term current use of insulin ([place])   [facility] [place], Grayland Ormond A, RPH-CPP   2 weeks ago Encounter for general adult medical examination with abnormal findings   [facility] [place], [name], NP   2 weeks ago No-show for appointment   [facility] [place], [name], NP   1 month ago Type 2 diabetes mellitus with  hyperglycemia, with long-term current use of insulin ([place])   [place], Virl Diamond, RPH-CPP       Future Appointments             In 2 months Baity, [name], NP [facility]             lisinopril (ZESTRIL) 20 MG tablet [Pharmacy Med Name: LISINOPRIL 20 MG TABLET] 90 tablet 1    Sig: TAKE 1 TABLET BY MOUTH EVERY DAY     Cardiovascular:  ACE Inhibitors Failed - 05/19/2022 12:07 PM      Failed - Last BP in normal range    BP Readings from Last 1 Encounters:  05/04/22 (!) 148/68         Passed - Cr in normal range and within 180 days    Creat  Date Value Ref Range Status  05/04/2022 1.03 0.70 - 1.35 mg/dL Final   Creatinine, Urine  Date Value Ref Range Status  05/04/2022 236 20 - 320 mg/dL Final         Passed - K in normal range and within 180 days    Potassium  Date Value Ref Range Status  05/04/2022 4.6 3.5 - 5.3 mmol/L Final  10/10/2011 4.3 3.5 - 5.1 mmol/L Final  Passed - Patient is not pregnant      Passed - Valid encounter within last 6 months    Recent Outpatient Visits           2 weeks ago Type 2 diabetes mellitus with hyperglycemia, without long-term current use of insulin ([place])   [place], Grayland Ormond A, RPH-CPP   2 weeks ago Type 2 diabetes mellitus with hyperglycemia, without long-term current use of insulin [facility])   [facility] [place], Virl Diamond, RPH-CPP   2 weeks ago Encounter for general adult medical examination with abnormal findings   [facility] [place], [name], NP   2 weeks ago No-show for appointment   [facility] [place], [name], NP   1 month ago Type 2 diabetes mellitus with hyperglycemia, with long-term current use of insulin [facility])   [place], RPH-CPP       Future Appointments              In 2 months Baity, [name], NP [facility]             amLODipine (NORVASC) 5 MG tablet [Pharmacy Med Name: AMLODIPINE BESYLATE 5 MG TAB] 90 tablet 0    Sig: TAKE 1 TABLET (5 MG TOTAL) BY MOUTH DAILY.     Cardiovascular: Calcium Channel Blockers 2 Failed - 05/19/2022 12:07 PM      Failed - Last BP in normal range    BP Readings from Last 1 Encounters:  05/04/22 (!) 148/68         Passed - Last Heart Rate in normal range    Pulse Readings from Last 1 Encounters:  05/04/22 72         Passed - Valid encounter within last 6 months    Recent Outpatient Visits           2 weeks ago Type 2 diabetes mellitus with hyperglycemia, without long-term current use of insulin ([place])   [place], Grayland Ormond A, RPH-CPP   2 weeks ago Type 2 diabetes mellitus with hyperglycemia, without long-term current use of insulin [facility])   [place], Virl Diamond, RPH-CPP   2 weeks ago Encounter for general adult medical examination with abnormal findings   [facility] [place], [name], NP   2 weeks ago No-show for appointment   [facility] [place], [name], NP   1 month ago Type 2 diabetes mellitus with hyperglycemia, with long-term current use of insulin [facility])   [place], Virl Diamond, RPH-CPP       Future Appointments             In 2 months Baity, [name], NP [facility], [facility]

## 2022-05-31 DIAGNOSIS — F17210 Nicotine dependence, cigarettes, uncomplicated: Secondary | ICD-10-CM | POA: Diagnosis not present

## 2022-05-31 DIAGNOSIS — C7802 Secondary malignant neoplasm of left lung: Secondary | ICD-10-CM | POA: Diagnosis not present

## 2022-05-31 DIAGNOSIS — Z79899 Other long term (current) drug therapy: Secondary | ICD-10-CM | POA: Diagnosis not present

## 2022-05-31 DIAGNOSIS — C20 Malignant neoplasm of rectum: Secondary | ICD-10-CM | POA: Diagnosis not present

## 2022-05-31 DIAGNOSIS — Z5111 Encounter for antineoplastic chemotherapy: Secondary | ICD-10-CM | POA: Diagnosis not present

## 2022-05-31 DIAGNOSIS — Z5112 Encounter for antineoplastic immunotherapy: Secondary | ICD-10-CM | POA: Diagnosis not present

## 2022-05-31 DIAGNOSIS — C787 Secondary malignant neoplasm of liver and intrahepatic bile duct: Secondary | ICD-10-CM | POA: Diagnosis not present

## 2022-05-31 DIAGNOSIS — C786 Secondary malignant neoplasm of retroperitoneum and peritoneum: Secondary | ICD-10-CM | POA: Diagnosis not present

## 2022-06-11 ENCOUNTER — Telehealth: Payer: BC Managed Care – PPO

## 2022-06-11 ENCOUNTER — Telehealth: Payer: Self-pay | Admitting: Pharmacist

## 2022-06-11 NOTE — Telephone Encounter (Signed)
   Outreach Note  06/11/2022 Name: Anthony Bell MRN: [id number] DOB: [birth year]  Referred by: [name], NP Reason for referral : No chief complaint on file.   Was unable to reach patient via telephone today and have left HIPAA compliant voicemail asking patient to return my call.    Follow Up Plan: Will outreach to patient by telephone again within the next 30 days  Wallace Cullens, PharmD, [facility] [place] [phone]

## 2022-06-14 DIAGNOSIS — C7802 Secondary malignant neoplasm of left lung: Secondary | ICD-10-CM | POA: Diagnosis not present

## 2022-06-14 DIAGNOSIS — Z5112 Encounter for antineoplastic immunotherapy: Secondary | ICD-10-CM | POA: Diagnosis not present

## 2022-06-14 DIAGNOSIS — I10 Essential (primary) hypertension: Secondary | ICD-10-CM | POA: Diagnosis not present

## 2022-06-14 DIAGNOSIS — C787 Secondary malignant neoplasm of liver and intrahepatic bile duct: Secondary | ICD-10-CM | POA: Diagnosis not present

## 2022-06-14 DIAGNOSIS — C20 Malignant neoplasm of rectum: Secondary | ICD-10-CM | POA: Diagnosis not present

## 2022-06-14 DIAGNOSIS — Z5111 Encounter for antineoplastic chemotherapy: Secondary | ICD-10-CM | POA: Diagnosis not present

## 2022-06-14 DIAGNOSIS — F17210 Nicotine dependence, cigarettes, uncomplicated: Secondary | ICD-10-CM | POA: Diagnosis not present

## 2022-06-14 DIAGNOSIS — T451X5A Adverse effect of antineoplastic and immunosuppressive drugs, initial encounter: Secondary | ICD-10-CM | POA: Diagnosis not present

## 2022-06-14 DIAGNOSIS — C7801 Secondary malignant neoplasm of right lung: Secondary | ICD-10-CM | POA: Diagnosis not present

## 2022-06-14 DIAGNOSIS — C19 Malignant neoplasm of rectosigmoid junction: Secondary | ICD-10-CM | POA: Diagnosis not present

## 2022-06-14 DIAGNOSIS — R110 Nausea: Secondary | ICD-10-CM | POA: Diagnosis not present

## 2022-06-14 DIAGNOSIS — Z79899 Other long term (current) drug therapy: Secondary | ICD-10-CM | POA: Diagnosis not present

## 2022-06-28 DIAGNOSIS — Z5112 Encounter for antineoplastic immunotherapy: Secondary | ICD-10-CM | POA: Diagnosis not present

## 2022-06-28 DIAGNOSIS — C20 Malignant neoplasm of rectum: Secondary | ICD-10-CM | POA: Diagnosis not present

## 2022-06-28 DIAGNOSIS — Z5111 Encounter for antineoplastic chemotherapy: Secondary | ICD-10-CM | POA: Diagnosis not present

## 2022-07-12 DIAGNOSIS — F17210 Nicotine dependence, cigarettes, uncomplicated: Secondary | ICD-10-CM | POA: Diagnosis not present

## 2022-07-12 DIAGNOSIS — Z5111 Encounter for antineoplastic chemotherapy: Secondary | ICD-10-CM | POA: Diagnosis not present

## 2022-07-12 DIAGNOSIS — Z79899 Other long term (current) drug therapy: Secondary | ICD-10-CM | POA: Diagnosis not present

## 2022-07-12 DIAGNOSIS — C20 Malignant neoplasm of rectum: Secondary | ICD-10-CM | POA: Diagnosis not present

## 2022-07-12 DIAGNOSIS — Z5112 Encounter for antineoplastic immunotherapy: Secondary | ICD-10-CM | POA: Diagnosis not present

## 2022-07-12 DIAGNOSIS — I10 Essential (primary) hypertension: Secondary | ICD-10-CM | POA: Diagnosis not present

## 2022-07-16 DIAGNOSIS — C20 Malignant neoplasm of rectum: Secondary | ICD-10-CM | POA: Diagnosis not present

## 2022-07-23 ENCOUNTER — Telehealth: Payer: Self-pay | Admitting: Pharmacist

## 2022-07-23 NOTE — Telephone Encounter (Signed)
   Outreach Note  07/23/2022 Name: Anthony Bell MRN: [id number] DOB: [birth year]  Referred by: Lorre Munroe, NP Reason for referral : No chief complaint on file.   Was unable to reach patient via telephone today and have left HIPAA compliant voicemail asking patient to return my call. Outreach attempt #2.   Follow Up Plan: Will outreach to patient by telephone again within the next 30 days  Estelle Grumbles, PharmD, [facility] Clinical Pharmacist [facility] [phone]

## 2022-07-25 ENCOUNTER — Telehealth: Payer: BC Managed Care – PPO

## 2022-08-02 ENCOUNTER — Ambulatory Visit: Payer: BC Managed Care – PPO | Admitting: Internal Medicine

## 2022-08-02 NOTE — Progress Notes (Deleted)
Subjective:    Patient ID: Anthony Bell, male    DOB: 1956-01-26, 67 y.o.   MRN: [id number]  HPI  Patient presents to clinic today for 57-month follow-up of chronic conditions.  HTN: His BP today is.  He is taking Amlodipine and Losartan as prescribed.  ECG from 04/2021 reviewed.  HLD with Aortic Atherosclerosis: His last LDL was 87, triglycerides 74, 04/2022.  He denies myalgias on Atorvastatin.  He is taking Aspirin daily.  He does not consume low-fat diet.  DM2: His last A1c was 6.2%, 04/2022.  He is taking Metformin, Jardiance and [place] as prescribed.  His sugars range.  He does not check his feet routinely.  His last eye exam was >2 years ago.  Flu 12/2021.  Pneumovax 07/2017.  COVID Pfizer x 2.  GERD: Triggered by spicy foods.  He takes Tums as needed with good relief of symptoms.  Upper GI from 08/2014 reviewed.  COPD: He denies chronic cough or shortness of breath.  He uses Albuterol only as needed with good relief of symptoms.  There are no PFTs on file.  He does continue to smoke.  History of Rectal Cancer: Status post surgery, currently on chemo.  He follows with oncology.  ED: He is not currently taking any medications for this.  He does not follow with urology.  Review of Systems     Past Medical History:  Diagnosis Date   Colon polyps    Diabetes mellitus without complication (HCC)    GERD (gastroesophageal reflux disease)    Hypertension    Rectal cancer (HCC)    [facility]    Current Outpatient Medications  Medication Sig Dispense Refill   amLODipine (NORVASC) 5 MG tablet TAKE 1 TABLET (5 MG TOTAL) BY MOUTH DAILY. 90 tablet 0   aspirin EC 81 MG tablet Take 81 mg by mouth daily. Swallow whole.     atorvastatin (LIPITOR) 20 MG tablet TAKE 1 TABLET BY MOUTH EVERY DAY 90 tablet 0   blood glucose meter kit and supplies KIT Dispense based on patient and insurance preference. Use up to four times daily as directed. 100 strips and lancets please 1 each 0   Blood Glucose  Monitoring Suppl (CONTOUR NEXT EZ) w/Device KIT USE UP TO FOUR TIMES DAILY AS DIRECTED. (FOR ICD-10 E10.9, E11.9). 1 kit 0   CONTOUR NEXT TEST test strip USE AS DIRECTED UPTO 4 TIMES A DAY 100 strip 5   dexamethasone (DECADRON) 4 MG tablet Take 8mg  (2 x 4mg  tablets) by mouth in the morning for 2 days after oxaliplatin chemotherapy, then as directed.     empagliflozin (JARDIANCE) 10 MG TABS tablet Take 1 tablet (10 mg total) by mouth daily before breakfast. 90 tablet 1   insulin degludec (TRESIBA) 100 UNIT/ML FlexTouch Pen Inject 20 Units into the skin daily. 15 mL 1   Insulin Pen Needle 31G X 5 MM MISC BD Pen Needles- brand specific Inject insulin via insulin pen 6 x daily 200 each 3   lisinopril (ZESTRIL) 5 MG tablet Take 1 tablet (5 mg total) by mouth daily. 90 tablet 1   magnesium oxide (MAG-OX) 400 MG tablet Take 2 tablets (800 mg total) by mouth once daily 90 tablet 1   metFORMIN (GLUCOPHAGE) 1000 MG tablet TAKE 1 TABLET (1,000 MG TOTAL) BY MOUTH TWICE A DAY WITH A MEAL 180 tablet 0   ondansetron (ZOFRAN-ODT) 4 MG disintegrating tablet Take 1 tablet (4 mg total) by mouth every 8 (eight) hours as needed for  nausea or vomiting. 20 tablet 0   potassium chloride SA (KLOR-CON M) 20 MEQ tablet Take 1 tablet (20 mEq total) by mouth once daily     No current facility-administered medications for this visit.    No Known Allergies  Family History  Problem Relation Age of Onset   Diabetes Mother    Cancer Father    Diabetes Father    Diabetes Sister    Heart attack Neg Hx    Stroke Neg Hx     Social History   Socioeconomic History   Marital status: Married    Spouse name: Not on file   Number of children: Not on file   Years of education: Not on file   Highest education level: Not on file  Occupational History   Not on file  Tobacco Use   Smoking status: Every Day    Packs/day: 1.00    Years: 40.00    Additional pack years: 0.00    Total pack years: 40.00    Types: Cigarettes    Smokeless tobacco: Never  Vaping Use   Vaping Use: Never used  Substance and Sexual Activity   Alcohol use: No   Drug use: No   Sexual activity: Not on file  Other Topics Concern   Not on file  Social History Narrative   Not on file   Social Determinants of Health   Financial Resource Strain: Low Risk  (01/12/2021)   Overall Financial Resource Strain (CARDIA)    Difficulty of Paying Living Expenses: Not hard at all  Food Insecurity: No Food Insecurity (01/12/2021)   Hunger Vital Sign    Worried About Running Out of Food in the Last Year: Never true    Ran Out of Food in the Last Year: Never true  Transportation Needs: No Transportation Needs (01/12/2021)   PRAPARE - Administrator, Civil Service (Medical): No    Lack of Transportation (Non-Medical): No  Physical Activity: Inactive (01/12/2021)   Exercise Vital Sign    Days of Exercise per Week: 0 days    Minutes of Exercise per Session: 0 min  Stress: No Stress Concern Present (01/12/2021)   [employer] of Occupational Health - Occupational Stress Questionnaire    Feeling of Stress : Not at all  Social Connections: Moderately Isolated (01/12/2021)   Social Connection and Isolation Panel [NHANES]    Frequency of Communication with Friends and Family: More than three times a week    Frequency of Social Gatherings with Friends and Family: More than three times a week    Attends Religious Services: Never    Database administrator or Organizations: No    Attends Banker Meetings: Never    Marital Status: Married  Catering manager Violence: Not At Risk (01/12/2021)   Humiliation, Afraid, Rape, and Kick questionnaire    Fear of Current or Ex-Partner: No    Emotionally Abused: No    Physically Abused: No    Sexually Abused: No     Constitutional: Denies fever, malaise, fatigue, headache or abrupt weight changes.  HEENT: Denies eye pain, eye redness, ear pain, ringing in the ears, wax buildup,  runny nose, nasal congestion, bloody nose, or sore throat. Respiratory: Denies difficulty breathing, shortness of breath, cough or sputum production.   Cardiovascular: Denies chest pain, chest tightness, palpitations or swelling in the hands or feet.  Gastrointestinal: Denies abdominal pain, bloating, constipation, diarrhea or blood in the stool.  GU: Patient reports erectile dysfunction.  Denies urgency, frequency, pain with urination, burning sensation, blood in urine, odor or discharge. Musculoskeletal: Denies decrease in range of motion, difficulty with gait, muscle pain or joint pain and swelling.  Skin: Denies redness, rashes, lesions or ulcercations.  Neurological: Denies dizziness, difficulty with memory, difficulty with speech or problems with balance and coordination.  Psych: Denies anxiety, depression, SI/HI.  No other specific complaints in a complete review of systems (except as listed in HPI above).  Objective:   Physical Exam  There were no vitals taken for this visit. Wt Readings from Last 3 Encounters:  05/04/22 176 lb (79.8 kg)  01/18/22 171 lb (77.6 kg)  05/08/21 197 lb (89.4 kg)    General: Appears their stated age, well developed, well nourished in NAD. Skin: Warm, dry and intact. No rashes, lesions or ulcerations noted. HEENT: Head: normal shape and size; Eyes: sclera white, no icterus, conjunctiva pink, PERRLA and EOMs intact; Ears: Tm's gray and intact, normal light reflex; Nose: mucosa pink and moist, septum midline; Throat/Mouth: Teeth present, mucosa pink and moist, no exudate, lesions or ulcerations noted.  Neck:  Neck supple, trachea midline. No masses, lumps or thyromegaly present.  Cardiovascular: Normal rate and rhythm. S1,S2 noted.  No murmur, rubs or gallops noted. No JVD or BLE edema. No carotid bruits noted. Pulmonary/Chest: Normal effort and positive vesicular breath sounds. No respiratory distress. No wheezes, rales or ronchi noted.  Abdomen: Soft and  nontender. Normal bowel sounds. No distention or masses noted. Liver, spleen and kidneys non palpable. Musculoskeletal: Normal range of motion. No signs of joint swelling. No difficulty with gait.  Neurological: Alert and oriented. Cranial nerves II-XII grossly intact. Coordination normal.  Psychiatric: Mood and affect normal. Behavior is normal. Judgment and thought content normal.    BMET    Component Value Date/Time   NA 143 05/04/2022 1401   NA 144 10/10/2011 1615   K 4.6 05/04/2022 1401   K 4.3 10/10/2011 1615   CL 104 05/04/2022 1401   CL 104 10/10/2011 1615   CO2 26 05/04/2022 1401   CO2 35 (H) 10/10/2011 1615   GLUCOSE 165 (H) 05/04/2022 1401   GLUCOSE 106 (H) 10/10/2011 1615   BUN 17 05/04/2022 1401   BUN 17 10/10/2011 1615   CREATININE 1.03 05/04/2022 1401   CALCIUM 9.6 05/04/2022 1401   CALCIUM 9.2 10/10/2011 1615   GFRNONAA 50 (L) 05/08/2021 0110   GFRNONAA 60 09/20/2020 0750   GFRAA 70 09/20/2020 0750    Lipid Panel     Component Value Date/Time   CHOL 152 05/04/2022 1401   TRIG 74 05/04/2022 1401   HDL 52 05/04/2022 1401   CHOLHDL 2.9 05/04/2022 1401   LDLCALC 84 05/04/2022 1401    CBC    Component Value Date/Time   WBC 10.6 05/04/2022 1401   RBC 6.13 (H) 05/04/2022 1401   HGB 17.1 05/04/2022 1401   HGB 14.9 10/10/2011 1615   HCT 51.4 (H) 05/04/2022 1401   HCT 46.9 10/10/2011 1615   PLT 164 05/04/2022 1401   PLT 159 10/10/2011 1615   MCV 83.8 05/04/2022 1401   MCV 87 10/10/2011 1615   MCH 27.9 05/04/2022 1401   MCHC 33.3 05/04/2022 1401   RDW 14.9 05/04/2022 1401   RDW 14.3 10/10/2011 1615   LYMPHSABS 1,448 06/02/2020 0958   EOSABS 193 06/02/2020 0958   BASOSABS 31 06/02/2020 0958    Hgb A1C Lab Results  Component Value Date   HGBA1C 6.2 (H) 05/04/2022  Assessment & Plan:     RTC in 6 months, follow-up chronic conditions Nicki Reaper, NP

## 2022-08-06 ENCOUNTER — Telehealth: Payer: BC Managed Care – PPO

## 2022-08-12 ENCOUNTER — Other Ambulatory Visit: Payer: Self-pay | Admitting: Internal Medicine

## 2022-08-12 DIAGNOSIS — I10 Essential (primary) hypertension: Secondary | ICD-10-CM

## 2022-08-12 DIAGNOSIS — E785 Hyperlipidemia, unspecified: Secondary | ICD-10-CM

## 2022-08-12 DIAGNOSIS — E1165 Type 2 diabetes mellitus with hyperglycemia: Secondary | ICD-10-CM

## 2022-08-14 NOTE — Telephone Encounter (Signed)
Requested Prescriptions  Pending Prescriptions Disp Refills   amLODipine (NORVASC) 5 MG tablet [Pharmacy Med Name: AMLODIPINE BESYLATE 5 MG TAB] 90 tablet 0    Sig: TAKE 1 TABLET (5 MG TOTAL) BY MOUTH DAILY.     Cardiovascular: Calcium Channel Blockers 2 Failed - 08/12/2022  6:39 PM      Failed - Last BP in normal range    BP Readings from Last 1 Encounters:  05/04/22 (!) 148/68         Passed - Last Heart Rate in normal range    Pulse Readings from Last 1 Encounters:  05/04/22 72         Passed - Valid encounter within last 6 months    Recent Outpatient Visits           3 months ago Type 2 diabetes mellitus with hyperglycemia, without long-term current use of insulin (HCC)   [place] [facility], Gentry Fitz A, RPH-CPP   3 months ago Type 2 diabetes mellitus with hyperglycemia, without long-term current use of insulin (HCC)   [place] [facility] Delles, Gentry Fitz A, RPH-CPP   3 months ago Encounter for general adult medical examination with abnormal findings   [place] [facility] [place], Salvadore Oxford, NP   3 months ago No-show for appointment   [place] [facility] [place], Salvadore Oxford, NP   4 months ago Type 2 diabetes mellitus with hyperglycemia, with long-term current use of insulin (HCC)   [place] [facility] Delles, Gentry Fitz A, RPH-CPP               atorvastatin (LIPITOR) 20 MG tablet [Pharmacy Med Name: ATORVASTATIN 20 MG TABLET] 90 tablet 0    Sig: TAKE 1 TABLET BY MOUTH EVERY DAY     Cardiovascular:  Antilipid - Statins Failed - 08/12/2022  6:39 PM      Failed - Lipid Panel in normal range within the last 12 months    Cholesterol  Date Value Ref Range Status  05/04/2022 152 <200 mg/dL Final   LDL Cholesterol (Calc)  Date Value Ref Range Status  05/04/2022 84 mg/dL (calc) Final    Comment:    Reference range: <100 . Desirable range <100 mg/dL for primary  prevention;   <70 mg/dL for patients with CHD or diabetic patients  with > or = 2 CHD risk factors. Marland Kitchen LDL-C is now calculated using the Martin-Hopkins  calculation, which is a validated novel method providing  better accuracy than the Friedewald equation in the  estimation of LDL-C.  Horald Pollen et al. Lenox Ahr. 1610;960(45): 2061-2068  (http://education.[facility].com/faq/FAQ164)    HDL  Date Value Ref Range Status  05/04/2022 52 > OR = 40 mg/dL Final   Triglycerides  Date Value Ref Range Status  05/04/2022 74 <150 mg/dL Final         Passed - Patient is not pregnant      Passed - Valid encounter within last 12 months    Recent Outpatient Visits           3 months ago Type 2 diabetes mellitus with hyperglycemia, without long-term current use of insulin [facility])   [place] [facility] Delles, Gentry Fitz A, RPH-CPP   3 months ago Type 2 diabetes mellitus with hyperglycemia, without long-term current use of insulin [facility])   [place] [facility] Delles, Gentry Fitz A, RPH-CPP   3 months ago Encounter for general adult  medical examination with abnormal findings   [place] [facility] [place], [place], NP   3 months ago No-show for appointment   [facility] [place], Salvadore Oxford, NP   4 months ago Type 2 diabetes mellitus with hyperglycemia, with long-term current use of insulin [facility])   [place] [facility] Delles, Gentry Fitz A, RPH-CPP               metFORMIN (GLUCOPHAGE) 1000 MG tablet [Pharmacy Med Name: METFORMIN HCL 1,000 MG TABLET] 180 tablet 0    Sig: TAKE 1 TABLET (1,000 MG TOTAL) BY MOUTH TWICE A DAY WITH A MEAL     Endocrinology:  Diabetes - Biguanides Failed - 08/12/2022  6:39 PM      Failed - B12 Level in normal range and within 720 days    No results found for: "VITAMINB12"       Failed - CBC within normal limits and completed in the last 12 months    WBC  Date Value  Ref Range Status  05/04/2022 10.6 3.8 - 10.8 Thousand/uL Final   RBC  Date Value Ref Range Status  05/04/2022 6.13 (H) 4.20 - 5.80 Million/uL Final   Hemoglobin  Date Value Ref Range Status  05/04/2022 17.1 13.2 - 17.1 g/dL Final   HGB  Date Value Ref Range Status  10/10/2011 14.9 13.0 - 18.0 g/dL Final   HCT  Date Value Ref Range Status  05/04/2022 51.4 (H) 38.5 - 50.0 % Final  10/10/2011 46.9 40.0 - 52.0 % Final   MCHC  Date Value Ref Range Status  05/04/2022 33.3 32.0 - 36.0 g/dL Final   [facility]  Date Value Ref Range Status  05/04/2022 27.9 27.0 - 33.0 pg Final   MCV  Date Value Ref Range Status  05/04/2022 83.8 80.0 - 100.0 fL Final  10/10/2011 87 80 - 100 fL Final   No results found for: "PLTCOUNTKUC", "LABPLAT", "POCPLA" RDW  Date Value Ref Range Status  05/04/2022 14.9 11.0 - 15.0 % Final  10/10/2011 14.3 11.5 - 14.5 % Final         Passed - Cr in normal range and within 360 days    Creat  Date Value Ref Range Status  05/04/2022 1.03 0.70 - 1.35 mg/dL Final   Creatinine, Urine  Date Value Ref Range Status  05/04/2022 236 20 - 320 mg/dL Final         Passed - HBA1C is between 0 and 7.9 and within 180 days    HbA1c POC (<> result, manual entry)  Date Value Ref Range Status  01/18/2022 9.6 4.0 - 5.6 % Final   Hgb A1c MFr Bld  Date Value Ref Range Status  05/04/2022 6.2 (H) <5.7 % of total Hgb Final    Comment:    For someone without known diabetes, a hemoglobin  A1c value between 5.7% and 6.4% is consistent with prediabetes and should be confirmed with a  follow-up test. . For someone with known diabetes, a value <7% indicates that their diabetes is well controlled. A1c targets should be individualized based on duration of diabetes, age, comorbid conditions, and other considerations. . This assay result is consistent with an increased risk of diabetes. . Currently, no consensus exists regarding use of hemoglobin A1c for diagnosis of diabetes  for children. .          Passed - eGFR in normal range and within 360 days    GFR, Est  African American  Date Value Ref Range Status  09/20/2020 70 > OR = 60 mL/min/1.66m2 Final   GFR, Est Non African American  Date Value Ref Range Status  09/20/2020 60 > OR = 60 mL/min/1.24m2 Final   GFR, Estimated  Date Value Ref Range Status  05/08/2021 50 (L) >60 mL/min Final    Comment:    (NOTE) Calculated using the CKD-EPI Creatinine Equation (2021)    eGFR  Date Value Ref Range Status  05/04/2022 81 > OR = 60 mL/min/1.67m2 Final         Passed - Valid encounter within last 6 months    Recent Outpatient Visits           3 months ago Type 2 diabetes mellitus with hyperglycemia, without long-term current use of insulin (HCC)   [place] [facility] Delles, Gentry Fitz A, RPH-CPP   3 months ago Type 2 diabetes mellitus with hyperglycemia, without long-term current use of insulin [facility])   [place] [facility] Delles, Gentry Fitz A, RPH-CPP   3 months ago Encounter for general adult medical examination with abnormal findings   [place] [facility] [place], Salvadore Oxford, NP   3 months ago No-show for appointment   [facility] [place], Salvadore Oxford, NP   4 months ago Type 2 diabetes mellitus with hyperglycemia, with long-term current use of insulin [facility])   [place] [facility] Delles, Gentry Fitz A, RPH-CPP

## 2022-08-22 ENCOUNTER — Telehealth: Payer: BC Managed Care – PPO

## 2022-08-22 ENCOUNTER — Telehealth: Payer: Self-pay | Admitting: Pharmacist

## 2022-08-22 NOTE — Telephone Encounter (Signed)
   Outreach Note  08/22/2022 Name: Anthony Bell MRN: [id number] DOB: [birth year]  Referred by: Lorre Munroe, NP Reason for referral : No chief complaint on file.   Was unable to reach patient via telephone today and have left HIPAA compliant voicemail asking patient to return my call. Outreach attempt #3.   Follow Up Plan: Will send message to let provider know of unsuccessful attempts to reach patient. Clinical Pharmacist available to engage with patient in the future if needed  Estelle Grumbles, PharmD, [facility] Clinical Pharmacist [facility] [phone]

## 2022-08-23 NOTE — Telephone Encounter (Signed)
noted 

## 2022-09-25 ENCOUNTER — Other Ambulatory Visit: Payer: Self-pay | Admitting: Internal Medicine

## 2022-09-25 DIAGNOSIS — E1165 Type 2 diabetes mellitus with hyperglycemia: Secondary | ICD-10-CM

## 2022-09-26 NOTE — Telephone Encounter (Signed)
Requested medication (s) are due for refill today: Yes  Requested medication (s) are on the active medication list: Yes  Last refill:  05/08/22  Future visit scheduled: No  Notes to clinic:  See pharmacy request.    Requested Prescriptions  Pending Prescriptions Disp Refills   JARDIANCE 10 MG TABS tablet [Pharmacy Med Name: JARDIANCE 10 MG TABLET] 90 tablet 1    Sig: TAKE 1 TABLET BY MOUTH DAILY BEFORE BREAKFAST.     Endocrinology:  Diabetes - SGLT2 Inhibitors Passed - 09/25/2022  5:50 PM      Passed - Cr in normal range and within 360 days    Creat  Date Value Ref Range Status  05/04/2022 1.03 0.70 - 1.35 mg/dL Final   Creatinine, Urine  Date Value Ref Range Status  05/04/2022 236 20 - 320 mg/dL Final         Passed - HBA1C is between 0 and 7.9 and within 180 days    HbA1c POC (<> result, manual entry)  Date Value Ref Range Status  01/18/2022 9.6 4.0 - 5.6 % Final   Hgb A1c MFr Bld  Date Value Ref Range Status  05/04/2022 6.2 (H) <5.7 % of total Hgb Final    Comment:    For someone without known diabetes, a hemoglobin  A1c value between 5.7% and 6.4% is consistent with prediabetes and should be confirmed with a  follow-up test. . For someone with known diabetes, a value <7% indicates that their diabetes is well controlled. A1c targets should be individualized based on duration of diabetes, age, comorbid conditions, and other considerations. . This assay result is consistent with an increased risk of diabetes. . Currently, no consensus exists regarding use of hemoglobin A1c for diagnosis of diabetes for children. .          Passed - eGFR in normal range and within 360 days    GFR, Est African American  Date Value Ref Range Status  09/20/2020 70 > OR = 60 mL/min/1.30m2 Final   GFR, Est Non African American  Date Value Ref Range Status  09/20/2020 60 > OR = 60 mL/min/1.2m2 Final   GFR, Estimated  Date Value Ref Range Status  05/08/2021 50 (L) >60  mL/min Final    Comment:    (NOTE) Calculated using the CKD-EPI Creatinine Equation (2021)    eGFR  Date Value Ref Range Status  05/04/2022 81 > OR = 60 mL/min/1.15m2 Final         Passed - Valid encounter within last 6 months    Recent Outpatient Visits           4 months ago Type 2 diabetes mellitus with hyperglycemia, without long-term current use of insulin [facility])   [place] [facility] D[facility], Gentry Fitz A, RPH-CPP   4 months ago Type 2 diabetes mellitus with hyperglycemia, without long-term current use of insulin [facility])   [place] [facility] D[facility], Gentry Fitz A, RPH-CPP   4 months ago Encounter for general adult medical examination with abnormal findings   [place] [facility] [place], Salvadore Oxford, NP   4 months ago No-show for appointment   [place] [facility] [place], Salvadore Oxford, NP   5 months ago Type 2 diabetes mellitus with hyperglycemia, with long-term current use of insulin [facility])   [facility] Delles, Gentry Fitz A, RPH-CPP

## 2022-09-30 ENCOUNTER — Emergency Department: Payer: Medicare Other

## 2022-09-30 ENCOUNTER — Observation Stay
Admission: EM | Admit: 2022-09-30 | Discharge: 2022-10-01 | Disposition: A | Discharge status: Hospice | Payer: Medicare Other | Attending: Obstetrics and Gynecology | Admitting: Obstetrics and Gynecology

## 2022-09-30 DIAGNOSIS — I10 Essential (primary) hypertension: Secondary | ICD-10-CM | POA: Diagnosis not present

## 2022-09-30 DIAGNOSIS — Z8505 Personal history of malignant neoplasm of liver: Secondary | ICD-10-CM | POA: Diagnosis not present

## 2022-09-30 DIAGNOSIS — C19 Malignant neoplasm of rectosigmoid junction: Secondary | ICD-10-CM | POA: Diagnosis not present

## 2022-09-30 DIAGNOSIS — I110 Hypertensive heart disease with heart failure: Secondary | ICD-10-CM | POA: Diagnosis not present

## 2022-09-30 DIAGNOSIS — Z7982 Long term (current) use of aspirin: Secondary | ICD-10-CM | POA: Insufficient documentation

## 2022-09-30 DIAGNOSIS — E119 Type 2 diabetes mellitus without complications: Secondary | ICD-10-CM

## 2022-09-30 DIAGNOSIS — R55 Syncope and collapse: Secondary | ICD-10-CM

## 2022-09-30 DIAGNOSIS — Z7984 Long term (current) use of oral hypoglycemic drugs: Secondary | ICD-10-CM | POA: Insufficient documentation

## 2022-09-30 DIAGNOSIS — E872 Acidosis, unspecified: Secondary | ICD-10-CM | POA: Diagnosis not present

## 2022-09-30 DIAGNOSIS — N179 Acute kidney failure, unspecified: Principal | ICD-10-CM

## 2022-09-30 DIAGNOSIS — A419 Sepsis, unspecified organism: Secondary | ICD-10-CM | POA: Diagnosis not present

## 2022-09-30 DIAGNOSIS — J449 Chronic obstructive pulmonary disease, unspecified: Secondary | ICD-10-CM | POA: Diagnosis present

## 2022-09-30 DIAGNOSIS — Z79899 Other long term (current) drug therapy: Secondary | ICD-10-CM | POA: Insufficient documentation

## 2022-09-30 DIAGNOSIS — R6521 Severe sepsis with septic shock: Secondary | ICD-10-CM | POA: Diagnosis present

## 2022-09-30 DIAGNOSIS — Z1152 Encounter for screening for COVID-19: Secondary | ICD-10-CM | POA: Diagnosis not present

## 2022-09-30 DIAGNOSIS — Z794 Long term (current) use of insulin: Secondary | ICD-10-CM | POA: Insufficient documentation

## 2022-09-30 DIAGNOSIS — K7469 Other cirrhosis of liver: Secondary | ICD-10-CM

## 2022-09-30 DIAGNOSIS — I509 Heart failure, unspecified: Secondary | ICD-10-CM | POA: Insufficient documentation

## 2022-09-30 DIAGNOSIS — Z85038 Personal history of other malignant neoplasm of large intestine: Secondary | ICD-10-CM | POA: Insufficient documentation

## 2022-09-30 DIAGNOSIS — F17210 Nicotine dependence, cigarettes, uncomplicated: Secondary | ICD-10-CM | POA: Diagnosis not present

## 2022-09-30 LAB — LACTIC ACID, PLASMA
Lactic Acid, Venous: >9 mmol/L (ref 0.5–1.9)
Lactic Acid, Venous: >9 mmol/L (ref 0.5–1.9)

## 2022-09-30 LAB — URINALYSIS, W/ REFLEX TO CULTURE (INFECTION SUSPECTED)
Glucose, UA: NEGATIVE mg/dL
Ketones, ur: NEGATIVE mg/dL
Leukocytes,Ua: NEGATIVE
Nitrite: NEGATIVE
Protein, ur: 30 mg/dL — AB
Specific Gravity, Urine: 1.018 (ref 1.005–1.030)
pH: 5 (ref 5.0–8.0)

## 2022-09-30 LAB — CBC WITH DIFFERENTIAL/PLATELET
Abs Immature Granulocytes: 0.95 10*3/uL — ABNORMAL HIGH (ref 0.00–0.07)
Basophils Absolute: 0.1 10*3/uL (ref 0.0–0.1)
Basophils Relative: 1 %
Eosinophils Absolute: 0 10*3/uL (ref 0.0–0.5)
Eosinophils Relative: 0 %
HCT: 31.9 % — ABNORMAL LOW (ref 39.0–52.0)
Hemoglobin: 10.2 g/dL — ABNORMAL LOW (ref 13.0–17.0)
Immature Granulocytes: 5 %
Lymphocytes Relative: 6 %
Lymphs Abs: 1 10*3/uL (ref 0.7–4.0)
MCH: 22.4 pg — ABNORMAL LOW (ref 26.0–34.0)
MCHC: 32 g/dL (ref 30.0–36.0)
MCV: 70 fL — ABNORMAL LOW (ref 80.0–100.0)
Monocytes Absolute: 1.9 10*3/uL — ABNORMAL HIGH (ref 0.1–1.0)
Monocytes Relative: 11 %
Neutro Abs: 13.8 10*3/uL — ABNORMAL HIGH (ref 1.7–7.7)
Neutrophils Relative %: 77 %
Platelets: 127 10*3/uL — ABNORMAL LOW (ref 150–400)
RBC: 4.56 MIL/uL (ref 4.22–5.81)
RDW: 23.4 % — ABNORMAL HIGH (ref 11.5–15.5)
WBC: 17.7 10*3/uL — ABNORMAL HIGH (ref 4.0–10.5)
nRBC: 18.7 % — ABNORMAL HIGH (ref 0.0–0.2)

## 2022-09-30 LAB — APTT: aPTT: 66 seconds — ABNORMAL HIGH (ref 24–36)

## 2022-09-30 LAB — COMPREHENSIVE METABOLIC PANEL
ALT: 445 U/L — ABNORMAL HIGH (ref 0–44)
AST: 614 U/L — ABNORMAL HIGH (ref 15–41)
Albumin: 2 g/dL — ABNORMAL LOW (ref 3.5–5.0)
Alkaline Phosphatase: 609 U/L — ABNORMAL HIGH (ref 38–126)
Anion gap: 26 — ABNORMAL HIGH (ref 5–15)
BUN: 98 mg/dL — ABNORMAL HIGH (ref 8–23)
CO2: 9 mmol/L — ABNORMAL LOW (ref 22–32)
Calcium: 9.1 mg/dL (ref 8.9–10.3)
Chloride: 103 mmol/L (ref 98–111)
Creatinine, Ser: 3.31 mg/dL — ABNORMAL HIGH (ref 0.61–1.24)
GFR, Estimated: 20 mL/min — ABNORMAL LOW (ref >60)
Glucose, Bld: 105 mg/dL — ABNORMAL HIGH (ref 70–99)
Potassium: 4.7 mmol/L (ref 3.5–5.1)
Sodium: 138 mmol/L (ref 135–145)
Total Bilirubin: 15.7 mg/dL — ABNORMAL HIGH (ref 0.3–1.2)
Total Protein: 5 g/dL — ABNORMAL LOW (ref 6.5–8.1)

## 2022-09-30 LAB — PROTIME-INR
INR: 3.2 — ABNORMAL HIGH (ref 0.8–1.2)
Prothrombin Time: 32.8 seconds — ABNORMAL HIGH (ref 11.4–15.2)

## 2022-09-30 LAB — RESP PANEL BY RT-PCR (RSV, FLU A&B, COVID)  RVPGX2
Influenza A by PCR: NEGATIVE
Influenza B by PCR: NEGATIVE
Resp Syncytial Virus by PCR: NEGATIVE
SARS Coronavirus 2 by RT PCR: NEGATIVE

## 2022-09-30 LAB — TROPONIN I (HIGH SENSITIVITY)
Troponin I (High Sensitivity): 69 ng/L — ABNORMAL HIGH (ref <18)
Troponin I (High Sensitivity): 73 ng/L — ABNORMAL HIGH (ref <18)

## 2022-09-30 LAB — BRAIN NATRIURETIC PEPTIDE: B Natriuretic Peptide: 3979.4 pg/mL — ABNORMAL HIGH (ref 0.0–100.0)

## 2022-09-30 MED ORDER — SODIUM CHLORIDE 0.9 % IV BOLUS (SEPSIS)
1000.0000 mL | Freq: Once | INTRAVENOUS | Status: AC
  Administered 2022-09-30: 1000 mL via INTRAVENOUS

## 2022-09-30 MED ORDER — LACTATED RINGERS IV SOLN: INTRAVENOUS | Status: DC

## 2022-09-30 MED ORDER — SODIUM CHLORIDE 0.9 % IV SOLN
2.0000 g | INTRAVENOUS | Status: DC
  Administered 2022-09-30: 2 g via INTRAVENOUS
  Filled 2022-09-30: qty 20

## 2022-09-30 NOTE — ED Notes (Signed)
Bair hugger applied.

## 2022-09-30 NOTE — Progress Notes (Signed)
Elink monitoring for the code sepsis protocol.  

## 2022-09-30 NOTE — Code Documentation (Signed)
CODE SEPSIS - PHARMACY COMMUNICATION  **Broad Spectrum Antibiotics should be administered within 1 hour of Sepsis diagnosis**  Time Code Sepsis Called/Page Received: 1945  Antibiotics Ordered: ceftriaxone  Time of 1st antibiotic administration: 2020  Additional action taken by pharmacy:   If necessary, Name of Provider/Nurse Contacted:     Sharen Hones ,PharmD Clinical Pharmacist  09/30/2022  7:46 PM

## 2022-09-30 NOTE — ED Triage Notes (Signed)
Pt comes via [facility], comes from home, hx of cancer, got up to go to the bathroom and had an unresponsive episode, hypotensive with EMS in the 80's, confused.

## 2022-09-30 NOTE — ED Provider Notes (Signed)
[facility] Provider Note   Event Date/Time   First MD Initiated Contact with Patient 09/30/22 1925     (approximate) History  Loss of Consciousness  HPI Anthony Bell is a 67 y.o. male with a past medical history of rectal adenocarcinoma with metastasis, COPD, type 2 diabetes, hyperlipidemia, and CHF who presents from home after getting up to use the bathroom and having an episode of unresponsiveness for approximately 2 minutes.  Patient returned to baseline after awakening within approximately 1 minute and has had no syncopal episodes since then.  EMS noted patient to be hypotensive with systolics in the 80s and with some confusion.  Patient's confusion cleared throughout transport to the hospital and fluid administration.  Patient states that he has felt generalized weakness over the past week after having no solid food for the past 2 weeks.  Patient states that he has difficulty swallowing and this is the reason he has been unable to eat solid foods.  Patient states that he has been on hospice through [facility] but they "dropped me" recently and family as well as patient is concerned that patient does not have any hospice care at home.  Of note, patient was recently seen and has only received 2 doses of oral antibiotics for a urinary tract infection. ROS: Patient currently denies any vision changes, tinnitus, difficulty speaking, facial droop, sore throat, chest pain, shortness of breath, abdominal pain, nausea/vomiting/diarrhea, dysuria, or numbness/paresthesias in any extremity   Physical Exam  Triage Vital Signs: ED Triage Vitals  Enc Vitals Group     BP 09/30/22 1936 (!) 80/50     Pulse Rate 09/30/22 1936 87     Resp 09/30/22 1936 (!) 21     Temp 09/30/22 1936 (!) 96 F (35.6 C)     Temp Source 09/30/22 1936 Axillary     SpO2 09/30/22 1936 98 %     Weight 09/30/22 1939 140 lb (63.5 kg)     Height --      Head Circumference --      Peak Flow --      Pain Score  09/30/22 1939 0     Pain Loc --      Pain Edu? --      Excl. in GC? --    Most recent vital signs: Vitals:   09/30/22 2130 09/30/22 2230  BP: 138/80 135/69  Pulse:  93  Resp: 20 18  Temp: (!) 96.6 F (35.9 C) (!) 96.7 F (35.9 C)  SpO2:  100%   General: Awake, oriented x4. CV:  Good peripheral perfusion.  Resp:  Normal effort.  Abd:  No distention.  Other:  Elderly cachectic African-American male laying in bed in no acute distress ED Results / Procedures / Treatments  Labs (all labs ordered are listed, but only abnormal results are displayed) Labs Reviewed  LACTIC ACID, PLASMA - Abnormal; Notable for the following components:      Result Value   Lactic Acid, Venous >9.0 (*)    All other components within normal limits  LACTIC ACID, PLASMA - Abnormal; Notable for the following components:   Lactic Acid, Venous >9.0 (*)    All other components within normal limits  COMPREHENSIVE METABOLIC PANEL - Abnormal; Notable for the following components:   CO2 9 (*)    Glucose, Bld 105 (*)    BUN 98 (*)    Creatinine, Ser 3.31 (*)    Total Protein 5.0 (*)    Albumin 2.0 (*)  AST 614 (*)    ALT 445 (*)    Alkaline Phosphatase 609 (*)    Total Bilirubin 15.7 (*)    GFR, Estimated 20 (*)    Anion gap 26 (*)    All other components within normal limits  CBC WITH DIFFERENTIAL/PLATELET - Abnormal; Notable for the following components:   WBC 17.7 (*)    Hemoglobin 10.2 (*)    HCT 31.9 (*)    MCV 70.0 (*)    MCH 22.4 (*)    RDW 23.4 (*)    Platelets 127 (*)    nRBC 18.7 (*)    Neutro Abs 13.8 (*)    Monocytes Absolute 1.9 (*)    Abs Immature Granulocytes 0.95 (*)    All other components within normal limits  PROTIME-INR - Abnormal; Notable for the following components:   Prothrombin Time 32.8 (*)    INR 3.2 (*)    All other components within normal limits  APTT - Abnormal; Notable for the following components:   aPTT 66 (*)    All other components within normal limits   BRAIN NATRIURETIC PEPTIDE - Abnormal; Notable for the following components:   B Natriuretic Peptide 3,979.4 (*)    All other components within normal limits  TROPONIN I (HIGH SENSITIVITY) - Abnormal; Notable for the following components:   Troponin I (High Sensitivity) 69 (*)    All other components within normal limits  TROPONIN I (HIGH SENSITIVITY) - Abnormal; Notable for the following components:   Troponin I (High Sensitivity) 73 (*)    All other components within normal limits  RESP PANEL BY RT-PCR (RSV, FLU A&B, COVID)  RVPGX2  CULTURE, BLOOD (ROUTINE X 2)  CULTURE, BLOOD (ROUTINE X 2)  URINALYSIS, W/ REFLEX TO CULTURE (INFECTION SUSPECTED)   EKG ED ECG REPORT I, Merwyn Katos, the attending physician, personally viewed and interpreted this ECG. Date: 09/30/2022 EKG Time: 1937 Rate: 88 Rhythm: normal sinus rhythm QRS Axis: normal Intervals: normal ST/T Wave abnormalities: normal Narrative Interpretation: no evidence of acute ischemia RADIOLOGY ED MD interpretation: One-view portable chest x-ray interpreted by me shows no evidence of acute abnormalities including no pneumonia, pneumothorax, or widened mediastinum -Agree with radiology assessment Official radiology report(s): DG Chest Port 1 View  Result Date: 09/30/2022 CLINICAL DATA:  Syncope and hypotension. EXAM: PORTABLE CHEST 1 VIEW COMPARISON:  June 01, 2015 FINDINGS: A right-sided venous Port-A-Cath is seen with its distal tip noted within the right atrium. This is likely due to the degree of patient inspiration. The heart size and mediastinal contours are within normal limits. Low lung volumes are noted with mild atelectasis seen within the bilateral lung bases. There is no evidence of acute infiltrate, pleural effusion or pneumothorax. The visualized skeletal structures are unremarkable. IMPRESSION: Low lung volumes with mild bibasilar atelectasis. Electronically Signed   By: Aram Candela M.D.   On: 09/30/2022  20:10   PROCEDURES: Critical Care performed: Yes, see critical care procedure note(s) .1-3 Lead EKG Interpretation  Performed by: Merwyn Katos, MD Authorized by: Merwyn Katos, MD     Interpretation: normal     ECG rate:  71   ECG rate assessment: normal     Rhythm: sinus rhythm     Ectopy: none     Conduction: normal   CRITICAL CARE Performed by: Merwyn Katos  Total critical care time: 33 minutes  Critical care time was exclusive of separately billable procedures and treating other patients.  Critical care was necessary  to treat or prevent imminent or life-threatening deterioration.  Critical care was time spent personally by me on the following activities: development of treatment plan with patient and/or surrogate as well as nursing, discussions with consultants, evaluation of patient's response to treatment, examination of patient, obtaining history from patient or surrogate, ordering and performing treatments and interventions, ordering and review of laboratory studies, ordering and review of radiographic studies, pulse oximetry and re-evaluation of patient's condition.  MEDICATIONS ORDERED IN ED: Medications  lactated ringers infusion ( Intravenous New Bag/Given 09/30/22 2021)  cefTRIAXone (ROCEPHIN) 2 g in sodium chloride 0.9 % 100 mL IVPB (0 g Intravenous Stopped 09/30/22 2141)  sodium chloride 0.9 % bolus 1,000 mL (0 mLs Intravenous Stopped 09/30/22 2141)    And  sodium chloride 0.9 % bolus 1,000 mL (1,000 mLs Intravenous New Bag/Given 09/30/22 2022)   IMPRESSION / MDM / ASSESSMENT AND PLAN / ED COURSE  I reviewed the triage vital signs and the nursing notes.                             The patient is on the cardiac monitor to evaluate for evidence of arrhythmia and/or significant heart rate changes. Patient's presentation is most consistent with acute presentation with potential threat to life or bodily function. The Pt presents with generalized weakness highly  concerning for sepsis (suspected urinary source). At this time, the Pt is satting well on room air, normotensive, and appears HDS.  Will start empiric antibiotics and fluids.  Due to hypotension, will administer fluids gradually with frequent reassessment. Have low suspicion for a GI, skin/soft tissue, or CNS source at this time, but will reconsider if initial workup is unremarkable.  - CBC, BMP, LFTs - VBG - UA - BCx x2, Lactate - EKG - CXR - Empiric Abx: Rocephin - Fluids: 30cc/kgNS Spoke with patient at length about concerns for multiple endorgan failure including heart, kidneys, and liver as well as the ability to treat the possible urinary tract infection that may be going on.  Family is very concerned that patient has been unable to tolerate p.o. at home as well as not having hospice at home to continue to care for him.  It was explained that despite IV antibiotics and fluid resuscitation, patient's organ dysfunction will likely not improve significantly and they expressed understanding stating that they wish to have hospice at home.  I spoke to Dr. Allena Katz and internal medicine service who agrees to see and evaluate the patient.  Dispo: Admit to medicine Clinical Course as of 09/30/22 2324  Wynelle Link Sep 30, 2022  2036 Resp(!): 21 [EB]  2308 Metastatic colon cancer - hospice consulted. La > 9.  Given abx and fluids. DNR/DNI.  Plan for admit to hospitalist.  [SM]    Clinical Course User Index [EB] Merwyn Katos, MD [SM] Corena Herter, MD   FINAL CLINICAL IMPRESSION(S) / ED DIAGNOSES   Final diagnoses:  Syncope and collapse  Acute renal failure, unspecified acute renal failure type (HCC)  Decompensated liver disease (HCC)  Sepsis with acute renal failure and septic shock, due to unspecified organism, unspecified acute renal failure type (HCC)   Rx / DC Orders   ED Discharge Orders     None      Note:  This document was prepared using Dragon voice recognition software and may  include unintentional dictation errors.   Merwyn Katos, MD 09/30/22 [phone]

## 2022-09-30 NOTE — H&P (Signed)
History and Physical    Patient: Anthony Bell WGN:[id number] DOB: 1956/02/20 DOA: 09/30/2022 DOS: the patient was seen and examined on 10/01/2022 PCP: Lorre Munroe, NP  Patient coming from: Home   Chief Complaint:  Chief Complaint  Patient presents with   Loss of Consciousness    HPI: Anthony Bell is a 67 y.o. 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 Pt in septic shock on arrival and hypothermic. Wife and sons at bedside wish fro him to be admitted fro ivf and antibiotics. Wife wants to keep him DNR. In event his heart stops or pt stops breathing no resuscitation and if he cont to drop his bp may try IVF bolus but no aggressive therapy or icu per wife as he has suffered and he said earleir he does not want to suffer  HPI is limited otherwise Labs shows AKI of 3.3 and abnormal lft 2/2 mets. BNP of 3979.0. lactic acidosis of >9.0. leucocytosis of 17.7, anemia of 10.2 and thrombocytopenia. INR is 3.2. Family is familiar with patient's terminal prognosis at once patient to be admitted for fluids IV antibiotics and hospice care.   In ed pt is awake and intermittently confused.  Vitals shows temp improved.  BP low with systolic of 90's.    Review of Systems: Review of Systems  Unable to perform ROS: Acuity of condition   Past Medical History:  Diagnosis Date   Colon polyps    Diabetes mellitus without complication (HCC)    GERD (gastroesophageal reflux disease)    Hypertension    Rectal cancer (HCC)    [facility]   Past Surgical History:  Procedure Laterality Date   COLONOSCOPY WITH PROPOFOL N/A 09/21/2014   Procedure: COLONOSCOPY WITH PROPOFOL;  Surgeon: Christena Deem, MD;  Location: [facility] ENDOSCOPY;  Service: Endoscopy;  Laterality: N/A;   ESOPHAGOGASTRODUODENOSCOPY N/A 09/21/2014   Procedure: ESOPHAGOGASTRODUODENOSCOPY (EGD);  Surgeon: Christena Deem, MD;  Location: [facility] ENDOSCOPY;  Service: Endoscopy;  Laterality: N/A;   lymphnode removed      Social History:  reports that he has been smoking cigarettes. He has a 40.00 pack-year smoking history. He has never used smokeless tobacco. He reports that he does not drink alcohol and does not use drugs.  No Known Allergies  Family History  Problem Relation Age of Onset   Diabetes Mother    Cancer Father    Diabetes Father    Diabetes Sister    Heart attack Neg Hx    Stroke Neg Hx     Prior to Admission medications   Medication Sig Start Date End Date Taking? Authorizing Provider  amLODipine (NORVASC) 5 MG tablet TAKE 1 TABLET (5 MG TOTAL) BY MOUTH DAILY. 08/14/22   Lorre Munroe, NP  aspirin EC 81 MG tablet Take 81 mg by mouth daily. Swallow whole.    [provider]  atorvastatin (LIPITOR) 20 MG tablet TAKE 1 TABLET BY MOUTH EVERY DAY 08/14/22   Lorre Munroe, NP  blood glucose meter kit and supplies KIT Dispense based on patient and insurance preference. Use up to four times daily as directed. 100 strips and lancets please 06/02/20   Gabriel Cirri, NP  Blood Glucose Monitoring Suppl (CONTOUR NEXT EZ) w/Device KIT USE UP TO FOUR TIMES DAILY AS DIRECTED. (FOR ICD-10 E10.9, E11.9). 06/06/20   Karamalegos, Netta Neat, DO  CONTOUR NEXT TEST test strip USE AS DIRECTED UPTO 4 TIMES A DAY 06/28/20   Gabriel Cirri, NP  dexamethasone (  DECADRON) 4 MG tablet Take 8mg  (2 x 4mg  tablets) by mouth in the morning for 2 days after oxaliplatin chemotherapy, then as directed. 06/16/21   [provider]  empagliflozin (JARDIANCE) 10 MG TABS tablet Take 1 tablet (10 mg total) by mouth daily before breakfast. 05/08/22   Lorre Munroe, NP  insulin degludec (TRESIBA) 100 UNIT/ML FlexTouch Pen Inject 20 Units into the skin daily. 01/19/22   Lorre Munroe, NP  Insulin Pen Needle 31G X 5 MM MISC BD Pen Needles- brand specific Inject insulin via insulin pen 6 x daily 03/14/22   Lorre Munroe, NP  lisinopril (ZESTRIL) 5 MG tablet Take 1 tablet (5 mg total) by mouth daily. 05/04/22    Lorre Munroe, NP  magnesium oxide (MAG-OX) 400 MG tablet Take 2 tablets (800 mg total) by mouth once daily 05/04/22   Lorre Munroe, NP  metFORMIN (GLUCOPHAGE) 1000 MG tablet TAKE 1 TABLET (1,000 MG TOTAL) BY MOUTH TWICE A DAY WITH A MEAL 08/14/22   Baity, Salvadore Oxford, NP  ondansetron (ZOFRAN-ODT) 4 MG disintegrating tablet Take 1 tablet (4 mg total) by mouth every 8 (eight) hours as needed for nausea or vomiting. 05/04/22   Lorre Munroe, NP  potassium chloride SA (KLOR-CON M) 20 MEQ tablet Take 1 tablet (20 mEq total) by mouth once daily 01/04/22   [provider]     Vitals:   09/30/22 2030 09/30/22 2100 09/30/22 2130 09/30/22 2230  BP: 103/66  138/80 135/69  Pulse:    93  Resp: 16 15 20 18   Temp: (!) 97.1 F (36.2 C) (!) 96.7 F (35.9 C) (!) 96.6 F (35.9 C) (!) 96.7 F (35.9 C)  TempSrc:      SpO2:    100%  Weight:       Physical Exam Vitals reviewed.  Constitutional:      General: He is not in acute distress.    Appearance: He is not ill-appearing.  HENT:     Right Ear: External ear normal.     Left Ear: External ear normal.  Eyes:     Extraocular Movements: Extraocular movements intact.     Pupils: Pupils are equal, round, and reactive to light.  Cardiovascular:     Rate and Rhythm: Regular rhythm. Tachycardia present.     Pulses: Normal pulses.     Heart sounds: Normal heart sounds.  Pulmonary:     Effort: Pulmonary effort is normal.     Breath sounds: Normal breath sounds.  Abdominal:     General: There is distension.     Palpations: Abdomen is soft.     Tenderness: There is no abdominal tenderness.     Hernia: No hernia is present.  Musculoskeletal:     Right lower leg: Edema present.     Left lower leg: Edema present.  Neurological:     Mental Status: He is alert.      Labs on Admission: I have personally reviewed following labs and imaging studies  CBC: Recent Labs  Lab 09/30/22 2014  WBC 17.7*  NEUTROABS 13.8*  HGB 10.2*  HCT 31.9*   MCV 70.0*  PLT 127*   Basic Metabolic Panel: Recent Labs  Lab 09/30/22 2014  NA 138  K 4.7  CL 103  CO2 9*  GLUCOSE 105*  BUN 98*  CREATININE 3.31*  CALCIUM 9.1   GFR: CrCl cannot be calculated (Unknown ideal weight.). Liver Function Tests: Recent Labs  Lab 09/30/22 2014  AST 614*  ALT 445*  ALKPHOS 609*  BILITOT 15.7*  PROT 5.0*  ALBUMIN 2.0*   No results for input(s): "LIPASE", "AMYLASE" in the last 168 hours. No results for input(s): "AMMONIA" in the last 168 hours. Coagulation Profile: Recent Labs  Lab 09/30/22 2014  INR 3.2*   Cardiac Enzymes: No results for input(s): "CKTOTAL", "CKMB", "CKMBINDEX", "TROPONINI" in the last 168 hours. BNP (last 3 results) No results for input(s): "PROBNP" in the last 8760 hours. HbA1C: No results for input(s): "HGBA1C" in the last 72 hours. CBG: No results for input(s): "GLUCAP" in the last 168 hours. Lipid Profile: No results for input(s): "CHOL", "HDL", "LDLCALC", "TRIG", "CHOLHDL", "LDLDIRECT" in the last 72 hours. Thyroid Function Tests: No results for input(s): "TSH", "T4TOTAL", "FREET4", "T3FREE", "THYROIDAB" in the last 72 hours. Anemia Panel: No results for input(s): "VITAMINB12", "FOLATE", "FERRITIN", "TIBC", "IRON", "RETICCTPCT" in the last 72 hours. Urine analysis: Urinalysis    Component Value Date/Time   COLORURINE AMBER (A) 09/30/2022 2328   APPEARANCEUR CLOUDY (A) 09/30/2022 2328   LABSPEC 1.018 09/30/2022 2328   PHURINE 5.0 09/30/2022 2328   GLUCOSEU NEGATIVE 09/30/2022 2328   HGBUR SMALL (A) 09/30/2022 2328   BILIRUBINUR MODERATE (A) 09/30/2022 2328   BILIRUBINUR negative 06/07/2020 0826   KETONESUR NEGATIVE 09/30/2022 2328   PROTEINUR 30 (A) 09/30/2022 2328   UROBILINOGEN 0.2 06/07/2020 0826   NITRITE NEGATIVE 09/30/2022 2328   LEUKOCYTESUR NEGATIVE 09/30/2022 2328      Radiological Exams on Admission: DG Chest Port 1 View  Result Date: 09/30/2022 CLINICAL DATA:  Syncope and  hypotension. EXAM: PORTABLE CHEST 1 VIEW COMPARISON:  June 01, 2015 FINDINGS: A right-sided venous Port-A-Cath is seen with its distal tip noted within the right atrium. This is likely due to the degree of patient inspiration. The heart size and mediastinal contours are within normal limits. Low lung volumes are noted with mild atelectasis seen within the bilateral lung bases. There is no evidence of acute infiltrate, pleural effusion or pneumothorax. The visualized skeletal structures are unremarkable. IMPRESSION: Low lung volumes with mild bibasilar atelectasis. Electronically Signed   By: Aram Candela M.D.   On: 09/30/2022 20:10     Data Reviewed: Relevant notes from primary care and specialist visits, past discharge summaries as available in EHR, including Care Everywhere. Prior diagnostic testing as pertinent to current admission diagnoses Updated medications and problem lists for reconciliation ED course, including vitals, labs, imaging, treatment and response to treatment Triage notes, nursing and pharmacy notes and ED provider's notes Notable results as noted in HPI Assessment and Plan: * Colorectal cancer (HCC) Terminal prognosis.  PRN morphine for pain. CM consult for hospice.   Septic shock (HCC) Pt hypotensive from liver disease I do not suspect infection. Pt started on rocephin for presumed UTI.    DVT prophylaxis:  Heparin  Consults:  None.   Advance Care Planning:    Code Status: Not on file   Family Communication:  WIFE AND SONS AND DAUGHTER - MS NANCY AT BEDSIDE.  Disposition Plan:  Back to previous home environment  Severity of Illness: The appropriate patient status for this patient is INPATIENT. Inpatient status is judged to be reasonable and necessary in order to provide the required intensity of service to ensure the patient's safety. The patient's presenting symptoms, physical exam findings, and initial radiographic and laboratory data in the context of  their chronic comorbidities is felt to place them at high risk for further clinical deterioration. Furthermore, it is not anticipated that the patient  will be medically stable for discharge from the hospital within 2 midnights of admission.   * I certify that at the point of admission it is my clinical judgment that the patient will require inpatient hospital care spanning beyond 2 midnights from the point of admission due to high intensity of service, high risk for further deterioration and high frequency of surveillance required.*  Author: Gertha Calkin, MD 10/01/2022 12:10 AM  For on call review [email or web address].

## 2022-10-01 ENCOUNTER — Encounter: Payer: Self-pay | Admitting: Internal Medicine

## 2022-10-01 ENCOUNTER — Observation Stay: Payer: Medicare Other

## 2022-10-01 ENCOUNTER — Other Ambulatory Visit: Payer: Self-pay

## 2022-10-01 DIAGNOSIS — E872 Acidosis, unspecified: Secondary | ICD-10-CM | POA: Insufficient documentation

## 2022-10-01 DIAGNOSIS — C19 Malignant neoplasm of rectosigmoid junction: Secondary | ICD-10-CM | POA: Diagnosis present

## 2022-10-01 DIAGNOSIS — A419 Sepsis, unspecified organism: Secondary | ICD-10-CM | POA: Diagnosis present

## 2022-10-01 DIAGNOSIS — N179 Acute kidney failure, unspecified: Principal | ICD-10-CM

## 2022-10-01 LAB — HIV ANTIBODY (ROUTINE TESTING W REFLEX): HIV Screen 4th Generation wRfx: NONREACTIVE

## 2022-10-01 MED ORDER — GLYCOPYRROLATE 0.2 MG/ML IJ SOLN: 0.2000 mg | INTRAMUSCULAR | Status: DC | PRN

## 2022-10-01 MED ORDER — MIDAZOLAM HCL 2 MG/2ML IJ SOLN: 2.0000 mg | INTRAMUSCULAR | Status: DC | PRN

## 2022-10-01 MED ORDER — SODIUM CHLORIDE 0.9 % IV SOLN: INTRAVENOUS | Status: DC

## 2022-10-01 MED ORDER — MORPHINE SULFATE (PF) 2 MG/ML IV SOLN
2.0000 mg | INTRAVENOUS | Status: DC | PRN
  Administered 2022-10-01: 2 mg via INTRAVENOUS
  Filled 2022-10-01: qty 1

## 2022-10-01 MED ORDER — ONDANSETRON 4 MG PO TBDP: 4.0000 mg | ORAL_TABLET | Freq: Four times a day (QID) | ORAL | Status: DC | PRN

## 2022-10-01 MED ORDER — SODIUM CHLORIDE 0.9% FLUSH
3.0000 mL | Freq: Two times a day (BID) | INTRAVENOUS | Status: DC
  Administered 2022-10-01: 3 mL via INTRAVENOUS

## 2022-10-01 MED ORDER — POLYVINYL ALCOHOL 1.4 % OP SOLN: 1.0000 [drp] | Freq: Four times a day (QID) | OPHTHALMIC | Status: DC | PRN

## 2022-10-01 MED ORDER — LACTATED RINGERS IV SOLN: INTRAVENOUS | Status: DC

## 2022-10-01 MED ORDER — DIPHENHYDRAMINE HCL 50 MG/ML IJ SOLN: 25.0000 mg | INTRAMUSCULAR | Status: DC | PRN

## 2022-10-01 MED ORDER — GLYCOPYRROLATE 1 MG PO TABS: 1.0000 mg | ORAL_TABLET | ORAL | Status: DC | PRN

## 2022-10-01 MED ORDER — HALOPERIDOL LACTATE 5 MG/ML IJ SOLN: 2.5000 mg | INTRAMUSCULAR | Status: DC | PRN

## 2022-10-01 MED ORDER — ONDANSETRON HCL 4 MG/2ML IJ SOLN: 4.0000 mg | Freq: Four times a day (QID) | INTRAMUSCULAR | Status: DC | PRN

## 2022-10-01 MED ORDER — ACETAMINOPHEN 325 MG PO TABS: 650.0000 mg | ORAL_TABLET | Freq: Four times a day (QID) | ORAL | Status: DC | PRN

## 2022-10-01 MED ORDER — MORPHINE SULFATE (PF) 2 MG/ML IV SOLN: 2.0000 mg | INTRAVENOUS | Status: DC | PRN

## 2022-10-01 MED ORDER — ACETAMINOPHEN 325 MG RE SUPP: 650.0000 mg | Freq: Four times a day (QID) | RECTAL | Status: DC | PRN

## 2022-10-01 NOTE — Progress Notes (Signed)
Civil engineer, contracting [facility]) Hospital Liaison Note  Received request from Transitions of Care Manager  Dasha for family interest in Hospice Home.  Patient has been approved for Hospice Home. Consent forms have been completed.  EMS notified of patient D/C and transport arranged for 4 pm. TOC/Dasha & Morrie Sheldon and Attending Physician/Dr. Ashok Pall also notified of transport arrangement.   Please send signed DNR form with patient and RN call report to is [phone]   Eugenie Birks, MSW [facility] Liaison [phone]

## 2022-10-01 NOTE — ED Notes (Signed)
Housekeeping stepping into room to change trash and notified RN that pt was in the floor. Pt reports that he slid out of the bed by scooting down to the bottom by accident. Pt reports no injuries or pain, denies hitting head, pt assisted back to bed by nursing staff. MD notified and SZP placed.

## 2022-10-01 NOTE — Assessment & Plan Note (Signed)
Pt hypotensive from liver disease I do not suspect infection. Pt started on rocephin for presumed UTI.

## 2022-10-01 NOTE — Progress Notes (Signed)
TOC has reached out to [facility] liaison Annice Pih to follow up on MD request for residential hospice for patient per patient/family wishes.   Darolyn Rua, [place], MSW, [place] [phone]

## 2022-10-01 NOTE — Progress Notes (Signed)
Chap responded to an Water engineer. Pt in room with wife, daughter and best friend. Both Pt and family expressed awareness and readiness for the transitioning process. Wife requested a prayer which I was humbled to offer.   10/01/22 0900  Spiritual Encounters  Type of Visit Initial  Care provided to: Pt and family  Referral source Nurse (RN/NT/LPN)  Reason for visit End-of-life  OnCall Visit No  Spiritual Framework  Presenting Themes Coping tools;Rituals and practive  Community/Connection Family;Friend(s)  Patient Stress Factors Loss  Family Stress Factors Loss  Interventions  Spiritual Care Interventions Made Established relationship of care and support;Supported grief process;Bereavement/grief support  Intervention Outcomes  Outcomes Awareness of support;Reduced isolation;Reduced anxiety  Spiritual Care Plan  Spiritual Care Issues Still Outstanding No further spiritual care needs at this time (see row info)  Advance Directives (For Healthcare)  Does Patient Have a Medical Advance Directive? No  Would patient like information on creating a medical advance directive? No - Patient declined  Mental Health Advance Directives  Does Patient Have a Mental Health Advance Directive? No  Would patient like information on creating a mental health advance directive? No - Patient declined

## 2022-10-01 NOTE — ED Notes (Signed)
Pt placed on bed pan, small BM noted.  

## 2022-10-01 NOTE — Assessment & Plan Note (Signed)
Terminal prognosis.  PRN morphine for pain. CM consult for hospice.

## 2022-10-01 NOTE — Discharge Summary (Signed)
Anthony Bell ZOX:[id number] DOB: 28-Nov-1955 DOA: 09/30/2022  PCP: Lorre Munroe, NP  Admit date: 09/30/2022 Discharge date: 10/01/2022  Time spent: 35 minutes    Discharge Diagnoses:  Principal Problem:   AKI (acute kidney injury) (HCC) Active Problems:   Colorectal cancer (HCC)   Essential hypertension   Type 2 diabetes mellitus (HCC)   COPD (chronic obstructive pulmonary disease) (HCC)   Lactic acidosis   Discharge Condition: stable  Diet recommendation: ad lib  Filed Weights   09/30/22 1939  Weight: 63.5 kg    History of present illness:  From admission h and p by dr. Allena Katz: Anthony Bell is a 67 y.o. 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 Pt in septic shock on arrival and hypothermic. Wife and sons at bedside wish fro him to be admitted fro ivf and antibiotics. Wife wants to keep him DNR. In event his heart stops or pt stops breathing no resuscitation and if he cont to drop his bp may try IVF bolus but no aggressive therapy or icu per wife as he has suffered and he said earleir he does not want to suffer  HPI is limited otherwise    Hospital Course:  Metastatic colorectal cancer, recently discharged from [facility] with home hospice, presents with decline at home, family interested in hospice home in this area. On presentation patient encephalopathic with aki, lactic acidosis, worsening LFTs/jaundice, and evidence of heart failure. He has not been eating or drinking and is severely malnourished. Shared with family this is a patient with very poor functional status, terminal cancer, and now multi-organ failure. Shared he is in the dying process and that no interventions will reverse this. Life expectancy likely a matter of days. Family receptive to this information and express appreciation for hearing this in a clear-cut manner. They elect to proceed with full comfort care and inpatient hospice. Discharged to inpatient hospice.    Procedures: none   Consultations: hospice  Discharge Exam: Vitals:   10/01/22 0630 10/01/22 0800  BP: 105/61 92/75  Pulse: 87 87  Resp: 16 17  Temp:  97.6 F (36.4 C)  SpO2: 97% 96%    General: NAD, cachectic Cardiovascular: rrr Respiratory: rales at bases  Discharge Instructions   Discharge Instructions     Diet general   Complete by: As directed    Increase activity slowly   Complete by: As directed       Allergies as of 10/01/2022   No Known Allergies      Medication List     STOP taking these medications    amLODipine 5 MG tablet Commonly known as: NORVASC   aspirin EC 81 MG tablet   atorvastatin 20 MG tablet Commonly known as: LIPITOR   blood glucose meter kit and supplies Kit   Contour Next EZ w/Device Kit   Contour Next Test test strip Generic drug: glucose blood   dexamethasone 4 MG tablet Commonly known as: DECADRON   empagliflozin 10 MG Tabs tablet Commonly known as: Jardiance   hydrochlorothiazide 12.5 MG tablet Commonly known as: HYDRODIURIL   insulin degludec 100 UNIT/ML FlexTouch Pen Commonly known as: TRESIBA   Insulin Pen Needle 31G X 5 MM Misc   lisinopril 5 MG tablet Commonly known as: ZESTRIL   magnesium oxide 400 MG tablet Commonly known as: MAG-OX   metFORMIN 1000 MG tablet Commonly known as: GLUCOPHAGE   ondansetron 4 MG disintegrating tablet Commonly known as: ZOFRAN-ODT   ondansetron 8  MG tablet Commonly known as: ZOFRAN   oxyCODONE 5 MG immediate release tablet Commonly known as: Oxy IR/ROXICODONE   potassium chloride SA 20 MEQ tablet Commonly known as: KLOR-CON M       No Known Allergies    The results of significant diagnostics from this hospitalization (including imaging, microbiology, ancillary and laboratory) are listed below for reference.    Significant Diagnostic Studies: CT HEAD WO CONTRAST ( )  Result Date: 10/01/2022 CLINICAL DATA:  Fall from bed. EXAM: CT HEAD WITHOUT  CONTRAST TECHNIQUE: Contiguous axial images were obtained from the base of the skull through the vertex without intravenous contrast. RADIATION DOSE REDUCTION: This exam was performed according to the departmental dose-optimization program which includes automated exposure control, adjustment of the mA and/or kV according to patient size and/or use of iterative reconstruction technique. COMPARISON:  Head CT 06/01/2015 FINDINGS: Brain: No evidence of acute infarction, hemorrhage, hydrocephalus, extra-axial collection or mass lesion/mass effect. Mild cerebral volume loss. Vascular: No hyperdense vessel or unexpected calcification. Skull: Normal. Negative for fracture or focal lesion. Sinuses/Orbits: No acute finding. IMPRESSION: No evidence of intracranial injury. Electronically Signed   By: Tiburcio Pea M.D.   On: 10/01/2022 05:40   DG Chest Port 1 View  Result Date: 09/30/2022 CLINICAL DATA:  Syncope and hypotension. EXAM: PORTABLE CHEST 1 VIEW COMPARISON:  June 01, 2015 FINDINGS: A right-sided venous Port-A-Cath is seen with its distal tip noted within the right atrium. This is likely due to the degree of patient inspiration. The heart size and mediastinal contours are within normal limits. Low lung volumes are noted with mild atelectasis seen within the bilateral lung bases. There is no evidence of acute infiltrate, pleural effusion or pneumothorax. The visualized skeletal structures are unremarkable. IMPRESSION: Low lung volumes with mild bibasilar atelectasis. Electronically Signed   By: Aram Candela M.D.   On: 09/30/2022 20:10    Microbiology: Recent Results (from the past 240 hour(s))  Blood Culture (routine x 2)     Status: None (Preliminary result)   Collection Time: 09/30/22  8:15 PM   Specimen: BLOOD  Result Value Ref Range Status   Specimen Description BLOOD port  Final   Special Requests   Final    BOTTLES DRAWN AEROBIC AND ANAEROBIC Blood Culture adequate volume   Culture   Final     NO GROWTH < 12 HOURS Performed at [facility], [address], [place], [place] [address]    Report Status PENDING  Incomplete  Resp panel by RT-PCR (RSV, Flu A&B, Covid) Anterior Nasal Swab     Status: None   Collection Time: 09/30/22  8:17 PM   Specimen: Anterior Nasal Swab  Result Value Ref Range Status   SARS Coronavirus 2 by RT PCR NEGATIVE NEGATIVE Final    Comment: (NOTE) SARS-CoV-2 target nucleic acids are NOT DETECTED.  The SARS-CoV-2 RNA is generally detectable in upper respiratory specimens during the acute phase of infection. The lowest concentration of SARS-CoV-2 viral copies this assay can detect is 138 copies/mL. A negative result does not preclude SARS-Cov-2 infection and should not be used as the sole basis for treatment or other patient management decisions. A negative result may occur with  improper specimen collection/handling, submission of specimen other than nasopharyngeal swab, presence of viral mutation(s) within the areas targeted by this assay, and inadequate number of viral copies(<138 copies/mL). A negative result must be combined with clinical observations, patient history, and epidemiological information. The expected result is Negative.  Fact Sheet for  Patients:  BloggerCourse.com  Fact Sheet for Healthcare Providers:  SeriousBroker.it  This test is no t yet approved or cleared by the [place] FDA and  has been authorized for detection and/or diagnosis of SARS-CoV-2 by FDA under an Emergency Use Authorization (EUA). This EUA will remain  in effect (meaning this test can be used) for the duration of the COVID-19 declaration under Section 564(b)(1) of the Act, 21 U.S.C.section 360bbb-3(b)(1), unless the authorization is terminated  or revoked sooner.       Influenza A by PCR NEGATIVE NEGATIVE Final   Influenza B by PCR NEGATIVE NEGATIVE Final    Comment: (NOTE) The Xpert  Xpress SARS-CoV-2/FLU/RSV plus assay is intended as an aid in the diagnosis of influenza from Nasopharyngeal swab specimens and should not be used as a sole basis for treatment. Nasal washings and aspirates are unacceptable for Xpert Xpress SARS-CoV-2/FLU/RSV testing.  Fact Sheet for Patients: BloggerCourse.com  Fact Sheet for Healthcare Providers: SeriousBroker.it  This test is not yet approved or cleared by the [place] FDA and has been authorized for detection and/or diagnosis of SARS-CoV-2 by FDA under an Emergency Use Authorization (EUA). This EUA will remain in effect (meaning this test can be used) for the duration of the COVID-19 declaration under Section 564(b)(1) of the Act, 21 U.S.C. section 360bbb-3(b)(1), unless the authorization is terminated or revoked.     Resp Syncytial Virus by PCR NEGATIVE NEGATIVE Final    Comment: (NOTE) Fact Sheet for Patients: BloggerCourse.com  Fact Sheet for Healthcare Providers: SeriousBroker.it  This test is not yet approved or cleared by the [place] FDA and has been authorized for detection and/or diagnosis of SARS-CoV-2 by FDA under an Emergency Use Authorization (EUA). This EUA will remain in effect (meaning this test can be used) for the duration of the COVID-19 declaration under Section 564(b)(1) of the Act, 21 U.S.C. section 360bbb-3(b)(1), unless the authorization is terminated or revoked.  Performed at [facility], [address] [place], [place] [address]   Blood Culture (routine x 2)     Status: None (Preliminary result)   Collection Time: 09/30/22  8:18 PM   Specimen: BLOOD  Result Value Ref Range Status   Specimen Description BLOOD port  Final   Special Requests   Final    BOTTLES DRAWN AEROBIC AND ANAEROBIC Blood Culture adequate volume   Culture   Final    NO GROWTH < 12 HOURS Performed at  [facility], [address], [place], [place] [address]    Report Status PENDING  Incomplete     Labs: Basic Metabolic Panel: Recent Labs  Lab 09/30/22 2014  NA 138  K 4.7  CL 103  CO2 9*  GLUCOSE 105*  BUN 98*  CREATININE 3.31*  CALCIUM 9.1   Liver Function Tests: Recent Labs  Lab 09/30/22 2014  AST 614*  ALT 445*  ALKPHOS 609*  BILITOT 15.7*  PROT 5.0*  ALBUMIN 2.0*   No results for input(s): "LIPASE", "AMYLASE" in the last 168 hours. No results for input(s): "AMMONIA" in the last 168 hours. CBC: Recent Labs  Lab 09/30/22 2014  WBC 17.7*  NEUTROABS 13.8*  HGB 10.2*  HCT 31.9*  MCV 70.0*  PLT 127*   Cardiac Enzymes: No results for input(s): "CKTOTAL", "CKMB", "CKMBINDEX", "TROPONINI" in the last 168 hours. BNP: BNP (last 3 results) Recent Labs    09/30/22 2014  BNP 3,979.4*    ProBNP (last 3 results) No results for input(s): "PROBNP" in the  last 8760 hours.  CBG: No results for input(s): "GLUCAP" in the last 168 hours.     Signed:  Silvano Bilis MD.  [facility] 10/01/2022, 11:44 AM

## 2022-10-01 NOTE — ED Notes (Signed)
Pt resting comfortably at this time. Pt's gown adjusted as well as pt repositioned in bed. Pt denies any other needs at this time. Pt requests bair hugger stays in place. NAD noted.

## 2022-10-01 NOTE — Progress Notes (Signed)
Interim progress note not for billing.  Admitted by my colleague earlier today, agree with that hpi unless otherwise stated.  Metastatic colorectal cancer, recently discharged from [facility] with home hospice, presents with decline at home, family interested in hospice home in this area. On presentation patient encephalopathic with aki, lactic acidosis, worsening LFTs/jaundice, and evidence of heart failure. He has not been eating or drinking and is severely malnourished. Shared with family today this is a patient with very poor functional status, terminal cancer, and now multi-organ failure. Shared he is in the dying process and that no interventions will reverse this. Life expectancy likely a matter of days. Family receptive to this information and express appreciation for hearing this in a clear-cut manner. They elect to proceed with full comfort care which I will order. I will contact the hospice liaison to evaluate for hospice home placement. Anticipate discharge to hospice home versus in-hospital death.

## 2022-10-01 NOTE — ED Provider Notes (Signed)
Care assumed of patient from outgoing provider.  See their note for initial history, exam and plan.  Clinical Course as of 10/01/22 0006  Wynelle Link Sep 30, 2022  2036 Resp(!): 21 [EB]  2308 Metastatic colon cancer - hospice consulted. La > 9.  Given abx and fluids. DNR/DNI.  Plan for admit to hospitalist.  [SM]    Clinical Course User Index [EB] Vicente Males Clent Jacks, MD [SM] Corena Herter, MD     Corena Herter, MD 10/01/22 [phone]

## 2022-10-02 LAB — URINE CULTURE: Culture: NO GROWTH

## 2022-10-05 LAB — CULTURE, BLOOD (ROUTINE X 2)
Culture: NO GROWTH
Culture: NO GROWTH
Special Requests: ADEQUATE
Special Requests: ADEQUATE

## 2022-10-25 DEATH — deceased

## 2022-11-29 ENCOUNTER — Other Ambulatory Visit: Payer: Self-pay | Admitting: Internal Medicine

## 2022-11-29 DIAGNOSIS — E1165 Type 2 diabetes mellitus with hyperglycemia: Secondary | ICD-10-CM

## 2022-11-29 NOTE — Telephone Encounter (Signed)
Requested medications are due for refill today.  unsure  Requested medications are on the active medications list.  no  Last refill. unsure  Future visit scheduled.   no  Notes to clinic.  Medication was d/c'd 10/01/2022. Pt has no listed medications. Please advise.    Requested Prescriptions  Pending Prescriptions Disp Refills   metFORMIN (GLUCOPHAGE) 1000 MG tablet [Pharmacy Med Name: METFORMIN HCL 1,000 MG TABLET] 180 tablet 0    Sig: TAKE 1 TABLET (1,000 MG TOTAL) BY MOUTH TWICE A DAY WITH A MEAL     Endocrinology:  Diabetes - Biguanides Failed - 11/29/2022  1:35 AM      Failed - Cr in normal range and within 360 days    Creat  Date Value Ref Range Status  05/04/2022 1.03 0.70 - 1.35 mg/dL Final   Creatinine, Ser  Date Value Ref Range Status  09/30/2022 3.31 (H) 0.61 - 1.24 mg/dL Final   Creatinine, Urine  Date Value Ref Range Status  05/04/2022 236 20 - 320 mg/dL Final         Failed - HBA1C is between 0 and 7.9 and within 180 days    HbA1c POC (<> result, manual entry)  Date Value Ref Range Status  01/18/2022 9.6 4.0 - 5.6 % Final   Hgb A1c MFr Bld  Date Value Ref Range Status  05/04/2022 6.2 (H) <5.7 % of total Hgb Final    Comment:    For someone without known diabetes, a hemoglobin  A1c value between 5.7% and 6.4% is consistent with prediabetes and should be confirmed with a  follow-up test. . For someone with known diabetes, a value <7% indicates that their diabetes is well controlled. A1c targets should be individualized based on duration of diabetes, age, comorbid conditions, and other considerations. . This assay result is consistent with an increased risk of diabetes. . Currently, no consensus exists regarding use of hemoglobin A1c for diagnosis of diabetes for children. .          Failed - eGFR in normal range and within 360 days    GFR, Est African American  Date Value Ref Range Status  09/20/2020 70 > OR = 60 mL/min/1.94m2 Final   GFR,  Est Non African American  Date Value Ref Range Status  09/20/2020 60 > OR = 60 mL/min/1.81m2 Final   GFR, Estimated  Date Value Ref Range Status  09/30/2022 20 (L) >60 mL/min Final    Comment:    (NOTE) Calculated using the CKD-EPI Creatinine Equation (2021)    eGFR  Date Value Ref Range Status  05/04/2022 81 > OR = 60 mL/min/1.33m2 Final         Failed - B12 Level in normal range and within 720 days    No results found for: "VITAMINB12"       Failed - Valid encounter within last 6 months    Recent Outpatient Visits           6 months ago Type 2 diabetes mellitus with hyperglycemia, without long-term current use of insulin [facility])   [place] [facility] [place], Gentry Fitz A, RPH-CPP   6 months ago Type 2 diabetes mellitus with hyperglycemia, without long-term current use of insulin [facility])   [place] [facility] D[facility], Gentry Fitz A, RPH-CPP   6 months ago Encounter for general adult medical examination with abnormal findings   [facility] [place], Salvadore Oxford, NP   7 months ago No-show  for appointment   [facility] [place], Salvadore Oxford, NP   7 months ago Type 2 diabetes mellitus with hyperglycemia, with long-term current use of insulin [facility])   [place] [facility] D[facility], Gentry Fitz A, RPH-CPP              Passed - CBC within normal limits and completed in the last 12 months    WBC  Date Value Ref Range Status  09/30/2022 17.7 (H) 4.0 - 10.5 K/uL Final   RBC  Date Value Ref Range Status  09/30/2022 4.56 4.22 - 5.81 MIL/uL Final   Hemoglobin  Date Value Ref Range Status  09/30/2022 10.2 (L) 13.0 - 17.0 g/dL Final   HGB  Date Value Ref Range Status  10/10/2011 14.9 13.0 - 18.0 g/dL Final   HCT  Date Value Ref Range Status  09/30/2022 31.9 (L) 39.0 - 52.0 % Final  10/10/2011 46.9 40.0 - 52.0 % Final   MCHC  Date Value Ref Range Status  09/30/2022 32.0 30.0 -  36.0 g/dL Final   [facility]  Date Value Ref Range Status  09/30/2022 22.4 (L) 26.0 - 34.0 pg Final   MCV  Date Value Ref Range Status  09/30/2022 70.0 (L) 80.0 - 100.0 fL Final  10/10/2011 87 80 - 100 fL Final   No results found for: "PLTCOUNTKUC", "LABPLAT", "POCPLA" RDW  Date Value Ref Range Status  09/30/2022 23.4 (H) 11.5 - 15.5 % Final  10/10/2011 14.3 11.5 - 14.5 % Final

## 2023-02-28 IMAGING — CT CT ABD-PELV W/ CM
2 of 5 series · 15 of 46 positions shown, 17 images · IV contrast (APPLIED)
Comparison: None.

CLINICAL DATA: Left lower quadrant abdominal pain.

EXAM:
CT ABDOMEN AND PELVIS WITH CONTRAST
TECHNIQUE: Multidetector CT imaging of the abdomen and pelvis was performed
using the standard protocol following bolus administration of
intravenous contrast.

[Series 2: routine abd/pel with · axial · 0.84mm/px · z∈[-992,-497]mm · 12 of 111 slices shown, 14 images]
[im 6/111  soft-tissue]
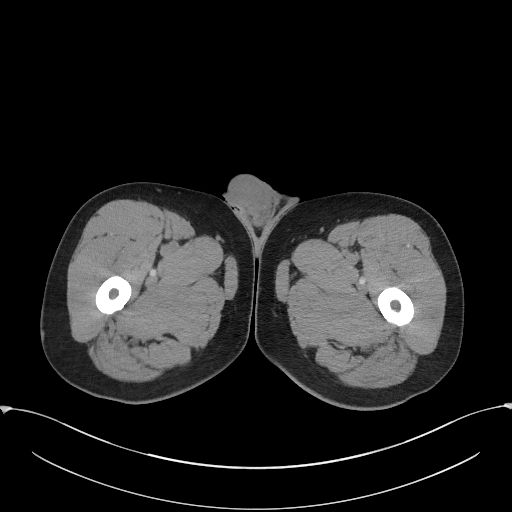
[im 6/111  bone]
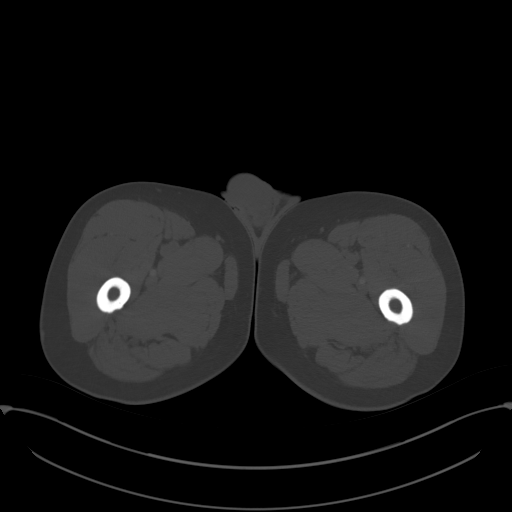
[im 18/111  soft-tissue]
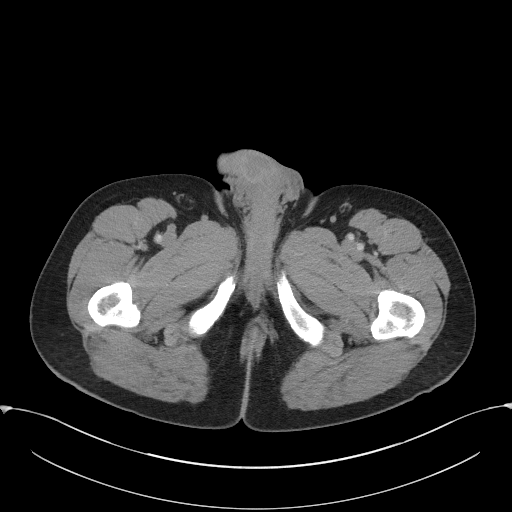
[im 24/111  soft-tissue]
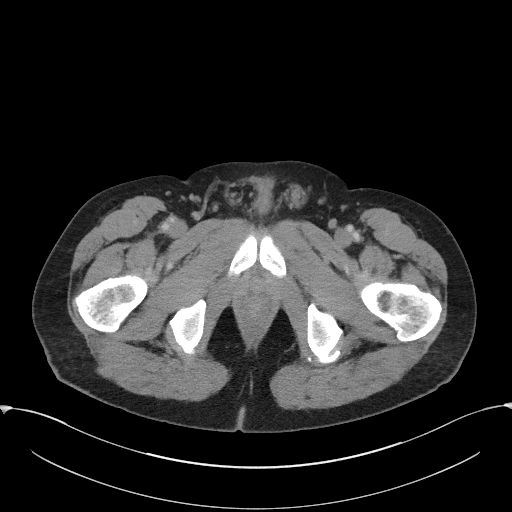
[im 35/111  soft-tissue]
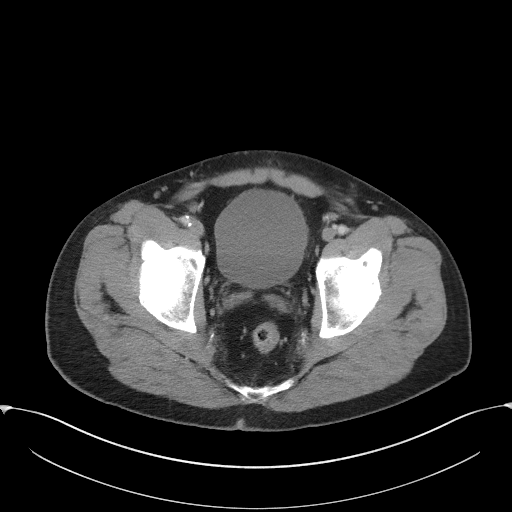
[im 41/111  soft-tissue]
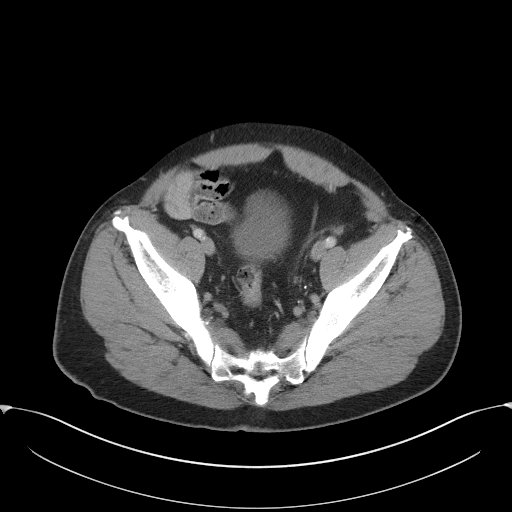
[im 53/111  soft-tissue]
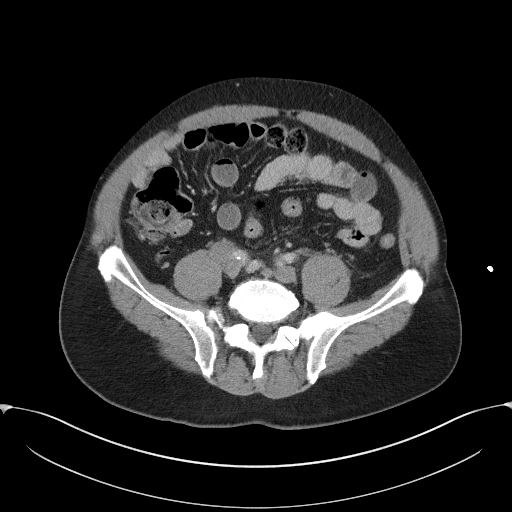
[im 58/111  soft-tissue]
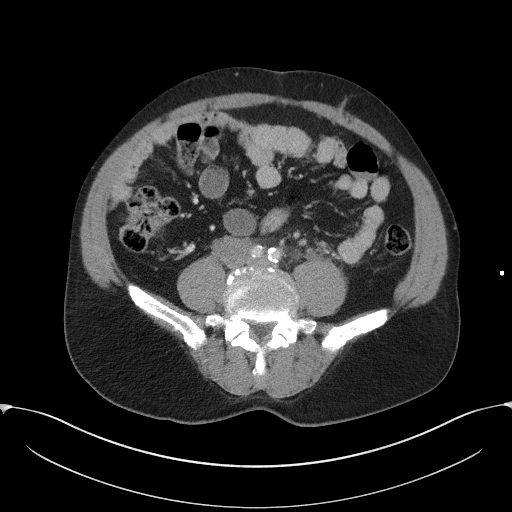
[im 70/111  soft-tissue]
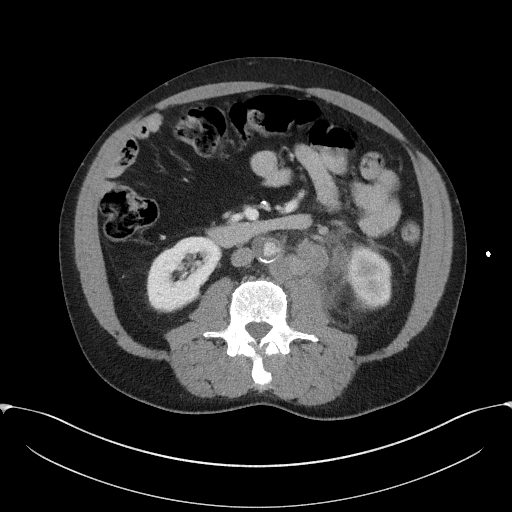
[im 76/111  soft-tissue]
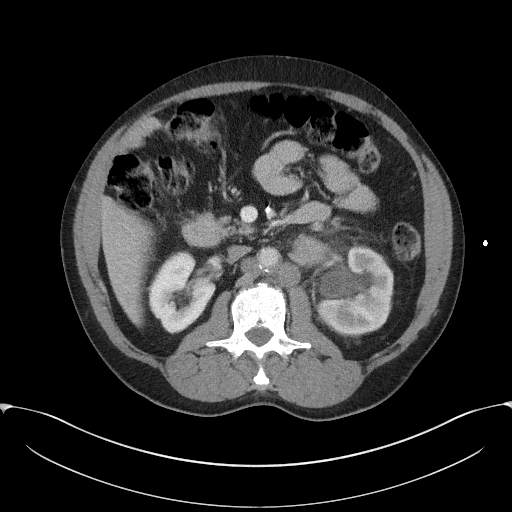
[im 76/111  bone]
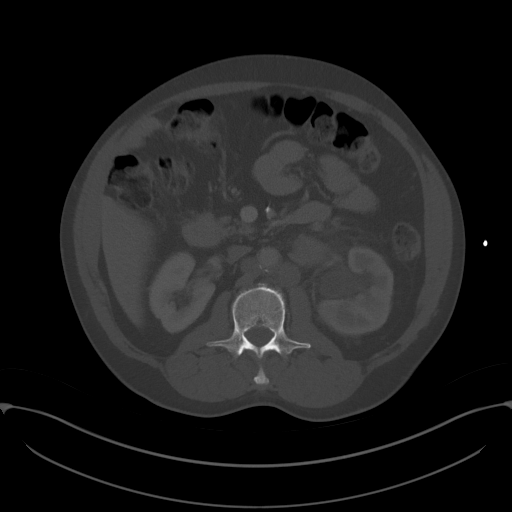
[im 87/111  soft-tissue]
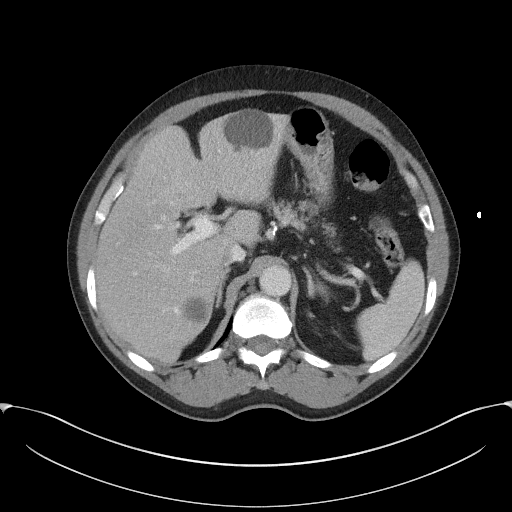
[im 93/111  soft-tissue]
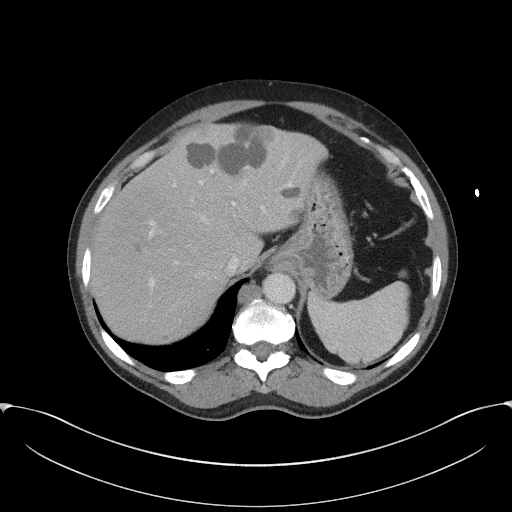
[im 105/111  soft-tissue]
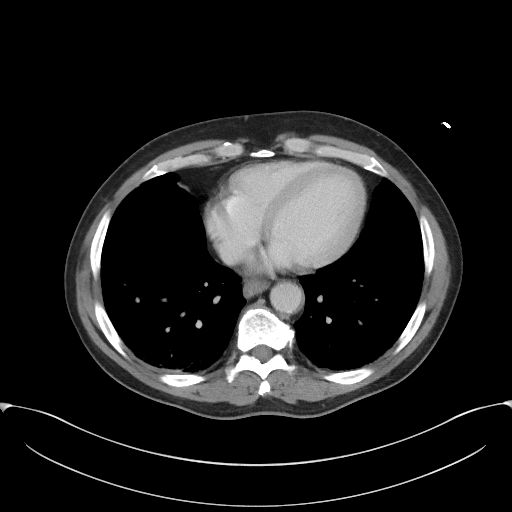

[Series 5: coronal st · coronal · 0.81mm/px · 3 of 105 slices shown]
[im 35/105  soft-tissue]
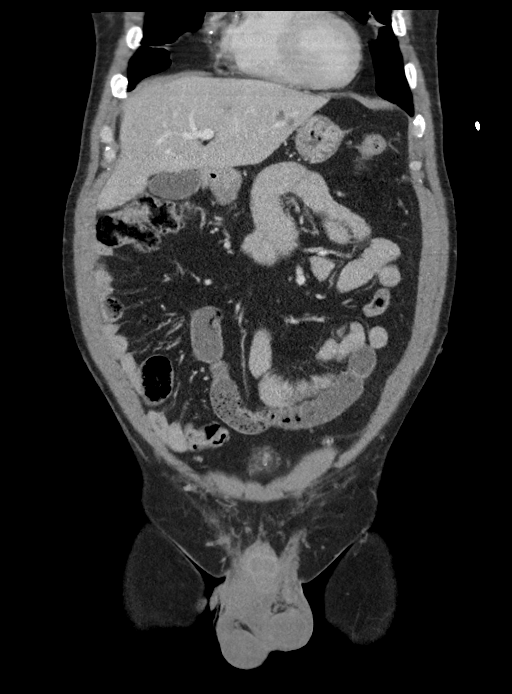
[im 47/105  soft-tissue]
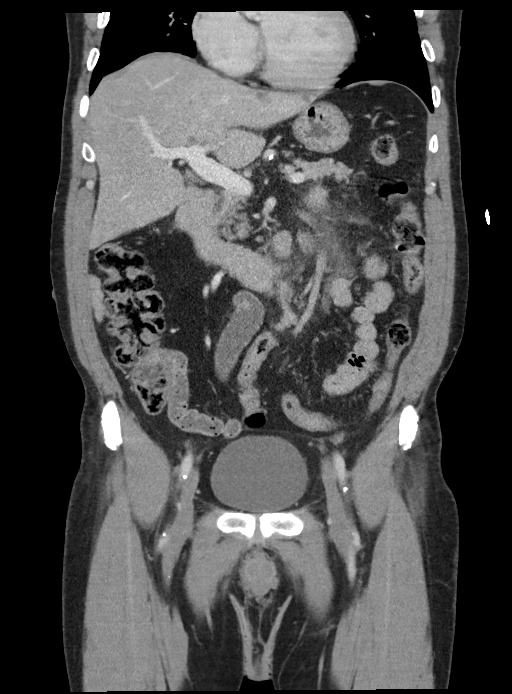
[im 58/105  soft-tissue]
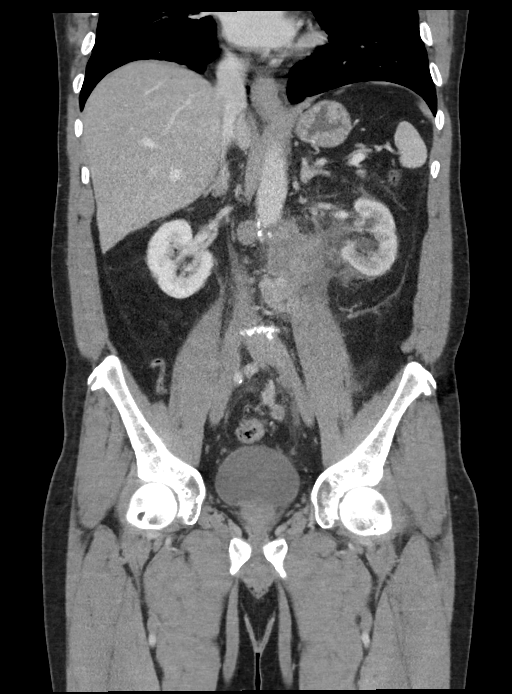

[15 of 46 positions shown; findings below may reference images not displayed]

RADIATION DOSE REDUCTION: This exam was performed according to the
departmental dose-optimization program which includes automated
exposure control, adjustment of the mA and/or kV according to
patient size and/or use of iterative reconstruction technique.

CONTRAST:  75mL OMNIPAQUE IOHEXOL 300 MG/ML  SOLN
FINDINGS: Lower chest: Bibasilar subpleural atelectasis. Two small nodules in
the visualized right lung measure up to 5 mm, and indeterminate but
concerning for metastatic disease.

No intra-abdominal free air or free fluid.

Hepatobiliary: There is fatty infiltration of the liver. Multiple
liver cysts measure up to 5 cm in the left lobe. Several additional
smaller hypodense lesions are not characterized but concerning for
metastatic disease. No intrahepatic biliary ductal dilatation. The
gallbladder is unremarkable.

Pancreas: Unremarkable. No pancreatic ductal dilatation or
surrounding inflammatory changes.

Spleen: Normal in size without focal abnormality.

Adrenals/Urinary Tract: The adrenal glands unremarkable. The right
kidney is unremarkable. There is mild left hydronephrosis with
delayed excretion of contrast from the left kidney. There is
obstruction of the proximal left ureter secondary to a 2 cm soft
tissue mass which may represent a primary urothelial neoplasm or an
enlarged/metastatic retroperitoneal lymph node. The distal left
ureter and urinary bladder appear unremarkable.

Stomach/Bowel: There is no bowel obstruction or active inflammation.
The appendix is normal.

Vascular/Lymphatic: Advanced calcified and noncalcified
atherosclerotic plaque of the abdominal aorta. The IVC is
unremarkable. No portal venous gas. Retroperitoneal adenopathy
primarily involving the left para-aortic region at the level of the
left renal artery and vein. There is additionally para caval
adenopathy. Findings consistent with metastatic disease likely
originating from genitourinary system.

Reproductive: The prostate and seminal vesicles are grossly
unremarkable.

Other: None

Musculoskeletal: Degenerative changes of the spine. No acute osseous
pathology. No suspicious bone lesions.
IMPRESSION: 1. Mild left hydronephrosis secondary to obstruction of the proximal
left ureter by a 2 cm soft tissue mass which may represent a primary
urothelial neoplasm or an enlarged/metastatic retroperitoneal lymph
node.
2. Retroperitoneal adenopathy consistent with metastatic disease
likely secondary to genitourinary neoplasm. Clinical correlation is
recommended.
3. Right lung nodules as well as several small hepatic hypodense
lesions concerning for metastatic disease.
4. Fatty liver.
5. Aortic Atherosclerosis ([id number]).
